# Patient Record
Sex: Female | Born: 1980 | Hispanic: Yes | Marital: Married | Smoking: Never smoker
Health system: Southern US, Community
[De-identification: ages and names within clinical notes are randomized; demographics above are authoritative.]

## PROBLEM LIST (undated history)

## (undated) ENCOUNTER — Inpatient Hospital Stay (HOSPITAL_COMMUNITY): Payer: Self-pay

## (undated) DIAGNOSIS — O139 Gestational [pregnancy-induced] hypertension without significant proteinuria, unspecified trimester: Secondary | ICD-10-CM

## (undated) DIAGNOSIS — I10 Essential (primary) hypertension: Secondary | ICD-10-CM

## (undated) DIAGNOSIS — O24419 Gestational diabetes mellitus in pregnancy, unspecified control: Secondary | ICD-10-CM

## (undated) HISTORY — PX: NO PAST SURGERIES: SHX2092

---

## 2006-09-05 ENCOUNTER — Inpatient Hospital Stay (HOSPITAL_COMMUNITY): Admission: AD | Admit: 2006-09-05 | Discharge: 2006-09-07 | Payer: Self-pay | Admitting: Gynecology

## 2008-03-09 ENCOUNTER — Inpatient Hospital Stay (HOSPITAL_COMMUNITY): Admission: AD | Admit: 2008-03-09 | Discharge: 2008-03-09 | Payer: Self-pay | Admitting: Obstetrics & Gynecology

## 2008-03-09 ENCOUNTER — Ambulatory Visit: Payer: Self-pay | Admitting: Obstetrics and Gynecology

## 2008-03-17 ENCOUNTER — Ambulatory Visit: Payer: Self-pay | Admitting: Obstetrics & Gynecology

## 2009-12-21 ENCOUNTER — Inpatient Hospital Stay (HOSPITAL_COMMUNITY)
Admission: RE | Admit: 2009-12-21 | Discharge: 2009-12-23 | Payer: Self-pay | Source: Home / Self Care | Admitting: Obstetrics

## 2010-04-17 LAB — CBC
HCT: 37.2 % (ref 36.0–46.0)
MCH: 26.9 pg (ref 26.0–34.0)
MCH: 27.3 pg (ref 26.0–34.0)
MCHC: 32.9 g/dL (ref 30.0–36.0)
MCHC: 33.2 g/dL (ref 30.0–36.0)
MCV: 81.8 fL (ref 78.0–100.0)
MCV: 82.2 fL (ref 78.0–100.0)
Platelets: 230 10*3/uL (ref 150–400)
RBC: 4.11 MIL/uL (ref 3.87–5.11)
RDW: 15.1 % (ref 11.5–15.5)
WBC: 8.9 10*3/uL (ref 4.0–10.5)

## 2010-05-22 LAB — URINALYSIS, ROUTINE W REFLEX MICROSCOPIC
Bilirubin Urine: NEGATIVE
Ketones, ur: NEGATIVE mg/dL
Nitrite: NEGATIVE
Protein, ur: NEGATIVE mg/dL
pH: 6 (ref 5.0–8.0)

## 2010-05-22 LAB — WET PREP, GENITAL: Clue Cells Wet Prep HPF POC: NONE SEEN

## 2010-05-22 LAB — CBC
HCT: 40 % (ref 36.0–46.0)
MCV: 85 fL (ref 78.0–100.0)
Platelets: 279 10*3/uL (ref 150–400)
RDW: 13.6 % (ref 11.5–15.5)

## 2010-05-22 LAB — POCT PREGNANCY, URINE: Preg Test, Ur: NEGATIVE

## 2010-05-22 LAB — GC/CHLAMYDIA PROBE AMP, GENITAL: Chlamydia, DNA Probe: NEGATIVE

## 2010-05-22 LAB — URINE MICROSCOPIC-ADD ON

## 2010-11-19 LAB — CBC
Platelets: 209
RBC: 3.69 — ABNORMAL LOW
RDW: 14.8 — ABNORMAL HIGH
WBC: 16.7 — ABNORMAL HIGH

## 2013-02-04 NOTE — L&D Delivery Note (Signed)
Delivery Note At 11:19 PM a viable female was delivered via  (Presentation: ;  ).  APGAR: , ; weight .   Placenta status: Intact, Manual removal.  Cord:  with the following complications: .  Cord pH: not done  Anesthesia: Epidural  Episiotomy:  Lacerations:  Suture Repair: 2.0 Est. Blood Loss (mL):   Mom to postpartum.  Baby to Couplet care / Skin to Skin.  MARSHALL,BERNARD A 11/02/2013, 11:31 PM

## 2013-05-08 ENCOUNTER — Encounter (HOSPITAL_COMMUNITY): Payer: Self-pay | Admitting: *Deleted

## 2013-05-08 ENCOUNTER — Inpatient Hospital Stay (HOSPITAL_COMMUNITY)
Admission: AD | Admit: 2013-05-08 | Discharge: 2013-05-09 | Disposition: A | Discharge status: Home / Self Care | Payer: Medicaid Other | Source: Ambulatory Visit | Attending: Obstetrics | Admitting: Obstetrics

## 2013-05-08 DIAGNOSIS — O2390 Unspecified genitourinary tract infection in pregnancy, unspecified trimester: Secondary | ICD-10-CM | POA: Insufficient documentation

## 2013-05-08 DIAGNOSIS — H9209 Otalgia, unspecified ear: Secondary | ICD-10-CM | POA: Insufficient documentation

## 2013-05-08 DIAGNOSIS — B373 Candidiasis of vulva and vagina: Secondary | ICD-10-CM

## 2013-05-08 DIAGNOSIS — B3731 Acute candidiasis of vulva and vagina: Secondary | ICD-10-CM | POA: Insufficient documentation

## 2013-05-08 DIAGNOSIS — H9202 Otalgia, left ear: Secondary | ICD-10-CM

## 2013-05-08 NOTE — MAU Note (Signed)
Pt reports vaginal itching, white discharge. Pain with urination.

## 2013-05-09 ENCOUNTER — Encounter (HOSPITAL_COMMUNITY): Payer: Self-pay | Admitting: *Deleted

## 2013-05-09 LAB — URINE MICROSCOPIC-ADD ON

## 2013-05-09 LAB — WET PREP, GENITAL: TRICH WET PREP: NONE SEEN

## 2013-05-09 LAB — URINALYSIS, ROUTINE W REFLEX MICROSCOPIC
BILIRUBIN URINE: NEGATIVE
Glucose, UA: NEGATIVE mg/dL
KETONES UR: NEGATIVE mg/dL
NITRITE: NEGATIVE
Protein, ur: 30 mg/dL — AB
Specific Gravity, Urine: 1.025 (ref 1.005–1.030)
UROBILINOGEN UA: 0.2 mg/dL (ref 0.0–1.0)
pH: 6 (ref 5.0–8.0)

## 2013-05-09 LAB — OB RESULTS CONSOLE GC/CHLAMYDIA
Chlamydia: NEGATIVE
GC PROBE AMP, GENITAL: NEGATIVE

## 2013-05-09 MED ORDER — FLUCONAZOLE 150 MG PO TABS
150.0000 mg | ORAL_TABLET | Freq: Every day | ORAL | Status: DC
  Administered 2013-05-09: 150 mg via ORAL
  Filled 2013-05-09: qty 1

## 2013-05-09 MED ORDER — FLUCONAZOLE 150 MG PO TABS: 150.0000 mg | ORAL_TABLET | Freq: Every day | ORAL | Status: DC

## 2013-05-09 NOTE — Discharge Instructions (Signed)
Infección por cándida en adultos  (Candida Infection, Adult)  Una infección por Cándida (también llamado infección por hongos levaduriformes, o infección por Monilia) es un crecimiento excesivo de hongos que puede ocurrir en cualquier parte del cuerpo. Una infección por cándida comúnmente ocurre en las zonas más calientes y húmedas del cuerpo. Usualmente, la infección permanece localizada pero puede diseminarse y convertirse en una infección sistémica. Una infección por cándida puede ser signo de un trastorno más grave como diabetes, leucemia, o SIDA.  Una infección por cándida puede ocurrir tanto en hombres como en mujeres. En mujeres, la vaginitis Cándida es una infección vaginal. Se trata de una de las causas más frecuentes de la vaginitis. Los hombres no suelen tener síntomas hasta que se le aparecen otros problemas. Pueden descubrir que tienen una infección por hongos porque su compañera sexual los tiene. Es más probable que los hombres no circuncisos adquieran una infección por hongo que aquellos que están circuncindados. Esto se debe a que el glande no circuncidado no está expuesto al aire y no se mantiene tan seco como un glande circuncidado. Los adultos mayores pueden desarrollar infecciones por cándida en la zona de la dentadura.  CAUSAS  Mujeres  · Antibióticos.  · Medicamento con esteroides durante mucho tiempo.  · Tener sobrepeso (obesidad).  · Diabetes.  · Sistema inmune deficiente.  · Ciertas enfermedades.  · Medicamentos inmunosupresores para pacientes con trasplante de órganos.  · Quimioterapia.  · El embarazo.  · Menstruación.  · Estrés o fatiga.  · Consumo de drogas de forma intravenosa.  · Anticonceptivos orales.  · Utilizar ropa ajustada en la zona de la entrepierna.  · Contagio a partir de un compañero sexual que ya tiene la infección.  · Los espermicidas.  · Catéteres intravenosos, urinarios, u de otro tipo.  Hombres  · Contagio a partir de una compañera sexual que ya tiene la  infección.  · Tener sexo oral o anal con una persona que tiene la infección.  · Los espermicidas.  · Diabetes.  · Antibióticos.  · Sistema inmune deficiente.  · Medicamentos que suprimen el sistema inmune.  · El uso de drogas por vía intravenosa.  · Intravenosa, urinarias o catéteres otros.  SÍNTOMAS  Mujeres  · Flujo vaginal espeso y blanco.  · Picazón vaginal.  · Enrojecimiento e hinchazón en la zona de la vagina.  · Irritación de los labios de la vagina y perineo.  · Úlceras en labios vaginales y perineo.  · Relaciones sexuales dolorosas.  · Bajo nivel de azúcar en la sangre (hipoglucemia).  · Dolor al orinar.  · Infecciones en la vejiga.  · Problemas intestinales como constipación, indigestión, mal aliento, hinchazón, gases, diarrea o heces blandas.  Hombres  · Primero los hombres desarrollan problemas intestinales como constipación, indigestión, mal aliento, hinchazón, gases, diarrea o heces blandas.  · Piel del pene seca y quebradiza con picazón o molestias.  · Prurito de la ingle.  · Piel seca y escamosa.  · Pie de atleta.  · Hipoglucemia.  DIAGNÓSTICO  Mujeres  · Se revisa el historial y se realiza un análisis.  · La supuración se observa con un microscopio.  · Deberá tomarse un cultivo de la secreción.  Hombres  · Se revisa el historial y se realiza un análisis.  · Se observará cualquier secreción proveniente del pene y la piel quebrada en el microscopio y se realizará un cultivo.  · Podrán realizarle cultivos de heces.  TRATAMIENTO  Mujeres  · Supositorios y cremas   antifúngicos vaginales.  · Cremas medicadas para disminuir la picazón e irritación de la zona externa de la vagina.  · Aplique una bolsa caliente en la zona del perineo para disminuir molestias o inflamación.  · Medicamentos antifúngicos orales.  · Supositorios vaginales o cremas medicinales, para infecciones repetidas o recurrentes.  · Lave y seque la zona por completo antes de aplicar la crema.  · La ingesta de yogur con lactobacillus le  ayudará con la prevención y el tratamiento.  · Si las cremas y supositorios no funcionan, la aplicación de solución violeta de genciana puede ayudar.  Hombres  · Cremas anti hongos y medicamentos orales anti hongos.  · A veces el tratamiento deberá continuar por 30 días después de que los síntomas desaparecen para prevenir la recurrencia.  INSTRUCCIONES PARA EL CUIDADO DOMICILIARIO  Mujeres  · Utilice ropa interior de algodón y evite las ropas ajustadas.  · Evite el papel higiénico de color o perfumado y los tampones o toallitas con desodorante.  · No utilice duchas vaginales.  · Mantenga la diabetes bajo control.  · Tome todos los medicamentos tal como se le indicó.  · Mantenga su piel limpia y seca.  · Consuma leche o yogur con lactobacillus de manera regular. Si contrae infecciones por levaduras frecuentes y cree saber cuál es la infección, existen medicamentos de venta libre. Si la infección no parece curarse en 3 días, hable con el médico.  · Comunique a su compañero sexual que padece una infección por hongos. El también necesitará tratamiento, en especial si la infección no desaparece o es recurrente.  Hombres  · Mantenga su piel limpia y seca.  · Mantenga la diabetes bajo control.  · Tome todos los medicamentos tal como se le indicó.  · Comunique a su compañera sexual que sufre una infección por hongos.  SOLICITE ANTENCIÓN MÉDICA SI:  · Los síntomas no desaparecen o empeoran luego de 1 semana de tratamiento.  · Usted tiene una temperatura oral de más de 38,9° C (102° F).  · Presenta dificultades para tragar o para comer durante un tiempo prolongado.  · Aparecen ampollas en los genitales.  · Si aparece una hemorragia vaginal y no es el momento del período.  · Siente dolor abdominal.  · Presentara problemas intestinales como los ya descritos.  · Si se siente débil o desfalleciente.  · Siente dolor al orinar u observa una mayor cantidad de orina.  · Tiene dolor durante las relaciones sexuales.  ASEGÚRESE DE  QUE:  · Comprende esas instrucciones para el alta médica.  · Controlará su enfermedad.  · Pedirá ayuda de inmediato si no mejora o empeora.  Document Released: 01/21/2005 Document Revised: 04/15/2011  ExitCare® Patient Information ©2014 [employer], LLC.

## 2013-05-09 NOTE — MAU Provider Note (Signed)
History     CSN: [id number]  Arrival date and time: 05/08/13 2326   First Provider Initiated Contact with Patient 05/09/13 0055      Chief Complaint  Patient presents with  . Vaginal Discharge  . Vaginal Itching  . Dysuria  . Otalgia   HPI  Ms. Nicole Parker is a 33 y.o. female [id number] at 75106w0d who present with vaginal irritation, vaginal itching, increased white discharge and burning during urination. The symptoms have been going on for 3-4 days. She denies vaginal bleeding currently, is receiving care with Dr. Gaynell FaceMarshall.    OB History   Grav Para Term Preterm Abortions TAB SAB Ect Mult Living   5 3   1   1  3       Past Medical History  Diagnosis Date  . Medical history non-contributory     Past Surgical History  Procedure Laterality Date  . No past surgeries      No family history on file.  History  Substance Use Topics  . Smoking status: Never Smoker   . Smokeless tobacco: Not on file  . Alcohol Use: No    Allergies: No Known Allergies  Prescriptions prior to admission  Medication Sig Dispense Refill  . Prenatal Vit-Fe Fumarate-FA (MULTIVITAMIN-PRENATAL) 27-0.8 MG TABS tablet Take 1 tablet by mouth daily at 12 noon.       Results for orders placed during the hospital encounter of 05/08/13 (from the past 48 hour(s))  URINALYSIS, ROUTINE W REFLEX MICROSCOPIC     Status: Abnormal   Collection Time    05/08/13 11:45 PM      Result Value Ref Range   Color, Urine YELLOW  YELLOW   APPearance HAZY (*) CLEAR   Specific Gravity, Urine 1.025  1.005 - 1.030   pH 6.0  5.0 - 8.0   Glucose, UA NEGATIVE  NEGATIVE mg/dL   Hgb urine dipstick SMALL (*) NEGATIVE   Bilirubin Urine NEGATIVE  NEGATIVE   Ketones, ur NEGATIVE  NEGATIVE mg/dL   Protein, ur 30 (*) NEGATIVE mg/dL   Urobilinogen, UA 0.2  0.0 - 1.0 mg/dL   Nitrite NEGATIVE  NEGATIVE   Leukocytes, UA MODERATE (*) NEGATIVE  URINE MICROSCOPIC-ADD ON     Status: Abnormal   Collection Time     05/08/13 11:45 PM      Result Value Ref Range   Squamous Epithelial / LPF MANY (*) RARE   WBC, UA 3-6  <3 WBC/hpf   RBC / HPF 3-6  <3 RBC/hpf   Bacteria, UA MANY (*) RARE   Urine-Other MUCOUS PRESENT     Comment: YEAST  WET PREP, GENITAL     Status: Abnormal   Collection Time    05/09/13 12:55 AM      Result Value Ref Range   Yeast Wet Prep HPF POC FEW (*) NONE SEEN   Trich, Wet Prep NONE SEEN  NONE SEEN   Clue Cells Wet Prep HPF POC FEW (*) NONE SEEN   WBC, Wet Prep HPF POC MODERATE (*) NONE SEEN   Comment: MANY BACTERIA SEEN    Review of Systems  Gastrointestinal: Negative for nausea, vomiting and abdominal pain.  Genitourinary: Positive for dysuria.       + vaginal discharge thick, white  No vaginal bleeding. No dysuria.    Physical Exam   Blood pressure 152/92, pulse 93, temperature 98.6 F (37 C), temperature source Oral, height 5\' 2"  (1.575 m), weight 106.142 kg (234 lb), SpO2 98.00%.  Physical  Exam  Constitutional: She is oriented to person, place, and time. She appears well-developed and well-nourished. No distress.  HENT:  Head: Normocephalic.  Left Ear: Tympanic membrane normal. Tympanic membrane is not erythematous and not bulging.  Eyes: Pupils are equal, round, and reactive to light.  Neck: Neck supple.  GI: Soft.  Genitourinary:  Speculum exam: Vagina - large amount of thick, white, clumpy discharge, no odor, erythema noted along vaginal wall and cervix Cervix - No contact bleeding Bimanual exam: Cervix closed, no CMT  Uterus non tender, normal size Adnexa non tender, no masses bilaterally GC/Chlam, wet prep done Chaperone present for exam.   Neurological: She is alert and oriented to person, place, and time.  Skin: Skin is warm. She is not diaphoretic.  Psychiatric: Her behavior is normal.    MAU Course  Procedures None  MDM +fht Wet prep GC Diflucan 150 mg given in MAU   Assessment and Plan   A:  Yeast vaginitis in  pregnancy Otalgia   P: Discharge home in stable condition RX: Diflucan to take in 3 days.  Follow up with Dr. Gaynell Face Pelvic rest until yeast infection is gone.   Nicole Hansen [name], NP  05/09/2013, 12:56 AM

## 2013-05-10 LAB — URINE CULTURE

## 2013-05-10 LAB — GC/CHLAMYDIA PROBE AMP
CT PROBE, AMP APTIMA: NEGATIVE
GC Probe RNA: NEGATIVE

## 2013-06-29 ENCOUNTER — Encounter (HOSPITAL_COMMUNITY): Payer: Self-pay

## 2013-06-29 ENCOUNTER — Inpatient Hospital Stay (HOSPITAL_COMMUNITY)
Admission: AD | Admit: 2013-06-29 | Discharge: 2013-06-29 | Disposition: A | Discharge status: Home / Self Care | Payer: Medicaid Other | Source: Ambulatory Visit | Attending: Obstetrics | Admitting: Obstetrics

## 2013-06-29 DIAGNOSIS — B379 Candidiasis, unspecified: Secondary | ICD-10-CM

## 2013-06-29 DIAGNOSIS — B373 Candidiasis of vulva and vagina: Secondary | ICD-10-CM | POA: Insufficient documentation

## 2013-06-29 DIAGNOSIS — B3731 Acute candidiasis of vulva and vagina: Secondary | ICD-10-CM | POA: Insufficient documentation

## 2013-06-29 DIAGNOSIS — R300 Dysuria: Secondary | ICD-10-CM | POA: Insufficient documentation

## 2013-06-29 LAB — WET PREP, GENITAL
Clue Cells Wet Prep HPF POC: NONE SEEN
Trich, Wet Prep: NONE SEEN

## 2013-06-29 LAB — URINALYSIS, ROUTINE W REFLEX MICROSCOPIC
BILIRUBIN URINE: NEGATIVE
Glucose, UA: 500 mg/dL — AB
Hgb urine dipstick: NEGATIVE
KETONES UR: NEGATIVE mg/dL
NITRITE: NEGATIVE
PH: 6 (ref 5.0–8.0)
Protein, ur: NEGATIVE mg/dL
SPECIFIC GRAVITY, URINE: 1.015 (ref 1.005–1.030)
UROBILINOGEN UA: 0.2 mg/dL (ref 0.0–1.0)

## 2013-06-29 LAB — URINE MICROSCOPIC-ADD ON

## 2013-06-29 MED ORDER — FLUCONAZOLE 150 MG PO TABS
150.0000 mg | ORAL_TABLET | Freq: Once | ORAL | Status: AC
  Administered 2013-06-29: 150 mg via ORAL
  Filled 2013-06-29: qty 1

## 2013-06-29 MED ORDER — FLUCONAZOLE 150 MG PO TABS: 150.0000 mg | ORAL_TABLET | Freq: Once | ORAL | Status: DC

## 2013-06-29 NOTE — MAU Provider Note (Signed)
History     CSN: [id number]  Arrival date and time: 06/29/13 3568   First Provider Initiated Contact with Patient 06/29/13 2032      Chief Complaint  Patient presents with  . Dysuria   Dysuria     Nicole Parker is a 33 y.o. [phone] at [redacted]w[redacted]d who presents today with vaginal irritation and burning . She states that she has had the irritation for about 4 days. She states that when she urinates she has increased vaginal burning, and rates that pain 8/10. She was treated for a yeast infection "a couple of months ago". She confirms fetal movement, and denies any UCs.  Past Medical History  Diagnosis Date  . Medical history non-contributory     Past Surgical History  Procedure Laterality Date  . No past surgeries      History reviewed. No pertinent family history.  History  Substance Use Topics  . Smoking status: Never Smoker   . Smokeless tobacco: Never Used  . Alcohol Use: No    Allergies: No Known Allergies  Prescriptions prior to admission  Medication Sig Dispense Refill  . Prenatal Vit-Fe Fumarate-FA (MULTIVITAMIN-PRENATAL) 27-0.8 MG TABS tablet Take 1 tablet by mouth daily at 12 noon.      . fluconazole (DIFLUCAN) 150 MG tablet Take 1 tablet (150 mg total) by mouth daily.  1 tablet  1    Review of Systems  Genitourinary: Positive for dysuria.   Physical Exam   Blood pressure 132/82, pulse 91, temperature 98.7 F (37.1 C), temperature source Oral, resp. rate 20, height 5\' 3"  (1.6 m), weight 110.496 kg (243 lb 9.6 oz).  Physical Exam  Nursing note and vitals reviewed. Constitutional: She is oriented to person, place, and time. She appears well-developed and well-nourished. No distress.  Cardiovascular: Normal rate.   Respiratory: Effort normal.  GI: Soft. Bowel sounds are normal. There is no tenderness.  Genitourinary:   External: no lesion Vagina: small amount of clumpy, white, adherent discharge.  Cervix: pink, smooth, no CMT Uterus: AGA,  FHT with doppler    Neurological: She is alert and oriented to person, place, and time.  Skin: Skin is warm and dry.  Psychiatric: She has a normal mood and affect.    MAU Course  Procedures  Results for orders placed during the hospital encounter of 06/29/13 (from the past 24 hour(s))  URINALYSIS, ROUTINE W REFLEX MICROSCOPIC     Status: Abnormal   Collection Time    06/29/13  7:25 PM      Result Value Ref Range   Color, Urine YELLOW  YELLOW   APPearance CLEAR  CLEAR   Specific Gravity, Urine 1.015  1.005 - 1.030   pH 6.0  5.0 - 8.0   Glucose, UA 500 (*) NEGATIVE mg/dL   Hgb urine dipstick NEGATIVE  NEGATIVE   Bilirubin Urine NEGATIVE  NEGATIVE   Ketones, ur NEGATIVE  NEGATIVE mg/dL   Protein, ur NEGATIVE  NEGATIVE mg/dL   Urobilinogen, UA 0.2  0.0 - 1.0 mg/dL   Nitrite NEGATIVE  NEGATIVE   Leukocytes, UA SMALL (*) NEGATIVE  URINE MICROSCOPIC-ADD ON     Status: None   Collection Time    06/29/13  7:25 PM      Result Value Ref Range   Squamous Epithelial / LPF RARE  RARE   WBC, UA 0-2  <3 WBC/hpf   RBC / HPF 0-2  <3 RBC/hpf   Bacteria, UA RARE  RARE   Urine-Other AMORPHOUS URATES/PHOSPHATES  WET PREP, GENITAL     Status: Abnormal   Collection Time    06/29/13  9:20 PM      Result Value Ref Range   Yeast Wet Prep HPF POC FEW (*) NONE SEEN   Trich, Wet Prep NONE SEEN  NONE SEEN   Clue Cells Wet Prep HPF POC NONE SEEN  NONE SEEN   WBC, Wet Prep HPF POC MANY (*) NONE SEEN     Assessment and Plan   1. Yeast infection    Treated with diflucan here in MAU 2nd trimester danger signs reviewed Return to MAU as needed  Follow-up Information   Follow up with Kathreen CosierMARSHALL,BERNARD A, MD. (As scheduled)    Specialty:  Obstetrics and Gynecology   Contact information:   [address] [place] [id number] [phone]       [name] 06/29/2013, 9:24 PM

## 2013-06-29 NOTE — MAU Note (Signed)
Pt having pain with urination x 4 days. Denies bleeding or discharge.  Pt G5 P3.

## 2013-06-29 NOTE — Discharge Instructions (Signed)
Infección por cándida en adultos  (Candida Infection, Adult)  Una infección por Cándida (también llamado infección por hongos levaduriformes, o infección por Monilia) es un crecimiento excesivo de hongos que puede ocurrir en cualquier parte del cuerpo. Una infección por cándida comúnmente ocurre en las zonas más calientes y húmedas del cuerpo. Usualmente, la infección permanece localizada pero puede diseminarse y convertirse en una infección sistémica. Una infección por cándida puede ser signo de un trastorno más grave como diabetes, leucemia, o SIDA.  Una infección por cándida puede ocurrir tanto en hombres como en mujeres. En mujeres, la vaginitis Cándida es una infección vaginal. Se trata de una de las causas más frecuentes de la vaginitis. Los hombres no suelen tener síntomas hasta que se le aparecen otros problemas. Pueden descubrir que tienen una infección por hongos porque su compañera sexual los tiene. Es más probable que los hombres no circuncisos adquieran una infección por hongo que aquellos que están circuncindados. Esto se debe a que el glande no circuncidado no está expuesto al aire y no se mantiene tan seco como un glande circuncidado. Los adultos mayores pueden desarrollar infecciones por cándida en la zona de la dentadura.  CAUSAS  Mujeres  · Antibióticos.  · Medicamento con esteroides durante mucho tiempo.  · Tener sobrepeso (obesidad).  · Diabetes.  · Sistema inmune deficiente.  · Ciertas enfermedades.  · Medicamentos inmunosupresores para pacientes con trasplante de órganos.  · Quimioterapia.  · El embarazo.  · Menstruación.  · Estrés o fatiga.  · Consumo de drogas de forma intravenosa.  · Anticonceptivos orales.  · Utilizar ropa ajustada en la zona de la entrepierna.  · Contagio a partir de un compañero sexual que ya tiene la infección.  · Los espermicidas.  · Catéteres intravenosos, urinarios, u de otro tipo.  Hombres  · Contagio a partir de una compañera sexual que ya tiene la  infección.  · Tener sexo oral o anal con una persona que tiene la infección.  · Los espermicidas.  · Diabetes.  · Antibióticos.  · Sistema inmune deficiente.  · Medicamentos que suprimen el sistema inmune.  · El uso de drogas por vía intravenosa.  · Intravenosa, urinarias o catéteres otros.  SÍNTOMAS  Mujeres  · Flujo vaginal espeso y blanco.  · Picazón vaginal.  · Enrojecimiento e hinchazón en la zona de la vagina.  · Irritación de los labios de la vagina y perineo.  · Úlceras en labios vaginales y perineo.  · Relaciones sexuales dolorosas.  · Bajo nivel de azúcar en la sangre (hipoglucemia).  · Dolor al orinar.  · Infecciones en la vejiga.  · Problemas intestinales como constipación, indigestión, mal aliento, hinchazón, gases, diarrea o heces blandas.  Hombres  · Primero los hombres desarrollan problemas intestinales como constipación, indigestión, mal aliento, hinchazón, gases, diarrea o heces blandas.  · Piel del pene seca y quebradiza con picazón o molestias.  · Prurito de la ingle.  · Piel seca y escamosa.  · Pie de atleta.  · Hipoglucemia.  DIAGNÓSTICO  Mujeres  · Se revisa el historial y se realiza un análisis.  · La supuración se observa con un microscopio.  · Deberá tomarse un cultivo de la secreción.  Hombres  · Se revisa el historial y se realiza un análisis.  · Se observará cualquier secreción proveniente del pene y la piel quebrada en el microscopio y se realizará un cultivo.  · Podrán realizarle cultivos de heces.  TRATAMIENTO  Mujeres  · Supositorios y cremas   antifúngicos vaginales.  · Cremas medicadas para disminuir la picazón e irritación de la zona externa de la vagina.  · Aplique una bolsa caliente en la zona del perineo para disminuir molestias o inflamación.  · Medicamentos antifúngicos orales.  · Supositorios vaginales o cremas medicinales, para infecciones repetidas o recurrentes.  · Lave y seque la zona por completo antes de aplicar la crema.  · La ingesta de yogur con lactobacillus le  ayudará con la prevención y el tratamiento.  · Si las cremas y supositorios no funcionan, la aplicación de solución violeta de genciana puede ayudar.  Hombres  · Cremas anti hongos y medicamentos orales anti hongos.  · A veces el tratamiento deberá continuar por 30 días después de que los síntomas desaparecen para prevenir la recurrencia.  INSTRUCCIONES PARA EL CUIDADO DOMICILIARIO  Mujeres  · Utilice ropa interior de algodón y evite las ropas ajustadas.  · Evite el papel higiénico de color o perfumado y los tampones o toallitas con desodorante.  · No utilice duchas vaginales.  · Mantenga la diabetes bajo control.  · Tome todos los medicamentos tal como se le indicó.  · Mantenga su piel limpia y seca.  · Consuma leche o yogur con lactobacillus de manera regular. Si contrae infecciones por levaduras frecuentes y cree saber cuál es la infección, existen medicamentos de venta libre. Si la infección no parece curarse en 3 días, hable con el médico.  · Comunique a su compañero sexual que padece una infección por hongos. El también necesitará tratamiento, en especial si la infección no desaparece o es recurrente.  Hombres  · Mantenga su piel limpia y seca.  · Mantenga la diabetes bajo control.  · Tome todos los medicamentos tal como se le indicó.  · Comunique a su compañera sexual que sufre una infección por hongos.  SOLICITE ANTENCIÓN MÉDICA SI:  · Los síntomas no desaparecen o empeoran luego de 1 semana de tratamiento.  · Usted tiene una temperatura oral de más de 38,9° C (102° F).  · Presenta dificultades para tragar o para comer durante un tiempo prolongado.  · Aparecen ampollas en los genitales.  · Si aparece una hemorragia vaginal y no es el momento del período.  · Siente dolor abdominal.  · Presentara problemas intestinales como los ya descritos.  · Si se siente débil o desfalleciente.  · Siente dolor al orinar u observa una mayor cantidad de orina.  · Tiene dolor durante las relaciones sexuales.  ASEGÚRESE DE  QUE:  · Comprende esas instrucciones para el alta médica.  · Controlará su enfermedad.  · Pedirá ayuda de inmediato si no mejora o empeora.  Document Released: 01/21/2005 Document Revised: 04/15/2011  ExitCare® Patient Information ©2014 [employer], LLC.

## 2013-06-30 LAB — GC/CHLAMYDIA PROBE AMP
CT Probe RNA: NEGATIVE
GC PROBE AMP APTIMA: NEGATIVE

## 2013-06-30 LAB — URINE CULTURE
COLONY COUNT: NO GROWTH
Culture: NO GROWTH

## 2013-06-30 LAB — GLUCOSE, CAPILLARY: GLUCOSE-CAPILLARY: 111 mg/dL — AB (ref 70–99)

## 2013-07-17 ENCOUNTER — Inpatient Hospital Stay (HOSPITAL_COMMUNITY): Payer: Medicaid Other

## 2013-07-17 ENCOUNTER — Encounter (HOSPITAL_COMMUNITY): Payer: Self-pay | Admitting: *Deleted

## 2013-07-17 ENCOUNTER — Inpatient Hospital Stay (HOSPITAL_COMMUNITY)
Admission: AD | Admit: 2013-07-17 | Discharge: 2013-07-17 | Disposition: A | Discharge status: Home / Self Care | Payer: Medicaid Other | Source: Ambulatory Visit | Attending: Obstetrics | Admitting: Obstetrics

## 2013-07-17 DIAGNOSIS — K5900 Constipation, unspecified: Secondary | ICD-10-CM | POA: Insufficient documentation

## 2013-07-17 DIAGNOSIS — N949 Unspecified condition associated with female genital organs and menstrual cycle: Secondary | ICD-10-CM

## 2013-07-17 DIAGNOSIS — O99891 Other specified diseases and conditions complicating pregnancy: Secondary | ICD-10-CM | POA: Insufficient documentation

## 2013-07-17 DIAGNOSIS — O9989 Other specified diseases and conditions complicating pregnancy, childbirth and the puerperium: Principal | ICD-10-CM

## 2013-07-17 DIAGNOSIS — R109 Unspecified abdominal pain: Secondary | ICD-10-CM | POA: Insufficient documentation

## 2013-07-17 LAB — URINALYSIS, ROUTINE W REFLEX MICROSCOPIC
BILIRUBIN URINE: NEGATIVE
GLUCOSE, UA: NEGATIVE mg/dL
KETONES UR: NEGATIVE mg/dL
LEUKOCYTES UA: NEGATIVE
NITRITE: NEGATIVE
Protein, ur: NEGATIVE mg/dL
SPECIFIC GRAVITY, URINE: 1.02 (ref 1.005–1.030)
Urobilinogen, UA: 0.2 mg/dL (ref 0.0–1.0)
pH: 6 (ref 5.0–8.0)

## 2013-07-17 LAB — URINE MICROSCOPIC-ADD ON

## 2013-07-17 MED ORDER — CYCLOBENZAPRINE HCL 10 MG PO TABS
10.0000 mg | ORAL_TABLET | Freq: Once | ORAL | Status: AC
  Administered 2013-07-17: 10 mg via ORAL
  Filled 2013-07-17: qty 1

## 2013-07-17 MED ORDER — OXYCODONE-ACETAMINOPHEN 5-325 MG PO TABS
1.0000 | ORAL_TABLET | Freq: Once | ORAL | Status: AC
  Administered 2013-07-17: 1 via ORAL
  Filled 2013-07-17: qty 1

## 2013-07-17 MED ORDER — CYCLOBENZAPRINE HCL 10 MG PO TABS: 10.0000 mg | ORAL_TABLET | Freq: Two times a day (BID) | ORAL | Status: DC | PRN

## 2013-07-17 MED ORDER — ACETAMINOPHEN-CODEINE #3 300-30 MG PO TABS: 1.0000 | ORAL_TABLET | Freq: Four times a day (QID) | ORAL | Status: DC | PRN

## 2013-07-17 MED ORDER — POLYETHYLENE GLYCOL 3350 17 GM/SCOOP PO POWD: ORAL | Status: DC

## 2013-07-17 NOTE — Discharge Instructions (Signed)
Constipacin (Constipation) Se llama constipacin cuando:   Elimina heces (mueve el intestino) menos de 3 veces por semana.  Tiene dificultad para mover el intestino.  Las heces son secas y duras o son ms grandes que lo normal. CUIDADOS EN EL HOGAR   Consuma ms fibra que se encuentra en frutas, verduras y granos enteros como arroz integral y frijoles.  Consuma menos alimentos ricos en grasas y azcar. Estos incluyen patatas fritas, hamburguesas, galletas, dulces y refrescos.  Si no consume suficientes alimentos ricos en fibras, tome productos que tengan agregado de fibra (suplementos).  Beba gran cantidad de lquido para mantener el pis (orina) de tono claro o amarillo plido.  Vaya al bao cuando sienta la necesidad de ir. No espere.  Slo debe tomar los [employer] Companymedicamentos como se los han recetado. No tome medicamentos que le ayuden a Licensed conveyancermover el intestino (laxantes) sin antes consultarlo con su mdico.  Haga ejercicio en forma regular, o como lo indique su mdico. SOLICITE AYUDA DE INMEDIATO SI:   Observa sangre brillante en las heces (materia fecal).  El estreimiento dura ms de 4 das o Collings Lakesempeora.  Siente dolor en el vientre (abdomen) o en el ano (recto).  Las heces son delgadas (como un lpiz).  Pierde peso de Spr[place]era inexplicable. ASEGRESE DE QUE:   Comprende estas instrucciones.  Controlar su enfermedad.  Solicitar ayuda de inmediato si no mejora o si empeora. Document Released: 02/23/2010 Document Revised: 04/15/2011 [facility] Patient Information 2014 [place], [place]. Dolor abdominal durante el embarazo (Abdominal Pain During Pregnancy) El dolor de vientre (abdominal) es habitual durante el embarazo. Generalmente no se trata de un problema grave. Otras veces puede ser un signo de que algo no anda bien. Siempre comunquese con su mdico si tiene dolor abdominal. CUIDADOS EN EL HOGAR Controle el dolor para ver si hay cambios. Las indicaciones que siguen pueden ayudarla  a sentirse mejor:  Hospital doctorotenga sexo (relaciones sexuales) ni se coloque nada dentro de la vagina hasta que se sienta mejor.  Haga reposo hasta que el dolor se calme.  Si siente ganas de vomitar (nuseas ) beba lquidos claros. No consuma alimentos slidos hasta que se sienta mejor.  Slo tome los medicamentos que le haya indicado su mdico.  Cumpla con las visitas al mdico segn las indicaciones. SOLICITE AYUDA DE INMEDIATO SI:   Tiene un sangrado, pierde lquido o elimina trozos de tejido por la vagina.  Siente ms dolor o clicos.  Comienza a vomitar.  Siente dolor al orinar u observa sangre en la orina.  Tiene fiebre.  No siente que el beb se mueva mucho.  Se siente muy dbil o cree que va a desmayarse.  Tiene dificultad para respirar con o sin dolor en el vientre.  Siente un dolor de cabeza muy intenso y Engineer, miningdolor en el vientre.  Observa que sale un lquido por la vagina y tiene dolor abdominal.  La materia fecal es lquida (diarrea).  El dolor en el viente no desaparece, o empeora, luego de hacer reposo. ASEGRESE DE QUE:   Comprende estas instrucciones.  Controlar su afeccin.  Recibir ayuda de inmediato si no mejora o si empeora. Document Released: 10/03/2010 Document Revised: 09/23/2012 [facility]esExitCare Patient Information 2014 [place]ExitCare, [place]LLC.

## 2013-07-17 NOTE — MAU Provider Note (Signed)
History     CSN: [id number]  Arrival date and time: 07/17/13 1743   First Provider Initiated Contact with Patient 07/17/13 1808      Chief Complaint  Patient presents with  . Abdominal Pain   HPI 33 y.o. [id number] at 1550w6d with lower left side pain since yesterday. Pain started suddenly with moving from sitting to standing very quickly. Pain has been constant, but worsens with position change, walking, moving from sitting to standing. Uncomplicated prenatal course so far. She denies vaginal bleeding.   Past Medical History  Diagnosis Date  . Medical history non-contributory     Past Surgical History  Procedure Laterality Date  . No past surgeries      History reviewed. No pertinent family history.  History  Substance Use Topics  . Smoking status: Never Smoker   . Smokeless tobacco: Never Used  . Alcohol Use: No    Allergies: No Known Allergies  Prescriptions prior to admission  Medication Sig Dispense Refill  . fluconazole (DIFLUCAN) 150 MG tablet Take 1 tablet (150 mg total) by mouth once.  1 tablet  0  . Prenatal Vit-Fe Fumarate-FA (MULTIVITAMIN-PRENATAL) 27-0.8 MG TABS tablet Take 1 tablet by mouth daily at 12 noon.        Review of Systems  Constitutional: Negative.   Respiratory: Negative.   Cardiovascular: Negative.   Gastrointestinal: Positive for abdominal pain. Negative for nausea, vomiting, diarrhea and constipation.  Genitourinary: Negative for dysuria, urgency, frequency, hematuria and flank pain.       Negative for vaginal bleeding, cramping/contractions  Musculoskeletal: Negative.   Neurological: Negative.   Psychiatric/Behavioral: Negative.    Physical Exam   Blood pressure 146/104, pulse 111, temperature 98.1 F (36.7 C), temperature source Oral, resp. rate 24, height 5\' 3"  (1.6 m), weight 243 lb (110.224 kg).  Physical Exam  Nursing note and vitals reviewed. Constitutional: She is oriented to person, place, and time. She appears  well-developed and well-nourished. She appears distressed (tearful, uncomfortable).  Cardiovascular: Normal rate.   Respiratory: Effort normal.  GI: Soft. She exhibits no mass. There is tenderness (left lower side very tender). There is no rebound and no guarding.  Very large, pendulous abdomen  Genitourinary: Enlarged: gravid, uterus soft.  Musculoskeletal: Normal range of motion.  Neurological: She is alert and oriented to person, place, and time.  Skin: Skin is warm and dry.  Psychiatric: She has a normal mood and affect.   + FHR 160 MAU Course  Procedures  Results for orders placed during the hospital encounter of 07/17/13 (from the past 24 hour(s))  URINALYSIS, ROUTINE W REFLEX MICROSCOPIC     Status: Abnormal   Collection Time    07/17/13  6:03 PM      Result Value Ref Range   Color, Urine YELLOW  YELLOW   APPearance CLEAR  CLEAR   Specific Gravity, Urine 1.020  1.005 - 1.030   pH 6.0  5.0 - 8.0   Glucose, UA NEGATIVE  NEGATIVE mg/dL   Hgb urine dipstick TRACE (*) NEGATIVE   Bilirubin Urine NEGATIVE  NEGATIVE   Ketones, ur NEGATIVE  NEGATIVE mg/dL   Protein, ur NEGATIVE  NEGATIVE mg/dL   Urobilinogen, UA 0.2  0.0 - 1.0 mg/dL   Nitrite NEGATIVE  NEGATIVE   Leukocytes, UA NEGATIVE  NEGATIVE  URINE MICROSCOPIC-ADD ON     Status: Abnormal   Collection Time    07/17/13  6:03 PM      Result Value Ref Range   Squamous Epithelial /  LPF FEW (*) RARE   RBC / HPF 0-2  <3 RBC/hpf   Urine-Other MUCOUS PRESENT     Percocet 5/325 and Flexeril 10 mg in MAU for pain TOCO shows no contractions One more Percocet given prior to US   FRAZIER,NATALIE 07/17/2013, 6:21 PM   Care assumed from CNM while patient is in US.  Patient continues to states pain is mostly unchanged.  She endorses some recent issues with constipation.  US results are WNL. Normal cervical length, AFI, FHR.   Assessment and Plan  A: Constipation Round ligament pain  P: Discharge home Rx for Miralax,  Flexeril and Tylenol #3 given to patient Patient advised to call Dr. Gaynell FaceMarshall if pain has not improved by Monday Discussed use of abdominal binder for round ligament pain Patient may return to MAU as needed or if her condition were to change or worsen  Freddi StarrJulie N Ethier, PA-C 07/17/2013 9:41 PM

## 2013-07-17 NOTE — MAU Note (Signed)
Patient presents with complaint of lower left abdominal pain since yesterday. 

## 2013-08-25 ENCOUNTER — Other Ambulatory Visit: Payer: Self-pay

## 2013-09-02 ENCOUNTER — Ambulatory Visit (HOSPITAL_COMMUNITY)
Admission: RE | Admit: 2013-09-02 | Discharge: 2013-09-02 | Disposition: A | Discharge status: Home / Self Care | Payer: Medicaid Other | Source: Ambulatory Visit | Attending: Obstetrics | Admitting: Obstetrics

## 2013-09-02 ENCOUNTER — Encounter: Payer: Medicaid Other | Attending: Obstetrics | Admitting: *Deleted

## 2013-09-02 DIAGNOSIS — O99810 Abnormal glucose complicating pregnancy: Secondary | ICD-10-CM | POA: Insufficient documentation

## 2013-09-02 DIAGNOSIS — Z713 Dietary counseling and surveillance: Secondary | ICD-10-CM | POA: Insufficient documentation

## 2013-09-02 NOTE — Progress Notes (Signed)
  Patient was seen on 09/02/13 for Gestational Diabetes self-management . Patient presents with her two young boys and spanish interpreter. Patient was emotional, overwhelmed and tearful at the end of the visit.   The following learning objectives were met by the patient :   States the definition of Gestational Diabetes  States why dietary management is important in controlling blood glucose  Describes the effects of carbohydrates on blood glucose levels  Demonstrates ability to create a balanced meal plan  Demonstrates carbohydrate counting   States when to check blood glucose levels  Demonstrates proper blood glucose monitoring techniques  States the effect of stress and exercise on blood glucose levels  States the importance of limiting caffeine and abstaining from alcohol and smoking  Plan:  Aim for 2 Carb Choices per meal (30 grams) +/- 1 either way for breakfast Aim for 3 Carb Choices per meal (45 grams) +/- 1 either way from lunch and dinner Aim for 1-2 Carbs per snack Begin reading food labels for Total Carbohydrate and sugar grams of foods Consider  increasing your activity level by walking daily as tolerated Begin checking BG before breakfast and 1-2 hours after first bit of breakfast, lunch and dinner after  as directed by MD  Take medication  as directed by MD  Blood glucose monitor given: Accu-chek aviva Plus until she is able to obtain as noted below Lot: [id number] Exp: 07/05/14  Patient is uninsured. Advised to go to [facility] to purchase ReliOn glucometer, test strips and lancets Blood glucose reading: 16m/dl  Patient instructed to monitor glucose levels: FBS: 60 - <90 2 hour: <120  Patient received the following handouts:  Nutrition Diabetes and Pregnancy  Carbohydrate Counting List  Meal Planning worksheet  Patient will be seen for follow-up as needed.

## 2013-10-01 ENCOUNTER — Other Ambulatory Visit (HOSPITAL_COMMUNITY): Payer: Self-pay | Admitting: Obstetrics

## 2013-10-01 DIAGNOSIS — E108 Type 1 diabetes mellitus with unspecified complications: Secondary | ICD-10-CM

## 2013-10-04 ENCOUNTER — Inpatient Hospital Stay (HOSPITAL_COMMUNITY)
Admission: AD | Admit: 2013-10-04 | Discharge: 2013-10-05 | Disposition: A | Discharge status: Home / Self Care | Payer: Self-pay | Source: Ambulatory Visit | Attending: Obstetrics | Admitting: Obstetrics

## 2013-10-04 ENCOUNTER — Encounter (HOSPITAL_COMMUNITY): Payer: Self-pay | Admitting: *Deleted

## 2013-10-04 DIAGNOSIS — O9989 Other specified diseases and conditions complicating pregnancy, childbirth and the puerperium: Secondary | ICD-10-CM

## 2013-10-04 DIAGNOSIS — O99891 Other specified diseases and conditions complicating pregnancy: Secondary | ICD-10-CM | POA: Insufficient documentation

## 2013-10-04 DIAGNOSIS — M549 Dorsalgia, unspecified: Secondary | ICD-10-CM | POA: Insufficient documentation

## 2013-10-04 HISTORY — DX: Gestational diabetes mellitus in pregnancy, unspecified control: O24.419

## 2013-10-04 LAB — URINALYSIS, ROUTINE W REFLEX MICROSCOPIC
Bilirubin Urine: NEGATIVE
Glucose, UA: NEGATIVE mg/dL
Ketones, ur: NEGATIVE mg/dL
NITRITE: NEGATIVE
Protein, ur: NEGATIVE mg/dL
Specific Gravity, Urine: 1.015 (ref 1.005–1.030)
Urobilinogen, UA: 0.2 mg/dL (ref 0.0–1.0)
pH: 7 (ref 5.0–8.0)

## 2013-10-04 LAB — URINE MICROSCOPIC-ADD ON

## 2013-10-04 MED ORDER — CYCLOBENZAPRINE HCL 10 MG PO TABS
10.0000 mg | ORAL_TABLET | Freq: Once | ORAL | Status: AC
  Administered 2013-10-04: 10 mg via ORAL
  Filled 2013-10-04: qty 1

## 2013-10-04 NOTE — MAU Provider Note (Signed)
History     CSN: [id number]  Arrival date and time: 10/04/13 2112   First Provider Initiated Contact with Patient 10/04/13 2234      No chief complaint on file.  HPI  Nicole Parker is a 33 y.o. a [id number] at [redacted]w[redacted]d who presents today with back pain, pelvic pressure and urinary frequency. She states that she also feels a contraction about every 35-40 mins. She denies any VB or LOF. She confirms fetal movement. She has an appointment with Dr. Gaynell Face tomorrow.   Past Medical History  Diagnosis Date  . Gestational diabetes     Past Surgical History  Procedure Laterality Date  . No past surgeries      History reviewed. No pertinent family history.  History  Substance Use Topics  . Smoking status: Never Smoker   . Smokeless tobacco: Never Used  . Alcohol Use: No    Allergies: No Known Allergies  Prescriptions prior to admission  Medication Sig Dispense Refill  . acetaminophen (TYLENOL) 325 MG tablet Take 325 mg by mouth every 6 (six) hours as needed for headache.      . Prenatal Vit-Fe Fumarate-FA (PRENATAL MULTIVITAMIN) TABS tablet Take 1 tablet by mouth at bedtime.        ROS Physical Exam   Blood pressure 128/86, pulse 88, temperature 98.7 F (37.1 C), temperature source Oral, resp. rate 20, height  (1.575 m), weight 116.574 kg (257 lb).  Physical Exam  Nursing note and vitals reviewed. Constitutional: She is oriented to person, place, and time. She appears well-developed and well-nourished. No distress.  Cardiovascular: Normal rate.   Respiratory: Effort normal.  GI: Soft. There is no tenderness. There is no rebound.  Genitourinary:   Cervix: closed/thick/high/soft   Neurological: She is alert and oriented to person, place, and time.  Skin: Skin is warm and dry.  Psychiatric: She has a normal mood and affect.   FHT: 140, moderate with 15x15 accels, no decels Toco: irregular UCs  MAU Course  Procedures  Results for orders placed during the  hospital encounter of 10/04/13 (from the past 24 hour(s))  URINALYSIS, ROUTINE W REFLEX MICROSCOPIC     Status: Abnormal   Collection Time    10/04/13  9:34 PM      Result Value Ref Range   Color, Urine YELLOW  YELLOW   APPearance HAZY (*) CLEAR   Specific Gravity, Urine 1.015  1.005 - 1.030   pH 7.0  5.0 - 8.0   Glucose, UA NEGATIVE  NEGATIVE mg/dL   Hgb urine dipstick SMALL (*) NEGATIVE   Bilirubin Urine NEGATIVE  NEGATIVE   Ketones, ur NEGATIVE  NEGATIVE mg/dL   Protein, ur NEGATIVE  NEGATIVE mg/dL   Urobilinogen, UA 0.2  0.0 - 1.0 mg/dL   Nitrite NEGATIVE  NEGATIVE   Leukocytes, UA LARGE (*) NEGATIVE  URINE MICROSCOPIC-ADD ON     Status: Abnormal   Collection Time    10/04/13  9:34 PM      Result Value Ref Range   Squamous Epithelial / LPF MANY (*) RARE   WBC, UA 11-20  <3 WBC/hpf   RBC / HPF 3-6  <3 RBC/hpf   Bacteria, UA FEW (*) RARE   Urine-Other MUCOUS PRESENT     2331: Patient states that her pain is now a 4/10 after having flexeril. It was 8-10/10 before.  Assessment and Plan   1. Back pain affecting [id number] trimester    Comfort measures reviewed PTL precautions Return to  MAU as needed  Follow-up Information   Follow up with Kathreen Cosier, MD. (As scheduled)    Specialty:  Obstetrics and Gynecology   Contact information:   [address] [place] [address] [phone]        Tawnya Crook 10/04/2013, 10:43 PM

## 2013-10-04 NOTE — Discharge Instructions (Signed)
Third Trimester of Pregnancy The third trimester is from week 29 through week 42, months 7 through 9. The third trimester is a time when the fetus is growing rapidly. At the end of the ninth month, the fetus is about 20 inches in length and weighs 6-10 pounds.  BODY CHANGES Your body goes through many changes during pregnancy. The changes vary from woman to woman.   Your weight will continue to increase. You can expect to gain 25-35 pounds (11-16 kg) by the end of the pregnancy.  You may begin to get stretch marks on your hips, abdomen, and breasts.  You may urinate more often because the fetus is moving lower into your pelvis and pressing on your bladder.  You may develop or continue to have heartburn as a result of your pregnancy.  You may develop constipation because certain hormones are causing the muscles that push waste through your intestines to slow down.  You may develop hemorrhoids or swollen, bulging veins (varicose veins).  You may have pelvic pain because of the weight gain and pregnancy hormones relaxing your joints between the bones in your pelvis. Backaches may result from overexertion of the muscles supporting your posture.  You may have changes in your hair. These can include thickening of your hair, rapid growth, and changes in texture. Some women also have hair loss during or after pregnancy, or hair that feels dry or thin. Your hair will most likely return to normal after your baby is born.  Your breasts will continue to grow and be tender. A yellow discharge may leak from your breasts called colostrum.  Your belly button may stick out.  You may feel short of breath because of your expanding uterus.  You may notice the fetus "dropping," or moving lower in your abdomen.  You may have a bloody mucus discharge. This usually occurs a few days to a week before labor begins.  Your cervix becomes thin and soft (effaced) near your due date. WHAT TO EXPECT AT YOUR PRENATAL  EXAMS  You will have prenatal exams every 2 weeks until week 36. Then, you will have weekly prenatal exams. During a routine prenatal visit:  You will be weighed to make sure you and the fetus are growing normally.  Your blood pressure is taken.  Your abdomen will be measured to track your baby's growth.  The fetal heartbeat will be listened to.  Any test results from the previous visit will be discussed.  You may have a cervical check near your due date to see if you have effaced. At around 36 weeks, your caregiver will check your cervix. At the same time, your caregiver will also perform a test on the secretions of the vaginal tissue. This test is to determine if a type of bacteria, Group B streptococcus, is present. Your caregiver will explain this further. Your caregiver may ask you:  What your birth plan is.  How you are feeling.  If you are feeling the baby move.  If you have had any abnormal symptoms, such as leaking fluid, bleeding, severe headaches, or abdominal cramping.  If you have any questions. Other tests or screenings that may be performed during your third trimester include:  Blood tests that check for low iron levels (anemia).  Fetal testing to check the health, activity level, and growth of the fetus. Testing is done if you have certain medical conditions or if there are problems during the pregnancy. FALSE LABOR You may feel small, irregular contractions that   eventually go away. These are called Braxton Hicks contractions, or false labor. Contractions may last for hours, days, or even weeks before true labor sets in. If contractions come at regular intervals, intensify, or become painful, it is best to be seen by your caregiver.  SIGNS OF LABOR   Menstrual-like cramps.  Contractions that are 5 minutes apart or less.  Contractions that start on the top of the uterus and spread down to the lower abdomen and back.  A sense of increased pelvic pressure or back  pain.  A watery or bloody mucus discharge that comes from the vagina. If you have any of these signs before the 37th week of pregnancy, call your caregiver right away. You need to go to the hospital to get checked immediately. HOME CARE INSTRUCTIONS   Avoid all smoking, herbs, alcohol, and unprescribed drugs. These chemicals affect the formation and growth of the baby.  Follow your caregiver's instructions regarding medicine use. There are medicines that are either safe or unsafe to take during pregnancy.  Exercise only as directed by your caregiver. Experiencing uterine cramps is a good sign to stop exercising.  Continue to eat regular, healthy meals.  Wear a good support bra for breast tenderness.  Do not use hot tubs, steam rooms, or saunas.  Wear your seat belt at all times when driving.  Avoid raw meat, uncooked cheese, cat litter boxes, and soil used by cats. These carry germs that can cause birth defects in the baby.  Take your prenatal vitamins.  Try taking a stool softener (if your caregiver approves) if you develop constipation. Eat more high-fiber foods, such as fresh vegetables or fruit and whole grains. Drink plenty of fluids to keep your urine clear or pale yellow.  Take warm sitz baths to soothe any pain or discomfort caused by hemorrhoids. Use hemorrhoid cream if your caregiver approves.  If you develop varicose veins, wear support hose. Elevate your feet for 15 minutes, 3-4 times a day. Limit salt in your diet.  Avoid heavy lifting, wear low heal shoes, and practice good posture.  Rest a lot with your legs elevated if you have leg cramps or low back pain.  Visit your dentist if you have not gone during your pregnancy. Use a soft toothbrush to brush your teeth and be gentle when you floss.  A sexual relationship may be continued unless your caregiver directs you otherwise.  Do not travel far distances unless it is absolutely necessary and only with the approval  of your caregiver.  Take prenatal classes to understand, practice, and ask questions about the labor and delivery.  Make a trial run to the hospital.  Pack your hospital bag.  Prepare the baby's nursery.  Continue to go to all your prenatal visits as directed by your caregiver. SEEK MEDICAL CARE IF:  You are unsure if you are in labor or if your water has broken.  You have dizziness.  You have mild pelvic cramps, pelvic pressure, or nagging pain in your abdominal area.  You have persistent nausea, vomiting, or diarrhea.  You have a bad smelling vaginal discharge.  You have pain with urination. SEEK IMMEDIATE MEDICAL CARE IF:   You have a fever.  You are leaking fluid from your vagina.  You have spotting or bleeding from your vagina.  You have severe abdominal cramping or pain.  You have rapid weight loss or gain.  You have shortness of breath with chest pain.  You notice sudden or extreme swelling   of your face, hands, ankles, feet, or legs.  You have not felt your baby move in over an hour.  You have severe headaches that do not go away with medicine.  You have vision changes. Document Released: 01/15/2001 Document Revised: 01/26/2013 Document Reviewed: 03/24/2012 ExitCare Patient Information 2015 [employer]. This information is not intended to replace advice given to you by your health care provider. Make sure you discuss any questions you have with your health care provider.  

## 2013-10-04 NOTE — MAU Note (Signed)
WITH INTERPRETER- ALEXANDRA-  SAYS   HAVING PRESSURE - STARTED WED.  - VOIDS Q30 MIN.    DR Gaynell Face-   SAW  ON Thursday-   ALL OK.   DENIES HSV AND MRSA.     NO VE.  LAST SEX-     3 WEEKS AGO.

## 2013-10-05 ENCOUNTER — Other Ambulatory Visit (HOSPITAL_COMMUNITY): Payer: Self-pay | Admitting: Obstetrics

## 2013-10-05 ENCOUNTER — Encounter (HOSPITAL_COMMUNITY): Payer: Self-pay

## 2013-10-05 ENCOUNTER — Ambulatory Visit (HOSPITAL_COMMUNITY)
Admission: RE | Admit: 2013-10-05 | Discharge: 2013-10-05 | Disposition: A | Discharge status: Home / Self Care | Payer: Medicaid Other | Source: Ambulatory Visit | Attending: Obstetrics | Admitting: Obstetrics

## 2013-10-05 VITALS — BP 132/76 | HR 86 | Wt 254.5 lb

## 2013-10-05 DIAGNOSIS — O09299 Supervision of pregnancy with other poor reproductive or obstetric history, unspecified trimester: Secondary | ICD-10-CM

## 2013-10-05 DIAGNOSIS — O99810 Abnormal glucose complicating pregnancy: Secondary | ICD-10-CM | POA: Insufficient documentation

## 2013-10-05 DIAGNOSIS — O24419 Gestational diabetes mellitus in pregnancy, unspecified control: Secondary | ICD-10-CM

## 2013-10-05 DIAGNOSIS — E108 Type 1 diabetes mellitus with unspecified complications: Secondary | ICD-10-CM

## 2013-10-05 DIAGNOSIS — Z1389 Encounter for screening for other disorder: Secondary | ICD-10-CM

## 2013-10-05 LAB — HEMOGLOBIN A1C
Hgb A1c MFr Bld: 6.1 % — ABNORMAL HIGH (ref <5.7)
Mean Plasma Glucose: 128 mg/dL — ABNORMAL HIGH (ref <117)

## 2013-10-05 NOTE — Consult Note (Signed)
Maternal Fetal Medicine Consultation  Requesting Provider(s): Francoise Ceo, MD  Reason for consultation: Nicole Parker is a 33 yo G5P3013 currently at 34w 2d who was seen for consultation due to poorly compliant gestational diabetes.  The patient's Past OB history is remarkable for three term SVDs.  Her last child was macrosomic weighing 9#3oz.  She denies any prior history of gestational diabetes with her previous pregnancies.  Her 3-hr OGTT was 111/205/252/178.  The patient was previously seen for diabetes education, but reports that she has been unable to figure out how to do her fingerstick glucose values and has not checked any sugars since her original diagnosis.  She reports that the fetus is active.  She is otherwise without complaints.   HPI: OB History: OB History   Grav Para Term Preterm Abortions TAB SAB Ect Mult Living   PMH:  Past Medical History  Diagnosis Date  . Gestational diabetes     PSH:  Past Surgical History  Procedure Laterality Date  . No past surgeries     Meds:  Current Outpatient Prescriptions on File Prior to Encounter  Medication Sig Dispense Refill  . acetaminophen (TYLENOL) 325 MG tablet Take 325 mg by mouth every 6 (six) hours as needed for headache.      . Prenatal Vit-Fe Fumarate-FA (PRENATAL MULTIVITAMIN) TABS tablet Take 1 tablet by mouth at bedtime.       No current facility-administered medications on file prior to encounter.   Allergies: No Known Allergies  FH: No family history on file.  Soc:  History   Social History  . Marital Status: Single    Spouse Name: N/A    Number of Children: N/A  . Years of Education: N/A   Occupational History  . Not on file.   Social History Main Topics  . Smoking status: Never Smoker   . Smokeless tobacco: Never Used  . Alcohol Use: No  . Drug Use: No  . Sexual Activity: Yes    Birth Control/ Protection: None   Other Topics Concern  . Not on file    Social History Narrative  . No narrative on file    Review of Systems: no vaginal bleeding or cramping/contractions, no LOF, no nausea/vomiting. All other systems reviewed and are negative.  PE:   Filed Vitals:   10/05/13 1010  BP: 132/76  Pulse: 86    GEN: well-appearing female ABD: gravid, NT  Ultrasound: Single IUP at 34w 2d Poorly compliant A1 GDM The estimated fetal weight today is at the 89th %tile (2911 gm).  The AC measures > 97th %tile. Limited views of the fetal anatomy were obtained due to late gestational age No gross anomalies were noted Normal amniotic fluid volume  A/P: 1) Single IUP at 34w 2d         2) Poorly compliant gestational diabetes - the patient was re-instructed on how to perform fingerstick glucose values and was instructed to check her fingerstick values 4x daily (fasting and 2 hr post prandial values).  She was again counseled regarding the risks to her unborn child to include fetal macrosomia, birth injury, increased risk of cesarean delivery, hypoglycemia / metabolic derangements after delivery, and stillbirth.  We reviewed ultrasound findings - growth pattern consistent with diabetes.  Recommend: 1) Patient is scheduled for follow up with Diabetes Education later this week 2) Will bring in fingerstick values over the next several days -  if sugars are elevated, will have a low threshold to start oral agents.  Given her inability to check fingerstick values, she may not be a very good candidate to start insulin. 3) Will begin 2x weekly NSTS with weekly AFIs 4) HbA1C was drawn.   Thank you for the opportunity to be a part of the care of Nicole Parker. Please contact our office if we can be of further assistance.   I spent approximately 30 minutes with this patient with over 50% of time spent in face-to-face counseling.  Alpha Gula, MD Maternal Fetal Medicine

## 2013-10-05 NOTE — Progress Notes (Signed)
I assisted  The Nurse with discharges papers and instructions. By Orlan Leavens, Spanish Interpreter

## 2013-10-06 LAB — URINE CULTURE

## 2013-10-07 ENCOUNTER — Ambulatory Visit (HOSPITAL_COMMUNITY): Payer: Self-pay

## 2013-10-07 ENCOUNTER — Ambulatory Visit (HOSPITAL_COMMUNITY): Admission: RE | Admit: 2013-10-07 | Payer: Medicaid Other | Source: Ambulatory Visit

## 2013-10-07 ENCOUNTER — Ambulatory Visit (HOSPITAL_COMMUNITY)
Admission: RE | Admit: 2013-10-07 | Discharge: 2013-10-07 | Disposition: A | Discharge status: Home / Self Care | Payer: Medicaid Other | Source: Ambulatory Visit | Attending: Obstetrics | Admitting: Obstetrics

## 2013-10-07 ENCOUNTER — Other Ambulatory Visit: Payer: Self-pay

## 2013-10-07 ENCOUNTER — Encounter: Payer: Medicaid Other | Attending: Obstetrics | Admitting: *Deleted

## 2013-10-07 ENCOUNTER — Encounter (HOSPITAL_COMMUNITY): Payer: Self-pay

## 2013-10-07 VITALS — BP 132/74 | HR 100 | Wt 254.0 lb

## 2013-10-07 DIAGNOSIS — O99810 Abnormal glucose complicating pregnancy: Secondary | ICD-10-CM | POA: Insufficient documentation

## 2013-10-07 DIAGNOSIS — O24419 Gestational diabetes mellitus in pregnancy, unspecified control: Secondary | ICD-10-CM

## 2013-10-07 DIAGNOSIS — E108 Type 1 diabetes mellitus with unspecified complications: Secondary | ICD-10-CM

## 2013-10-07 DIAGNOSIS — Z713 Dietary counseling and surveillance: Secondary | ICD-10-CM | POA: Insufficient documentation

## 2013-10-08 ENCOUNTER — Other Ambulatory Visit (HOSPITAL_COMMUNITY): Payer: Self-pay | Admitting: Maternal and Fetal Medicine

## 2013-10-08 DIAGNOSIS — O24419 Gestational diabetes mellitus in pregnancy, unspecified control: Secondary | ICD-10-CM

## 2013-10-08 NOTE — Progress Notes (Signed)
Patient returns for review of glucose readings, accompanied by interpreter. She continues to be noncompliant with testing. She comes to me with no readings. She brings with her an unopened Reli-On glucometer. I instruction on use of testing system. She provided return demonstration. 2hpp reading of /dl. In consult with Dr. Arville Lime we will continue to monitor and review readings next week. Tyniesha indicates that she is having significant back pain and has difficulty getting around. She states this is why she had not gotten to the pharmacy to obtain the testing system.

## 2013-10-11 ENCOUNTER — Other Ambulatory Visit: Payer: Self-pay

## 2013-10-14 ENCOUNTER — Encounter (HOSPITAL_COMMUNITY): Payer: Self-pay

## 2013-10-14 ENCOUNTER — Ambulatory Visit (HOSPITAL_COMMUNITY)
Admission: RE | Admit: 2013-10-14 | Discharge: 2013-10-14 | Disposition: A | Discharge status: Home / Self Care | Payer: Medicaid Other | Source: Ambulatory Visit | Attending: Obstetrics | Admitting: Obstetrics

## 2013-10-14 ENCOUNTER — Ambulatory Visit (HOSPITAL_COMMUNITY): Payer: Self-pay

## 2013-10-14 ENCOUNTER — Observation Stay (HOSPITAL_COMMUNITY)
Admission: AD | Admit: 2013-10-14 | Discharge: 2013-10-15 | Disposition: A | Discharge status: Home / Self Care | Payer: Medicaid Other | Source: Ambulatory Visit | Attending: Obstetrics | Admitting: Obstetrics

## 2013-10-14 ENCOUNTER — Encounter (HOSPITAL_COMMUNITY): Payer: Self-pay | Admitting: *Deleted

## 2013-10-14 ENCOUNTER — Encounter: Payer: Medicaid Other | Admitting: *Deleted

## 2013-10-14 DIAGNOSIS — O169 Unspecified maternal hypertension, unspecified trimester: Secondary | ICD-10-CM | POA: Insufficient documentation

## 2013-10-14 DIAGNOSIS — O1493 Unspecified pre-eclampsia, third trimester: Secondary | ICD-10-CM

## 2013-10-14 DIAGNOSIS — O24419 Gestational diabetes mellitus in pregnancy, unspecified control: Secondary | ICD-10-CM

## 2013-10-14 DIAGNOSIS — Z8632 Personal history of gestational diabetes: Secondary | ICD-10-CM | POA: Insufficient documentation

## 2013-10-14 DIAGNOSIS — O1490 Unspecified pre-eclampsia, unspecified trimester: Secondary | ICD-10-CM | POA: Diagnosis present

## 2013-10-14 DIAGNOSIS — IMO0002 Reserved for concepts with insufficient information to code with codable children: Secondary | ICD-10-CM | POA: Insufficient documentation

## 2013-10-14 DIAGNOSIS — R51 Headache: Secondary | ICD-10-CM | POA: Insufficient documentation

## 2013-10-14 DIAGNOSIS — O99810 Abnormal glucose complicating pregnancy: Secondary | ICD-10-CM

## 2013-10-14 DIAGNOSIS — O9989 Other specified diseases and conditions complicating pregnancy, childbirth and the puerperium: Principal | ICD-10-CM | POA: Insufficient documentation

## 2013-10-14 HISTORY — DX: Gestational (pregnancy-induced) hypertension without significant proteinuria, unspecified trimester: O13.9

## 2013-10-14 LAB — CBC
HEMATOCRIT: 29.9 % — AB (ref 36.0–46.0)
HEMOGLOBIN: 9.8 g/dL — AB (ref 12.0–15.0)
MCH: 26.2 pg (ref 26.0–34.0)
MCHC: 32.8 g/dL (ref 30.0–36.0)
MCV: 79.9 fL (ref 78.0–100.0)
Platelets: 192 10*3/uL (ref 150–400)
RBC: 3.74 MIL/uL — ABNORMAL LOW (ref 3.87–5.11)
RDW: 14.8 % (ref 11.5–15.5)
WBC: 7.4 10*3/uL (ref 4.0–10.5)

## 2013-10-14 LAB — URINALYSIS, ROUTINE W REFLEX MICROSCOPIC
BILIRUBIN URINE: NEGATIVE
GLUCOSE, UA: NEGATIVE mg/dL
KETONES UR: NEGATIVE mg/dL
Nitrite: NEGATIVE
Protein, ur: 30 mg/dL — AB
Specific Gravity, Urine: 1.015 (ref 1.005–1.030)
Urobilinogen, UA: 0.2 mg/dL (ref 0.0–1.0)
pH: 6.5 (ref 5.0–8.0)

## 2013-10-14 LAB — URINE MICROSCOPIC-ADD ON

## 2013-10-14 LAB — COMPREHENSIVE METABOLIC PANEL
ALT: 17 U/L (ref 0–35)
ANION GAP: 15 (ref 5–15)
AST: 18 U/L (ref 0–37)
Albumin: 2.2 g/dL — ABNORMAL LOW (ref 3.5–5.2)
Alkaline Phosphatase: 100 U/L (ref 39–117)
BILIRUBIN TOTAL: 0.2 mg/dL — AB (ref 0.3–1.2)
BUN: 9 mg/dL (ref 6–23)
CHLORIDE: 104 meq/L (ref 96–112)
CO2: 20 mEq/L (ref 19–32)
Calcium: 8.1 mg/dL — ABNORMAL LOW (ref 8.4–10.5)
Creatinine, Ser: 0.5 mg/dL (ref 0.50–1.10)
GFR calc Af Amer: >90 mL/min (ref >90)
Glucose, Bld: 78 mg/dL (ref 70–99)
Potassium: 3.5 mEq/L — ABNORMAL LOW (ref 3.7–5.3)
Sodium: 139 mEq/L (ref 137–147)
TOTAL PROTEIN: 5.7 g/dL — AB (ref 6.0–8.3)

## 2013-10-14 LAB — WET PREP, GENITAL
Clue Cells Wet Prep HPF POC: NONE SEEN
Trich, Wet Prep: NONE SEEN

## 2013-10-14 LAB — PROTEIN / CREATININE RATIO, URINE
CREATININE, URINE: 110.29 mg/dL
Protein Creatinine Ratio: 0.31 — ABNORMAL HIGH (ref 0.00–0.15)
Total Protein, Urine: 34.4 mg/dL

## 2013-10-14 MED ORDER — ACETAMINOPHEN 325 MG PO TABS: 650.0000 mg | ORAL_TABLET | ORAL | Status: DC | PRN

## 2013-10-14 MED ORDER — DOCUSATE SODIUM 100 MG PO CAPS
100.0000 mg | ORAL_CAPSULE | Freq: Every day | ORAL | Status: DC
  Administered 2013-10-15: 100 mg via ORAL
  Filled 2013-10-14: qty 1

## 2013-10-14 MED ORDER — CALCIUM CARBONATE ANTACID 500 MG PO CHEW
2.0000 | CHEWABLE_TABLET | ORAL | Status: DC | PRN
Start: 2013-10-14 — End: 2013-10-15

## 2013-10-14 MED ORDER — FLUCONAZOLE 150 MG PO TABS: 150.0000 mg | ORAL_TABLET | Freq: Once | ORAL | Status: DC

## 2013-10-14 MED ORDER — PRENATAL MULTIVITAMIN CH
1.0000 | ORAL_TABLET | Freq: Every day | ORAL | Status: DC
  Administered 2013-10-15: 1 via ORAL
  Filled 2013-10-14: qty 1

## 2013-10-14 MED ORDER — ZOLPIDEM TARTRATE 5 MG PO TABS: 5.0000 mg | ORAL_TABLET | Freq: Every evening | ORAL | Status: DC | PRN

## 2013-10-14 NOTE — ED Notes (Signed)
Pt to be evaluated in MAU for elevated BP's.  Report called to [facility] RN.

## 2013-10-14 NOTE — MAU Provider Note (Signed)
History     CSN: [id number]  Arrival date and time: 10/14/13 1624   First Provider Initiated Contact with Patient 10/14/13 1712      Chief Complaint  Patient presents with  . Headache  . Hypertension   Headache  Pertinent negatives include no abdominal pain or fever. Her past medical history is significant for hypertension.  Hypertension Associated symptoms include headaches.    Pt is a 33 yo G5P3013 at [redacted]w[redacted]d weeks IUP sent from MFM for elevated blood pressures.  In [facility]FM for ultrasound due to gestational diabetes.  Pt reports headache, dizziness, and seeing "floaters" that started two days ago.  Denies vision changes or epigastric pain.  +fetal movement.  Denies contractions, vaginal bleeding, or leaking of fluid.    Past Medical History  Diagnosis Date  . Gestational diabetes   . Pregnancy induced hypertension     Past Surgical History  Procedure Laterality Date  . No past surgeries      History reviewed. No pertinent family history.  History  Substance Use Topics  . Smoking status: Never Smoker   . Smokeless tobacco: Never Used  . Alcohol Use: No    Allergies: No Known Allergies  Prescriptions prior to admission  Medication Sig Dispense Refill  . acetaminophen (TYLENOL) 325 MG tablet Take 325 mg by mouth every 6 (six) hours as needed for headache.      . hydrocortisone cream 1 % Apply 1 application topically at bedtime as needed for itching.      . Prenatal Vit-Fe Fumarate-FA (PRENATAL MULTIVITAMIN) TABS tablet Take 1 tablet by mouth at bedtime.      Marland Kitchen glyBURIDE (DIABETA) 2.5 MG tablet Take 2.5 mg by mouth 2 (two) times daily with a meal. Gestational Diabetes 648.83        Review of Systems  Constitutional: Negative for fever and chills.  Eyes:       Floaters  Gastrointestinal: Negative for abdominal pain.  Neurological: Positive for headaches.  All other systems reviewed and are negative.  Physical Exam   Blood pressure 165/101, pulse 85, temperature  98.7 F (37.1 C), resp. rate 18, last menstrual period 12/31/2012.  Physical Exam  Constitutional: She is oriented to person, place, and time. She appears well-developed and well-nourished. No distress.  HENT:  Head: Normocephalic.  Eyes: Pupils are equal, round, and reactive to light.  Neck: Normal range of motion. Neck supple.  Cardiovascular: Normal rate, regular rhythm and normal heart sounds.   Respiratory: Effort normal and breath sounds normal. No respiratory distress.  GI: Soft. There is no tenderness.  Genitourinary: No bleeding around the vagina.  Musculoskeletal: Normal range of motion. She exhibits edema (3+ bilat pedal edema).  Neurological: She is alert and oriented to person, place, and time. She has normal reflexes. She displays normal reflexes.  Skin: Skin is warm and dry.    MAU Course  Procedures  Results for orders placed during the hospital encounter of 10/14/13 (from the past 24 hour(s))  URINALYSIS, ROUTINE W REFLEX MICROSCOPIC     Status: Abnormal   Collection Time    10/14/13  4:50 PM      Result Value Ref Range   Color, Urine YELLOW  YELLOW   APPearance CLOUDY (*) CLEAR   Specific Gravity, Urine 1.015  1.005 - 1.030   pH 6.5  5.0 - 8.0   Glucose, UA NEGATIVE  NEGATIVE mg/dL   Hgb urine dipstick SMALL (*) NEGATIVE   Bilirubin Urine NEGATIVE  NEGATIVE   Ketones,  ur NEGATIVE  NEGATIVE mg/dL   Protein, ur 30 (*) NEGATIVE mg/dL   Urobilinogen, UA 0.2  0.0 - 1.0 mg/dL   Nitrite NEGATIVE  NEGATIVE   Leukocytes, UA LARGE (*) NEGATIVE  PROTEIN / CREATININE RATIO, URINE     Status: Abnormal   Collection Time    10/14/13  4:50 PM      Result Value Ref Range   Creatinine, Urine 110.29     Total Protein, Urine 34.4     PROTEIN CREATININE RATIO 0.31 (*) 0.00 - 0.15  URINE MICROSCOPIC-ADD ON     Status: Abnormal   Collection Time    10/14/13  4:50 PM      Result Value Ref Range   Squamous Epithelial / LPF MANY (*) RARE   WBC, UA TOO NUMEROUS TO COUNT  <3  WBC/hpf   RBC / HPF 21-50  <3 RBC/hpf   Bacteria, UA MANY (*) RARE   Urine-Other RARE YEAST    CBC     Status: Abnormal   Collection Time    10/14/13  5:02 PM      Result Value Ref Range   WBC 7.4  4.0 - 10.5 K/uL   RBC 3.74 (*) 3.87 - 5.11 MIL/uL   Hemoglobin 9.8 (*) 12.0 - 15.0 g/dL   HCT 16.1 (*) 09.6 - 04.5 %   MCV 79.9  78.0 - 100.0 fL   MCH 26.2  26.0 - 34.0 pg   MCHC 32.8  30.0 - 36.0 g/dL   RDW 40.9  81.1 - 91.4 %   Platelets 192  150 - 400 K/uL  COMPREHENSIVE METABOLIC PANEL     Status: Abnormal   Collection Time    10/14/13  5:02 PM      Result Value Ref Range   Sodium 139  137 - 147 mEq/L   Potassium 3.5 (*) 3.7 - 5.3 mEq/L   Chloride 104  96 - 112 mEq/L   CO2 20  19 - 32 mEq/L   Glucose, Bld 78  70 - 99 mg/dL   BUN 9  6 - 23 mg/dL   Creatinine, Ser 7.82  0.50 - 1.10 mg/dL   Calcium 8.1 (*) 8.4 - 10.5 mg/dL   Total Protein 5.7 (*) 6.0 - 8.3 g/dL   Albumin 2.2 (*) 3.5 - 5.2 g/dL   AST 18  0 - 37 U/L   ALT 17  0 - 35 U/L   Alkaline Phosphatase 100  39 - 117 U/L   Total Bilirubin 0.2 (*) 0.3 - 1.2 mg/dL   GFR calc non Af Amer >90  >90 mL/min   GFR calc Af Amer >90  >90 mL/min   Anion gap 15  5 - 15  WET PREP, GENITAL     Status: Abnormal   Collection Time    10/14/13  5:45 PM      Result Value Ref Range   Yeast Wet Prep HPF POC MODERATE (*) NONE SEEN   Trich, Wet Prep NONE SEEN  NONE SEEN   Clue Cells Wet Prep HPF POC NONE SEEN  NONE SEEN   WBC, Wet Prep HPF POC MODERATE (*) NONE SEEN   Ultrasound: Vertex, AFI 11.7 cm   Assessment and Plan  33 yo G5P3013 at [redacted]w[redacted]d wks IUP Preeclampsia, with severe features (headache) Category I FH R Tracing Yeast Infection  Plan: Diflucan ordered in MAU Admit to Antenatal Unit Orders obtained by RN from Dr. Vladimir Faster Kennith Gain, CNM

## 2013-10-14 NOTE — MAU Note (Signed)
Pt sent from MFM for elevated B/P's  And headache. Reports some flashes of light and dizziness as well. Good fetal movement reported

## 2013-10-14 NOTE — Progress Notes (Signed)
Patient present for evaluation of glucose readings. FBS most around /dl, 2hpp have a few in upper /dl. I discussed patients dietary intake to find that she is drinking large amounts of fruit juice. This is correlating to the higher readings. I have reminded her to discontinue all fruit juice. I have encouraged only sugar free beverages. In discussion with Dr. Claudean Severance it has been decided that we will begin Glyburide 2.5mg  BID. This will be called to [employer].

## 2013-10-15 LAB — GLUCOSE, CAPILLARY: GLUCOSE-CAPILLARY: 77 mg/dL (ref 70–99)

## 2013-10-15 MED ORDER — HYDROCORTISONE 1 % EX CREA
1.0000 "application " | TOPICAL_CREAM | Freq: Every evening | CUTANEOUS | Status: DC | PRN
  Filled 2013-10-15: qty 28

## 2013-10-15 MED ORDER — GLYBURIDE 2.5 MG PO TABS
2.5000 mg | ORAL_TABLET | Freq: Two times a day (BID) | ORAL | Status: DC
  Administered 2013-10-15: 2.5 mg via ORAL
  Filled 2013-10-15: qty 1

## 2013-10-15 MED ORDER — PRENATAL MULTIVITAMIN CH: 1.0000 | ORAL_TABLET | Freq: Every day | ORAL | Status: DC

## 2013-10-15 NOTE — Progress Notes (Signed)
Patient ready for discharge, her discharge instructions were given via interpreter, she verbalizes understanding and will return to MAU if she has continuing symptoms.

## 2013-10-15 NOTE — Discharge Summary (Signed)
  Patient is [redacted] weeks pregnant and was seen at [facility] yesterday followup of her diabetes she has been noncompliant but she had a headache for 3 days and borderline blood pressures was admitted for observation blood sugars normal blood pressures been normal and she was discharged this morning on Percocet for headache to see me on Tuesday at 1 PM for followup visit

## 2013-10-15 NOTE — Progress Notes (Signed)
I assisted Dr Gaynell Face with explanation of care plan. Eda H Royal  Interpreter.

## 2013-10-15 NOTE — Progress Notes (Signed)
Patient ID: Nicole Parker, female   DOB: 03-19-1980, 33 y.o.   MRN: [id number] Patient was admitted because of estimated blood pressures PIH labs were negative and she had a headache for 3 days today she feels fine headaches much better she'll be discharged on Percocet and is to see me on Tuesday 1  Pm for follow up visit

## 2013-10-15 NOTE — H&P (Signed)
Nicole Parker is a 33 y.o. female presenting for elevated BP and headache.. Maternal Medical History:  Fetal activity: Perceived fetal activity is normal.   Last perceived fetal movement was within the past hour.    Prenatal complications: no prenatal complications Prenatal Complications - Diabetes: gestational. Diabetes is managed by diet.      OB History   Grav Para Term Preterm Abortions TAB SAB Ect Mult Living   Past Medical History  Diagnosis Date  . Gestational diabetes   . Pregnancy induced hypertension    Past Surgical History  Procedure Laterality Date  . No past surgeries     Family History: family history is not on file. Social History:  reports that she has never smoked. She has never used smokeless tobacco. She reports that she does not drink alcohol or use illicit drugs.   Prenatal Transfer Tool  Maternal Diabetes: Yes:  Diabetes Type:  Diet controlled Genetic Screening: Declined Maternal Ultrasounds/Referrals: Normal Fetal Ultrasounds or other Referrals:  Referred to Materal Fetal Medicine  Maternal Substance Abuse:  No Significant Maternal Medications:  None Significant Maternal Lab Results:  None Other Comments:  None  Review of Systems  Neurological: Positive for headaches.  All other systems reviewed and are negative.     Blood pressure 147/86, pulse 92, temperature 98.6 F (37 C), temperature source Oral, resp. rate 20, last menstrual period 12/31/2012. Exam Physical Exam  Nursing note and vitals reviewed. Constitutional: She is oriented to person, place, and time. She appears well-developed and well-nourished.  HENT:  Head: Normocephalic and atraumatic.  Eyes: Conjunctivae are normal. Pupils are equal, round, and reactive to light.  Neck: Normal range of motion. Neck supple.  Cardiovascular: Normal rate.   Respiratory: Effort normal.  GI: Soft.  Musculoskeletal: Normal range of motion.  Neurological: She is  alert and oriented to person, place, and time.  Skin: Skin is warm and dry.  Psychiatric: She has a normal mood and affect. Her behavior is normal. Judgment and thought content normal.    Prenatal labs: ABO, Rh:   Antibody:   Rubella:   RPR:    HBsAg:    HIV:    GBS:     Assessment/Plan: 35 weeks.  GDM - Diet Controlled.  PIH.  Admitted for observation with elevated BP and HA.   HARPER,CHARLES A 10/15/2013, 12:26 AM

## 2013-10-15 NOTE — Progress Notes (Signed)
I stopped by patient room to check on her needs, I was able to take her breakfast  this morning. I stooped by her room last nigh but I was told she's been resting and sleep. By Orlan Leavens, Spanish interpreter

## 2013-10-15 NOTE — Plan of Care (Signed)
Problem: Phase I Progression Outcomes Goal: Initial discharge plan identified Outcome: Progressing Pt admitted for bedrest and observation of BP. Variance: Communication barriers

## 2013-10-15 NOTE — Discharge Instructions (Signed)
Discharge instructions   You can wash your hair  Shower  Eat what you want  Drink what you want  See me in 6 weeks  Your ankles are going to swell more in the next 2 weeks than when pregnant  No sex for 6 weeks   MARSHALL,BERNARD A, MD 10/15/2013   Hipertensin durante el embarazo (Hypertension During Pregnancy) La hipertensin tambin se denomina presin arterial alta. La presin arterial hace que se mueva la sangre en el cuerpo. A veces, la fuerza que [employer] sangre es demasiado intensa. Cuando est embarazada, esta afeccin se debe controlar atentamente. Puede causar problemas para usted y su beb. CUIDADOS EN EL HOGAR   Cumpla con todos los controles mdicos.  Tome los [employer] como le indic su mdico. Informe a su mdico sobre todos los medicamentos que toma.  Coma muy poca sal.  Haga ejercicios regularmente.  No beba alcohol.  No fume.  No tome bebidas con cafena.  Acustese sobre el lado izquierdo cuando haga reposo.  Su mdico puede recomendarle que tome una aspirina de dosis baja (81 mg) cada da. SOLICITE AYUDA DE INMEDIATO SI:  Siente un dolor intenso en el vientre (abdominal).  Nota una hinchazn repentina [employer], los tobillos o la cara.  Aument 4libras (1,8kg) o ms en 1semana.  Vomita) repetidas veces.  Tiene una hemorragia por la vagina.  No siente los movimientos del beb.  Tiene cefalea.  Tiene visin doble o borrosa.  Tiene calambres o espasmos musculares.  Le falta el aire.  Tiene las yemas de los dedos y los labios [place].  Observa sangre en la orina. ASEGRESE DE QUE:  Comprende estas instrucciones.  Controlar su afeccin.  Recibir ayuda de inmediato si no mejora o si empeora. Document Released: 05/08/2010 Document Revised: 06/07/2013 [facility] Patient Information 2015 [place], [place]. This information is not intended to replace advice given to you by your health care provider. Make sure you discuss any  questions you have with your health care provider.  Induccin del trabajo de parto  (Labor Induction ) Se denomina induccin del trabajo de parto cuando se inician acciones para hacer que una mujer embarazada comience el [place] de [place]. La [employer] de las mujeres comienzan el trabajo de parto sin ayuda entre las semanas 37 y 42 del Psychiatrist. Cuando esto no ocurre o cuando hay una necesidad mdica, pueden utilizarse diferentes mtodos para inducirlo. La induccin del trabajo de parto hace que el tero se contraiga. Tambin hace que el cuello del tero se ablandemadure), se abra (se dilate), y se afine (se borre). Generalmente el trabajo de parto no se induce antes de las 39 semanas excepto que haya un problema con el beb o con la [place].  Antes de inducir el trabajo de parto, el mdico considerar cierto nmero de factores incluyendo los siguientes:  El estado del beb.  Cuntas semanas tiene de [place].  La madurez de los pulmones del beb.  El [place] del cuello del tero.  La posicin del beb. CULES SON LOS MOTIVOS PARA INDUCIR UN PARTO? El [place] de parto puede inducirse por las siguientes razones:  La salud del beb o de la madre estn en riesgo.  El embarazo se ha pasado de trmino en 1 semana o ms.  Ha roto la bolsa de aguas pero no se ha iniciado el trabajo de parto por s mismo.  La madre tiene algn trastorno de salud o una enfermedad grave, como hipertensin arterial, una infeccin, desprendimiento abrupto de la placenta  o diabetes.  Hay escaso lquido amnitico alrededor del beb.  El beb presenta sufrimiento. La conveniencia o el deseo de que el beb nazca en una cierta fecha no es un motivo para inducir el [place]. CULES SON LOS MTODOS UTILIZADOS PARA INDUCIR EL TRABAJO DE PARTO? Algunos mtodos de induccin del [place] son:   Administracin del medicamentos prostaglandina. Este medicamento hace que el cuello uterino se dilate y [place]. Este medicamento  tambin iniciar las contracciones. Puede tomarse por boca o insertarse en la vagina en forma de supositorio.  Insercin en la vagina de un tubo delgado (catter) con un baln en el extremo para dilatar el cuello del tero. Una vez insertado, el baln se infla con agua, lo que provoca la apertura del cuello del tero.  Ruptura de las [place]. El mdico separa el saco amnitico del cuello uterino, haciendo que el cuello uterino se distienda y cause la liberacin de la hormona llamada progesterona. Esto hace que el tero se contraiga. Este procedimiento se realiza durante una visita al consultorio mdico. Le indicarn que vuelva a su casa y espere que se inicien las contracciones. Luego tendr que volver para la induccin.  Ruptura de la bolsa de aguas. El mdico romper el saco amnitico con un pequeo instrumento. Una vez que el saco amnitico se rompe, las Therapist, occupational. Pueden pasar algunas horas hasta que [employer].  Medicamentos que desencadenen o intensifiquen las contracciones. Se lo administrarn a travs de un catter por va intravenosa (IV) que se inserta en una de las venas del brazo. Todos los mtodos de induccin, excepto la ruptura de [place], se Futures trader hospital. La induccin se Games developer hospital, de modo que usted y el beb puedan ser controlados cuidadosamente.  CUNTO TIEMPO LLEVA INDUCIR EL TRABAJO DE PARTO? Algunas inducciones pueden demorar entre 2 y [address]. Generalmente lleva [employer], dependiendo del [place] del cuello del tero. Puede tomar ms tiempo si la induccin se realiza en etapas tempranas del Psychiatrist o es su Tree surgeon. Si han pasado 2 o [address] y no se inicia el trabajo de [place], podrn enviarla a su casa o Magazine features editor cesrea. CULES SON LOS RIESGOS ASOCIADOS CON LA INDUCCiN DEL TRABAJO DE PARTO? Algunos de los riesgos de la induccin son:   Cambios en la frecuencia cardaca fetal, por ejemplo los latidos son demasiado  rpidos, o lentos, o errticos.  Riesgo de distrs fetal.  Posibilidad de infeccin en la madre o el beb.  Aumento de la posibilidad de que sea necesaria una cesrea.  Ruptura (abrupcin) de la placenta del tero (raro).  Ruptura uterina (muy raro). Cuando es Passenger transport manager la induccin por razones mdicas, los beneficios deben superar a los [place]. CULES SON ALGUNAS RAZONES PARA NO INDUCIR EL TRABAJO DE PARTO? La induccin no debe realizarse si:   Se demuestra que el beb no tolera el trabajo de [place].  Fue sometida anteriormente a Personnel officer, como una miomectoma o le han extirpado fibromas.  La placenta est en una posicin muy baja en el tero y obstruye la abertura del cuello (placenta previa).  El beb no est ubicado con la [employer].  El cordn umbilical cae hacia el canal de parto, adelante del beb. Esto puede cortar el suministro de [place] y oxgeno al beb.  Fue sometida a Higher education careers adviser.  Hay circunstancias poco habituales, como que el beb es Doctor, general practice. Document Released: 04/30/2007 Document Revised: 09/23/2012 ExitCare Patient Information  2015 [employer]. This information is not intended to replace advice given to you by your health care provider. Make sure you discuss any questions you have with your health care provider. ° °

## 2013-10-17 ENCOUNTER — Other Ambulatory Visit: Payer: Self-pay

## 2013-10-19 ENCOUNTER — Ambulatory Visit (HOSPITAL_COMMUNITY)
Admission: RE | Admit: 2013-10-19 | Discharge: 2013-10-19 | Disposition: A | Discharge status: Home / Self Care | Payer: Medicaid Other | Source: Ambulatory Visit | Attending: Obstetrics | Admitting: Obstetrics

## 2013-10-19 ENCOUNTER — Encounter (HOSPITAL_COMMUNITY): Payer: Self-pay

## 2013-10-19 DIAGNOSIS — IMO0002 Reserved for concepts with insufficient information to code with codable children: Secondary | ICD-10-CM | POA: Insufficient documentation

## 2013-10-19 DIAGNOSIS — O99810 Abnormal glucose complicating pregnancy: Secondary | ICD-10-CM | POA: Insufficient documentation

## 2013-10-19 LAB — OB RESULTS CONSOLE GBS: GBS: POSITIVE

## 2013-10-19 NOTE — ED Notes (Signed)
See scanned documents for blood sugar logs. 

## 2013-10-22 ENCOUNTER — Ambulatory Visit (HOSPITAL_COMMUNITY): Payer: Medicaid Other | Attending: Obstetrics

## 2013-10-22 ENCOUNTER — Ambulatory Visit (HOSPITAL_COMMUNITY): Payer: Medicaid Other

## 2013-10-22 LAB — OB RESULTS CONSOLE ABO/RH: RH Type: POSITIVE

## 2013-10-22 LAB — OB RESULTS CONSOLE ANTIBODY SCREEN: Antibody Screen: NEGATIVE

## 2013-10-22 LAB — OB RESULTS CONSOLE HIV ANTIBODY (ROUTINE TESTING): HIV: NONREACTIVE

## 2013-10-22 LAB — OB RESULTS CONSOLE RUBELLA ANTIBODY, IGM: Rubella: IMMUNE

## 2013-10-22 LAB — OB RESULTS CONSOLE HEPATITIS B SURFACE ANTIGEN: Hepatitis B Surface Ag: NEGATIVE

## 2013-10-22 LAB — OB RESULTS CONSOLE RPR: RPR: NONREACTIVE

## 2013-10-26 ENCOUNTER — Other Ambulatory Visit (HOSPITAL_COMMUNITY): Payer: Medicaid Other

## 2013-10-26 ENCOUNTER — Ambulatory Visit (HOSPITAL_COMMUNITY): Admission: RE | Admit: 2013-10-26 | Payer: MEDICAID | Source: Ambulatory Visit

## 2013-10-28 ENCOUNTER — Ambulatory Visit (HOSPITAL_COMMUNITY): Payer: Medicaid Other

## 2013-10-28 ENCOUNTER — Ambulatory Visit (HOSPITAL_COMMUNITY): Payer: Self-pay

## 2013-10-29 ENCOUNTER — Encounter (HOSPITAL_COMMUNITY): Payer: Self-pay

## 2013-10-29 ENCOUNTER — Ambulatory Visit (HOSPITAL_COMMUNITY)
Admission: RE | Admit: 2013-10-29 | Discharge: 2013-10-29 | Disposition: A | Discharge status: Home / Self Care | Payer: Medicaid Other | Source: Ambulatory Visit | Attending: Obstetrics | Admitting: Obstetrics

## 2013-10-29 DIAGNOSIS — O99810 Abnormal glucose complicating pregnancy: Secondary | ICD-10-CM | POA: Insufficient documentation

## 2013-10-29 DIAGNOSIS — O24419 Gestational diabetes mellitus in pregnancy, unspecified control: Secondary | ICD-10-CM

## 2013-10-29 NOTE — ED Notes (Signed)
See scanned documents for blood sugar logs. 

## 2013-11-02 ENCOUNTER — Ambulatory Visit (HOSPITAL_COMMUNITY): Admission: RE | Admit: 2013-11-02 | Payer: MEDICAID | Source: Ambulatory Visit

## 2013-11-02 ENCOUNTER — Encounter (HOSPITAL_COMMUNITY): Payer: Medicaid Other | Admitting: Anesthesiology

## 2013-11-02 ENCOUNTER — Inpatient Hospital Stay (HOSPITAL_COMMUNITY): Payer: Medicaid Other | Admitting: Anesthesiology

## 2013-11-02 ENCOUNTER — Encounter (HOSPITAL_COMMUNITY): Payer: Self-pay | Admitting: *Deleted

## 2013-11-02 ENCOUNTER — Inpatient Hospital Stay (HOSPITAL_COMMUNITY)
Admission: AD | Admit: 2013-11-02 | Discharge: 2013-11-05 | DRG: 767 | Disposition: A | Discharge status: Home / Self Care | Payer: Medicaid Other | Source: Ambulatory Visit | Attending: Obstetrics | Admitting: Obstetrics

## 2013-11-02 DIAGNOSIS — O99824 Streptococcus B carrier state complicating childbirth: Secondary | ICD-10-CM | POA: Diagnosis present

## 2013-11-02 DIAGNOSIS — IMO0001 Reserved for inherently not codable concepts without codable children: Secondary | ICD-10-CM

## 2013-11-02 DIAGNOSIS — O1002 Pre-existing essential hypertension complicating childbirth: Secondary | ICD-10-CM | POA: Diagnosis present

## 2013-11-02 DIAGNOSIS — O99214 Obesity complicating childbirth: Secondary | ICD-10-CM | POA: Diagnosis present

## 2013-11-02 DIAGNOSIS — O24429 Gestational diabetes mellitus in childbirth, unspecified control: Secondary | ICD-10-CM | POA: Diagnosis present

## 2013-11-02 DIAGNOSIS — Z3A38 38 weeks gestation of pregnancy: Secondary | ICD-10-CM | POA: Diagnosis present

## 2013-11-02 DIAGNOSIS — Z6841 Body Mass Index (BMI) 40.0 and over, adult: Secondary | ICD-10-CM | POA: Diagnosis not present

## 2013-11-02 HISTORY — DX: Essential (primary) hypertension: I10

## 2013-11-02 LAB — GLUCOSE, RANDOM: GLUCOSE: 67 mg/dL — AB (ref 70–99)

## 2013-11-02 LAB — CBC
HCT: 32.8 % — ABNORMAL LOW (ref 36.0–46.0)
Hemoglobin: 10.6 g/dL — ABNORMAL LOW (ref 12.0–15.0)
MCH: 25.5 pg — AB (ref 26.0–34.0)
MCHC: 32.3 g/dL (ref 30.0–36.0)
MCV: 78.8 fL (ref 78.0–100.0)
PLATELETS: 195 10*3/uL (ref 150–400)
RBC: 4.16 MIL/uL (ref 3.87–5.11)
RDW: 15.6 % — AB (ref 11.5–15.5)
WBC: 7 10*3/uL (ref 4.0–10.5)

## 2013-11-02 LAB — TYPE AND SCREEN
ABO/RH(D): A POS
ANTIBODY SCREEN: NEGATIVE

## 2013-11-02 LAB — ABO/RH: ABO/RH(D): A POS

## 2013-11-02 MED ORDER — OXYCODONE-ACETAMINOPHEN 5-325 MG PO TABS: 1.0000 | ORAL_TABLET | ORAL | Status: DC | PRN

## 2013-11-02 MED ORDER — EPHEDRINE 5 MG/ML INJ
10.0000 mg | INTRAVENOUS | Status: DC | PRN
  Filled 2013-11-02: qty 2
  Filled 2013-11-02: qty 4

## 2013-11-02 MED ORDER — PHENYLEPHRINE 40 MCG/ML (10ML) SYRINGE FOR IV PUSH (FOR BLOOD PRESSURE SUPPORT)
80.0000 ug | PREFILLED_SYRINGE | INTRAVENOUS | Status: DC | PRN
Start: 2013-11-02 — End: 2013-11-03
  Filled 2013-11-02: qty 2

## 2013-11-02 MED ORDER — OXYTOCIN BOLUS FROM INFUSION: 500.0000 mL | INTRAVENOUS | Status: DC

## 2013-11-02 MED ORDER — EPHEDRINE 5 MG/ML INJ
10.0000 mg | INTRAVENOUS | Status: DC | PRN
  Filled 2013-11-02: qty 2

## 2013-11-02 MED ORDER — DIPHENHYDRAMINE HCL 50 MG/ML IJ SOLN: 12.5000 mg | INTRAMUSCULAR | Status: DC | PRN

## 2013-11-02 MED ORDER — FLEET ENEMA 7-19 GM/118ML RE ENEM
1.0000 | ENEMA | RECTAL | Status: DC | PRN
Start: 2013-11-02 — End: 2013-11-03

## 2013-11-02 MED ORDER — FENTANYL 2.5 MCG/ML BUPIVACAINE 1/10 % EPIDURAL INFUSION (WH - ANES)
14.0000 mL/h | INTRAMUSCULAR | Status: DC | PRN
  Filled 2013-11-02: qty 125

## 2013-11-02 MED ORDER — ONDANSETRON HCL 4 MG/2ML IJ SOLN
4.0000 mg | Freq: Four times a day (QID) | INTRAMUSCULAR | Status: DC | PRN
Start: 2013-11-02 — End: 2013-11-03

## 2013-11-02 MED ORDER — TERBUTALINE SULFATE 1 MG/ML IJ SOLN
0.2500 mg | Freq: Once | INTRAMUSCULAR | Status: AC | PRN
Start: 2013-11-02 — End: 2013-11-02

## 2013-11-02 MED ORDER — ZOLPIDEM TARTRATE 5 MG PO TABS: 5.0000 mg | ORAL_TABLET | Freq: Every evening | ORAL | Status: DC | PRN

## 2013-11-02 MED ORDER — LACTATED RINGERS IV SOLN: 500.0000 mL | Freq: Once | INTRAVENOUS | Status: DC

## 2013-11-02 MED ORDER — MAGNESIUM SULFATE BOLUS VIA INFUSION
4.0000 g | Freq: Once | INTRAVENOUS | Status: AC
  Administered 2013-11-02: 4 g via INTRAVENOUS
  Filled 2013-11-02: qty 500

## 2013-11-02 MED ORDER — MAGNESIUM SULFATE 40 G IN LACTATED RINGERS - SIMPLE
2.0000 g/h | INTRAVENOUS | Status: DC
  Administered 2013-11-02 – 2013-11-03 (×2): 2 g/h via INTRAVENOUS
  Filled 2013-11-02 (×2): qty 500

## 2013-11-02 MED ORDER — CITRIC ACID-SODIUM CITRATE 334-500 MG/5ML PO SOLN: 30.0000 mL | ORAL | Status: DC | PRN

## 2013-11-02 MED ORDER — LACTATED RINGERS IV SOLN
INTRAVENOUS | Status: DC
  Administered 2013-11-02: 15:00:00 via INTRAVENOUS

## 2013-11-02 MED ORDER — LIDOCAINE HCL (PF) 1 % IJ SOLN
30.0000 mL | INTRAMUSCULAR | Status: DC | PRN
  Filled 2013-11-02: qty 30

## 2013-11-02 MED ORDER — PENICILLIN G POTASSIUM 5000000 UNITS IJ SOLR
5.0000 10*6.[IU] | Freq: Once | INTRAVENOUS | Status: AC
  Administered 2013-11-02: 5 10*6.[IU] via INTRAVENOUS
  Filled 2013-11-02: qty 5

## 2013-11-02 MED ORDER — PENICILLIN G POTASSIUM 5000000 UNITS IJ SOLR
2.5000 10*6.[IU] | INTRAVENOUS | Status: DC
  Administered 2013-11-02 (×2): 2.5 10*6.[IU] via INTRAVENOUS
  Filled 2013-11-02 (×3): qty 2.5

## 2013-11-02 MED ORDER — ACETAMINOPHEN 325 MG PO TABS
650.0000 mg | ORAL_TABLET | ORAL | Status: DC | PRN
  Administered 2013-11-02: 650 mg via ORAL
  Filled 2013-11-02: qty 2

## 2013-11-02 MED ORDER — FENTANYL 2.5 MCG/ML BUPIVACAINE 1/10 % EPIDURAL INFUSION (WH - ANES)
INTRAMUSCULAR | Status: DC | PRN
  Administered 2013-11-02: 14 mL/h via EPIDURAL

## 2013-11-02 MED ORDER — OXYCODONE-ACETAMINOPHEN 5-325 MG PO TABS: 2.0000 | ORAL_TABLET | ORAL | Status: DC | PRN

## 2013-11-02 MED ORDER — OXYTOCIN 40 UNITS IN LACTATED RINGERS INFUSION - SIMPLE MED
62.5000 mL/h | INTRAVENOUS | Status: DC
  Filled 2013-11-02: qty 1000

## 2013-11-02 MED ORDER — OXYTOCIN 40 UNITS IN LACTATED RINGERS INFUSION - SIMPLE MED
1.0000 m[IU]/min | INTRAVENOUS | Status: DC
Start: 2013-11-02 — End: 2013-11-03
  Administered 2013-11-02: 2 m[IU]/min via INTRAVENOUS

## 2013-11-02 MED ORDER — BUTORPHANOL TARTRATE 1 MG/ML IJ SOLN: 1.0000 mg | INTRAMUSCULAR | Status: DC | PRN

## 2013-11-02 MED ORDER — TERBUTALINE SULFATE 1 MG/ML IJ SOLN: 0.2500 mg | Freq: Once | INTRAMUSCULAR | Status: AC | PRN

## 2013-11-02 MED ORDER — LACTATED RINGERS IV SOLN: 500.0000 mL | INTRAVENOUS | Status: DC | PRN

## 2013-11-02 MED ORDER — PHENYLEPHRINE 40 MCG/ML (10ML) SYRINGE FOR IV PUSH (FOR BLOOD PRESSURE SUPPORT)
80.0000 ug | PREFILLED_SYRINGE | INTRAVENOUS | Status: DC | PRN
  Filled 2013-11-02: qty 2
  Filled 2013-11-02: qty 10

## 2013-11-02 MED ORDER — LABETALOL HCL 200 MG PO TABS
200.0000 mg | ORAL_TABLET | Freq: Two times a day (BID) | ORAL | Status: DC
  Administered 2013-11-02 – 2013-11-05 (×6): 200 mg via ORAL
  Filled 2013-11-02 (×6): qty 1

## 2013-11-02 MED ORDER — LIDOCAINE HCL (PF) 1 % IJ SOLN
INTRAMUSCULAR | Status: DC | PRN
  Administered 2013-11-02 (×2): 8 mL

## 2013-11-02 NOTE — Anesthesia Preprocedure Evaluation (Signed)
Anesthesia Evaluation  Patient identified by MRN, date of birth, ID band Patient awake    Reviewed: Allergy & Precautions, H&P , NPO status , Patient's Chart, lab work & pertinent test results  Airway Mallampati: III TM Distance: >3 FB Neck ROM: full    Dental no notable dental hx.    Pulmonary neg pulmonary ROS,    Pulmonary exam normal       Cardiovascular hypertension, Pt. on medications     Neuro/Psych negative neurological ROS  negative psych ROS   GI/Hepatic negative GI ROS, Neg liver ROS,   Endo/Other  diabetes, GestationalMorbid obesity  Renal/GU negative Renal ROS     Musculoskeletal   Abdominal (+) + obese,   Peds  Hematology negative hematology ROS (+)   Anesthesia Other Findings   Reproductive/Obstetrics (+) Pregnancy                           Anesthesia Physical Anesthesia Plan  ASA: III  Anesthesia Plan: Epidural   Post-op Pain Management:    Induction:   Airway Management Planned:   Additional Equipment:   Intra-op Plan:   Post-operative Plan:   Informed Consent: I have reviewed the patients History and Physical, chart, labs and discussed the procedure including the risks, benefits and alternatives for the proposed anesthesia with the patient or authorized representative who has indicated his/her understanding and acceptance.     Plan Discussed with:   Anesthesia Plan Comments:         Anesthesia Quick Evaluation

## 2013-11-02 NOTE — Anesthesia Procedure Notes (Signed)
Epidural Patient location during procedure: OB Start time: 11/02/2013 7:47 PM End time: 11/02/2013 7:51 PM  Staffing Anesthesiologist: Leilani AbleHATCHETT, Deveion Denz Performed by: anesthesiologist   Preanesthetic Checklist Completed: patient identified, surgical consent, pre-op evaluation, timeout performed, IV checked, risks and benefits discussed and monitors and equipment checked  Epidural Patient position: sitting Prep: site prepped and draped and DuraPrep Patient monitoring: continuous pulse ox and blood pressure Approach: midline Location: L3-L4 Injection technique: LOR air  Needle:  Needle type: Tuohy  Needle gauge: 17 G Needle length: 9 cm and 9 Needle insertion depth: 7 cm Catheter type: closed end flexible Catheter size: 19 Gauge Catheter at skin depth: 13 cm Test dose: negative and Other  Assessment Events: blood not aspirated, injection not painful, no injection resistance, negative IV test and no paresthesia  Additional Notes Reason for block:procedure for pain

## 2013-11-02 NOTE — H&P (Signed)
This is Dr. Francoise CeoBernard Kasiya Burck dictating the history and physical on  Nicole Parker she's a 33 year old gravida 5 para 3013 at 3538 weeks and 2 days followed by MFM for diabetes with a nonstress test is in glyburide twice daily 2.5 and the patient has been noncompliant her GBS is positive and she is receiving penicillin today in the op cerebellar pressures 160 of 100 showed 3+ proteinuria and she was admitted for induction cervix is 3 cm 70% posterior amniotomy performed an IUPC inserted she's on low-dose Pitocin Past medical history gestational diabetes in her her pregnancies Past surgical history negative Social history negative System review noncontributory Physical exam obese female in no distress HEENT negative Lungs P&A Heart regular rhythm no murmurs or gallops Breasts negative Abdomen term Pelvic as described above Extremities negative and

## 2013-11-03 ENCOUNTER — Encounter (HOSPITAL_COMMUNITY): Payer: Self-pay

## 2013-11-03 LAB — CBC
HEMATOCRIT: 29.8 % — AB (ref 36.0–46.0)
HEMATOCRIT: 36.7 % (ref 36.0–46.0)
HEMOGLOBIN: 11.7 g/dL — AB (ref 12.0–15.0)
HEMOGLOBIN: 9.6 g/dL — AB (ref 12.0–15.0)
MCH: 25.1 pg — AB (ref 26.0–34.0)
MCH: 25.1 pg — ABNORMAL LOW (ref 26.0–34.0)
MCHC: 31.9 g/dL (ref 30.0–36.0)
MCHC: 32.2 g/dL (ref 30.0–36.0)
MCV: 77.8 fL — AB (ref 78.0–100.0)
MCV: 78.8 fL (ref 78.0–100.0)
Platelets: 195 10*3/uL (ref 150–400)
Platelets: 213 10*3/uL (ref 150–400)
RBC: 3.83 MIL/uL — AB (ref 3.87–5.11)
RBC: 4.66 MIL/uL (ref 3.87–5.11)
RDW: 15.5 % (ref 11.5–15.5)
RDW: 15.5 % (ref 11.5–15.5)
WBC: 13.2 10*3/uL — AB (ref 4.0–10.5)
WBC: 14.7 10*3/uL — ABNORMAL HIGH (ref 4.0–10.5)

## 2013-11-03 LAB — MRSA PCR SCREENING: MRSA BY PCR: NEGATIVE

## 2013-11-03 LAB — RPR

## 2013-11-03 MED ORDER — BENZOCAINE-MENTHOL 20-0.5 % EX AERO: 1.0000 "application " | INHALATION_SPRAY | CUTANEOUS | Status: DC | PRN

## 2013-11-03 MED ORDER — DIBUCAINE 1 % RE OINT: 1.0000 "application " | TOPICAL_OINTMENT | RECTAL | Status: DC | PRN

## 2013-11-03 MED ORDER — SENNOSIDES-DOCUSATE SODIUM 8.6-50 MG PO TABS
2.0000 | ORAL_TABLET | ORAL | Status: DC
  Administered 2013-11-03: 2 via ORAL
  Filled 2013-11-03: qty 2

## 2013-11-03 MED ORDER — SIMETHICONE 80 MG PO CHEW: 80.0000 mg | CHEWABLE_TABLET | ORAL | Status: DC | PRN

## 2013-11-03 MED ORDER — SENNOSIDES-DOCUSATE SODIUM 8.6-50 MG PO TABS
2.0000 | ORAL_TABLET | ORAL | Status: DC
  Administered 2013-11-03 – 2013-11-04 (×2): 2 via ORAL
  Filled 2013-11-03 (×2): qty 2

## 2013-11-03 MED ORDER — DIPHENHYDRAMINE HCL 25 MG PO CAPS: 25.0000 mg | ORAL_CAPSULE | Freq: Four times a day (QID) | ORAL | Status: DC | PRN

## 2013-11-03 MED ORDER — FERROUS SULFATE 325 (65 FE) MG PO TABS
325.0000 mg | ORAL_TABLET | Freq: Two times a day (BID) | ORAL | Status: DC
  Administered 2013-11-03 – 2013-11-05 (×5): 325 mg via ORAL
  Filled 2013-11-03 (×5): qty 1

## 2013-11-03 MED ORDER — FUROSEMIDE 10 MG/ML IJ SOLN
40.0000 mg | Freq: Once | INTRAMUSCULAR | Status: AC
  Administered 2013-11-03: 40 mg via INTRAVENOUS
  Filled 2013-11-03: qty 4

## 2013-11-03 MED ORDER — LACTATED RINGERS IV SOLN
INTRAVENOUS | Status: DC
  Administered 2013-11-03 (×3): via INTRAVENOUS

## 2013-11-03 MED ORDER — TETANUS-DIPHTH-ACELL PERTUSSIS 5-2.5-18.5 LF-MCG/0.5 IM SUSP
0.5000 mL | Freq: Once | INTRAMUSCULAR | Status: AC
  Administered 2013-11-03: 0.5 mL via INTRAMUSCULAR
  Filled 2013-11-03: qty 0.5

## 2013-11-03 MED ORDER — PRENATAL MULTIVITAMIN CH
1.0000 | ORAL_TABLET | Freq: Every day | ORAL | Status: DC
  Administered 2013-11-03 – 2013-11-04 (×2): 1 via ORAL
  Filled 2013-11-03 (×2): qty 1

## 2013-11-03 MED ORDER — WITCH HAZEL-GLYCERIN EX PADS: 1.0000 "application " | MEDICATED_PAD | CUTANEOUS | Status: DC | PRN

## 2013-11-03 MED ORDER — ONDANSETRON HCL 4 MG PO TABS: 4.0000 mg | ORAL_TABLET | ORAL | Status: DC | PRN

## 2013-11-03 MED ORDER — OXYCODONE-ACETAMINOPHEN 5-325 MG PO TABS: 2.0000 | ORAL_TABLET | ORAL | Status: DC | PRN

## 2013-11-03 MED ORDER — IBUPROFEN 600 MG PO TABS
600.0000 mg | ORAL_TABLET | Freq: Four times a day (QID) | ORAL | Status: DC
  Administered 2013-11-03 – 2013-11-05 (×9): 600 mg via ORAL
  Filled 2013-11-03 (×9): qty 1

## 2013-11-03 MED ORDER — INFLUENZA VAC SPLIT QUAD 0.5 ML IM SUSY
0.5000 mL | PREFILLED_SYRINGE | INTRAMUSCULAR | Status: DC
  Filled 2013-11-03: qty 0.5

## 2013-11-03 MED ORDER — ZOLPIDEM TARTRATE 5 MG PO TABS: 5.0000 mg | ORAL_TABLET | Freq: Every evening | ORAL | Status: DC | PRN

## 2013-11-03 MED ORDER — LACTATED RINGERS IV BOLUS (SEPSIS): 500.0000 mL | Freq: Once | INTRAVENOUS | Status: DC

## 2013-11-03 MED ORDER — LANOLIN HYDROUS EX OINT: TOPICAL_OINTMENT | CUTANEOUS | Status: DC | PRN

## 2013-11-03 MED ORDER — ONDANSETRON HCL 4 MG/2ML IJ SOLN: 4.0000 mg | INTRAMUSCULAR | Status: DC | PRN

## 2013-11-03 MED ORDER — MISOPROSTOL 200 MCG PO TABS
ORAL_TABLET | ORAL | Status: AC
  Administered 2013-11-03: 800 ug via RECTAL
  Filled 2013-11-03: qty 4

## 2013-11-03 MED ORDER — MISOPROSTOL 200 MCG PO TABS
800.0000 ug | ORAL_TABLET | Freq: Once | ORAL | Status: AC
  Administered 2013-11-03: 800 ug via RECTAL

## 2013-11-03 MED ORDER — OXYCODONE-ACETAMINOPHEN 5-325 MG PO TABS
1.0000 | ORAL_TABLET | ORAL | Status: DC | PRN
  Administered 2013-11-03: 1 via ORAL
  Filled 2013-11-03: qty 1

## 2013-11-03 NOTE — Progress Notes (Signed)
UR chart review completed.  

## 2013-11-03 NOTE — Progress Notes (Signed)
Dr Gaynell FaceMarshall at bedside to evaluate patient's bleeding in person. Dr Gaynell FaceMarshall performed a sterile manual uterine exploration. Five small (cherry sized) clots and two moderate (lemon sized) clots were removed from the lower uterine segment. Dr Gaynell FaceMarshall stated he was ok with the patient's bleeding and fundal tone/height. Will continue to assess.

## 2013-11-03 NOTE — Progress Notes (Signed)
[facility] easily to bed with minimal assistance.  Magnesium Sulfate inf at 2gms/5725ml/hr.  Foley catheter to BSD.  SR up x 2.  Husband present and holding baby.  Spanish interpreter at bedside, pt teaching completed with her assistance.  Given Baby and Me Booklet, discussed patient rights while in hospital, discussed advanced directives - declined further information.  Pt plans to breast and bottle feed - requests baby to go to CN for now.  Breastfeeding basics discussed and need for frequent breast feeding attempts to establish milk supply.  Discussed options for pain control - will medicate.  Plan of care discussed and pt and husband both verbalize understanding and agreement.

## 2013-11-03 NOTE — Lactation Note (Signed)
This note was copied from the chart of Nicole Parker. Lactation Consultation Note  Initial visit made.  FOB here to assist with interpreting.  Baby just finished a 15 minute feeding at breast.  Parents have been supplementing feeds with small amounts of formula.  Volume parameters reviewed and copy at bedside.  Discussed supply and demand and importance of always putting baby to breast first.  Instructed parents to call for assist if any difficulty with latch or feeding.  Patient Name: Nicole Parker ZOXWR'UToday's Date: 11/03/2013 Reason for consult: Initial assessment   Maternal Data Formula Feeding for Exclusion: Yes Reason for exclusion: Mother's choice to formula and breast feed on admission  Feeding Feeding Type: Breast Fed Nipple Type: Slow - flow Length of feed: 15 min  LATCH Score/Interventions                      Lactation Tools Discussed/Used     Consult Status Consult Status: Follow-up Date: 11/04/13 Follow-up type: In-patient    Huston FoleyMOULDEN, Arta Stump S 11/03/2013, 11:37 AM

## 2013-11-03 NOTE — Progress Notes (Signed)
I was present during the labor and delivery, I assisted Ricard DillonElizabeth RN and Dr Gaynell FaceMarshall with some instructions during the delivery process. BY Orlan LeavensViria Alvarez

## 2013-11-03 NOTE — Progress Notes (Signed)
Patient ID: Nicole Parker, female   DOB: 04/28/1980, 33 y.o.   MRN: [id number] Postpartum day one Blood pressure 152/96 she is on labetalol 200 mg by mouth twice a day and also on her magnesium sulfate Fundus firm Lochia is moderate Legs negative Continue magnesium sulfate doing well

## 2013-11-03 NOTE — Progress Notes (Signed)
I assisted Lajuana RippleBrenda RN with a plan of care information. By Orlan LeavensViria Alvarez

## 2013-11-04 LAB — GLUCOSE, CAPILLARY: Glucose-Capillary: 114 mg/dL — ABNORMAL HIGH (ref 70–99)

## 2013-11-04 NOTE — Progress Notes (Signed)
Patient ID: Nicole Parker, female   DOB: 05/01/1980, 33 y.o.   MRN: [id number] Postpartum day 2 Vital signs normal Magnesium sulfate discontinued this a.m. and vital signs remained all transfer patient from the unit

## 2013-11-04 NOTE — Progress Notes (Signed)
I stopped by patient room to check on her needs , I spoke with Steward DroneBrenda RN and I was told patients its ok she doesn't need nothing at the time. By Orlan LeavensViria Alvarez

## 2013-11-04 NOTE — Anesthesia Postprocedure Evaluation (Signed)
  Anesthesia Post-op Note  Patient: Nicole Parker  Procedure(s) Performed: * No procedures listed *  Patient Location: PACU and Women's Unit  Anesthesia Type:Epidural  Level of Consciousness: awake, alert  and oriented  Airway and Oxygen Therapy: Patient Spontanous Breathing  Post-op Pain: mild  Post-op Assessment: Patient's Cardiovascular Status Stable, Respiratory Function Stable, No signs of Nausea or vomiting, Adequate PO intake, Pain level controlled, No headache, No backache, No residual numbness and No residual motor weakness  Post-op Vital Signs: Reviewed and stable  Last Vitals:  Filed Vitals:   11/04/13 1053  BP: 136/87  Pulse: 88  Temp: 36.7 C  Resp: 20    Complications: No apparent anesthesia complications

## 2013-11-05 ENCOUNTER — Ambulatory Visit (HOSPITAL_COMMUNITY): Payer: MEDICAID

## 2013-11-05 NOTE — Progress Notes (Addendum)
I stopped  by patients room  And check on her needs also I request by the patient more baby supplies. Orlan LeavensViria Alvarez, Spanish Interpreter

## 2013-11-05 NOTE — Discharge Summary (Signed)
Obstetric Discharge Summary Reason for Admission: induction of labor Prenatal Procedures: Preeclampsia Intrapartum Procedures: spontaneous vaginal delivery Postpartum Procedures: none Complications-Operative and Postpartum: none Hemoglobin  Date Value Ref Range Status  11/03/2013 9.6* 12.0 - 15.0 g/dL Final     REPEATED TO VERIFY     DELTA CHECK NOTED     HCT  Date Value Ref Range Status  11/03/2013 29.8* 36.0 - 46.0 % Final    Physical Exam:  General: alert Lochia: appropriate Uterine Fundus: firm Incision: healing well DVT Evaluation: No evidence of DVT seen on physical exam.  Discharge Diagnoses: Term Pregnancy-delivered  Discharge Information: Date: 11/05/2013 Activity: pelvic rest Diet: routine Medications: Percocet Condition: improved Instructions: refer to practice specific booklet Discharge to: home Follow-up Information   Follow up with Kathreen CosierMARSHALL,Ereka Brau A, MD.   Specialty:  Obstetrics and Gynecology   Contact information:   [address] [place] [id number] [phone]       Newborn Data: Live born female  Birth Weight: 8 lb 12 oz (3970 g) APGAR: 9, 9  Home with mother.  Lauriann Milillo A 11/05/2013, 6:52 AM

## 2013-11-05 NOTE — Discharge Instructions (Signed)
Discharge instructions   You can wash your hair  Shower  Eat what you want  Drink what you want  See me in 6 weeks  Your ankles are going to swell more in the next 2 weeks than when pregnant  No sex for 6 weeks   Nicole Pierron A, MD 11/05/2013

## 2013-11-05 NOTE — Plan of Care (Signed)
Problem: Discharge Progression Outcomes Goal: Other Discharge Outcomes/Goals Outcome: Completed/Met Date Met:  11/05/13 BP borderline hypertensive.  Reviewed labetelol use @ home.  Denies headache.  Intake and output within normal limits.   Instructed patient to increased fluid intake and follow up with Dr. Ruthann Cancer on  Tuesday for BP check

## 2013-11-05 NOTE — Progress Notes (Deleted)
I stopped by patients rooms and assisted Nicole Parker with baby screening, By Orlan LeavensViria Alvarez Spanish Interpreter

## 2013-12-06 ENCOUNTER — Encounter (HOSPITAL_COMMUNITY): Payer: Self-pay

## 2015-05-28 IMAGING — US US OB LIMITED
1 series · 13 of 16 positions shown · non-contrast
Comparison: none

[Series 1: us ob limited · 0.37mm/px · 16 acquisitions, 13 frames shown]
[im 1/16]
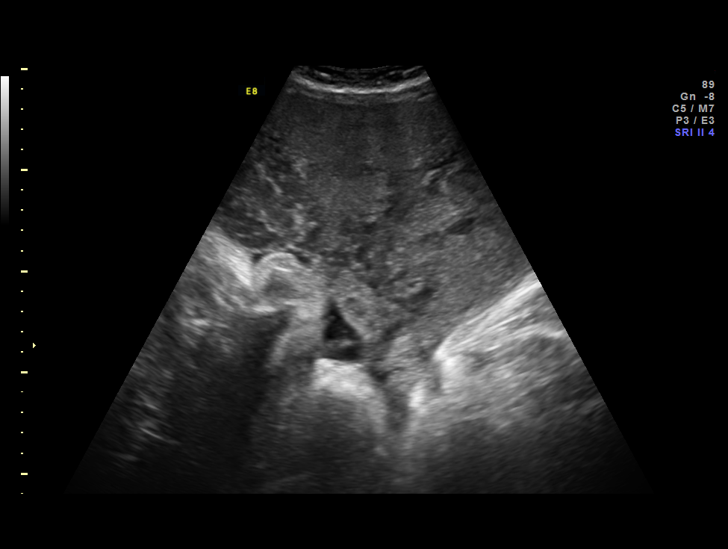
[im 2/16]
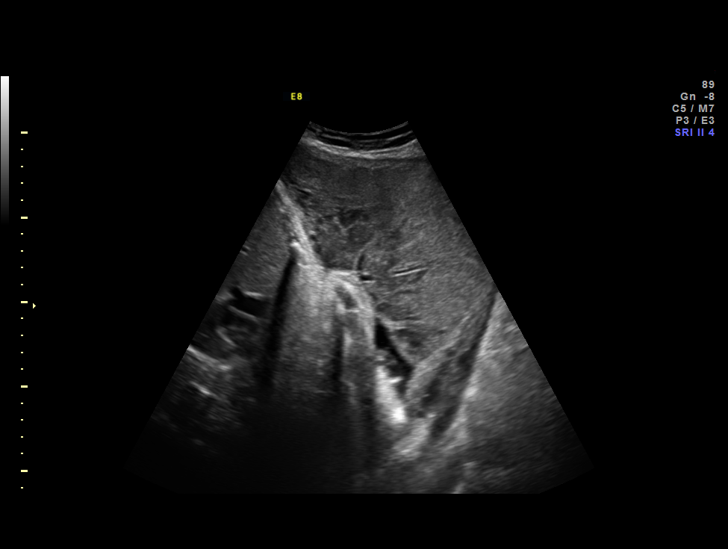
[im 4/16]
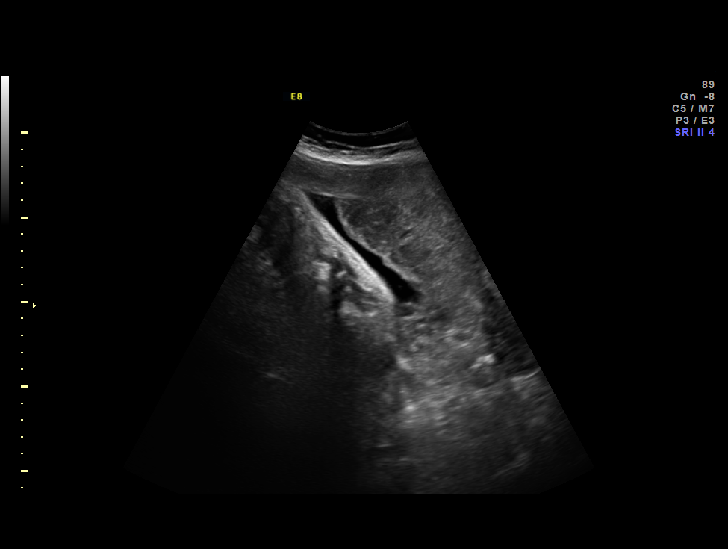
[im 5/16]
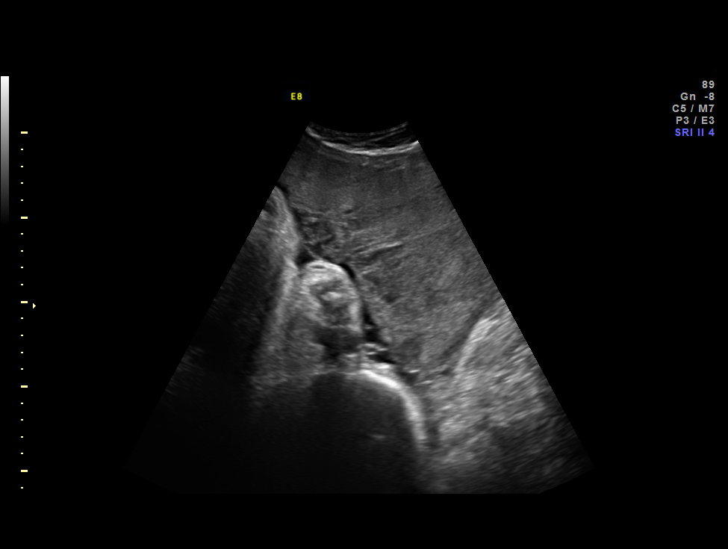
[im 6/16]
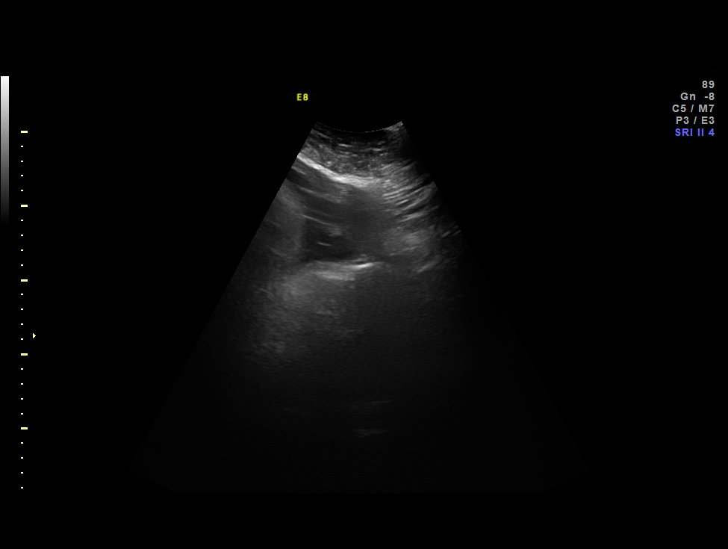
[im 7/16]
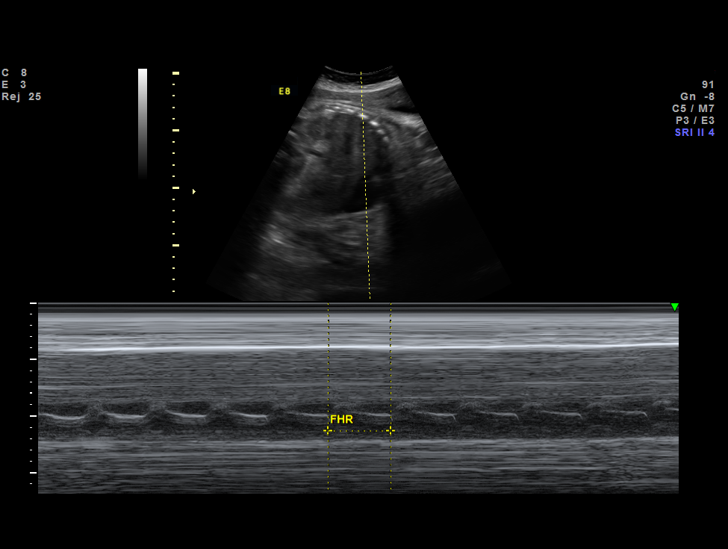
[im 9/16]
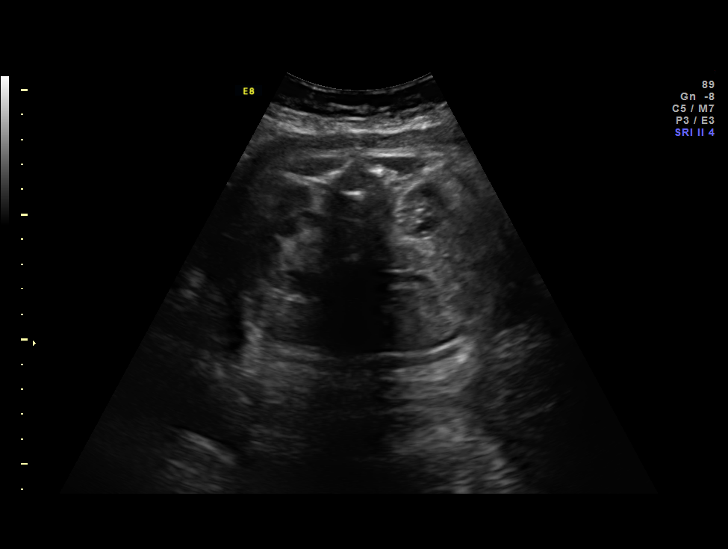
[im 10/16]
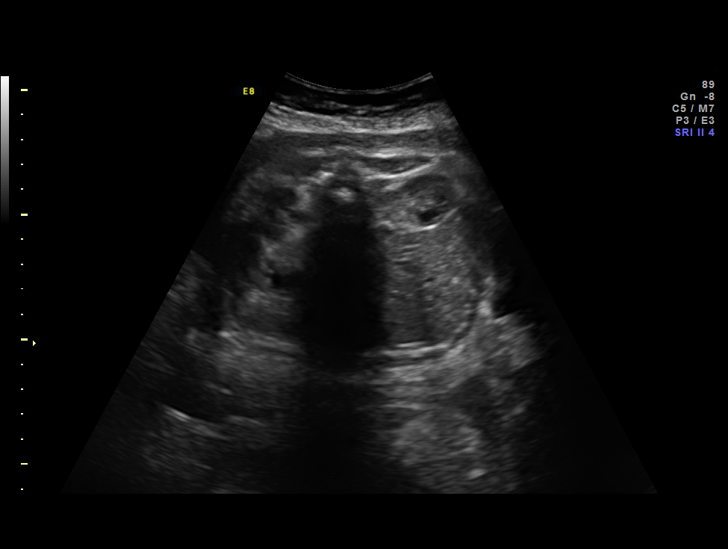
[im 11/16]
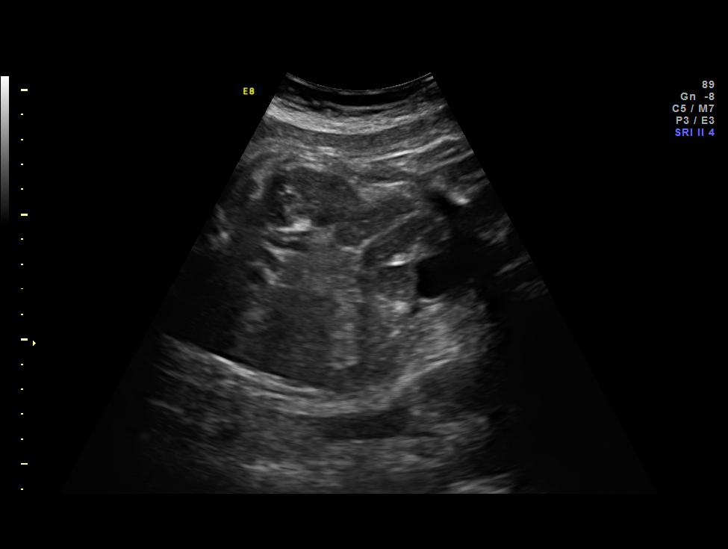
[im 12/16]
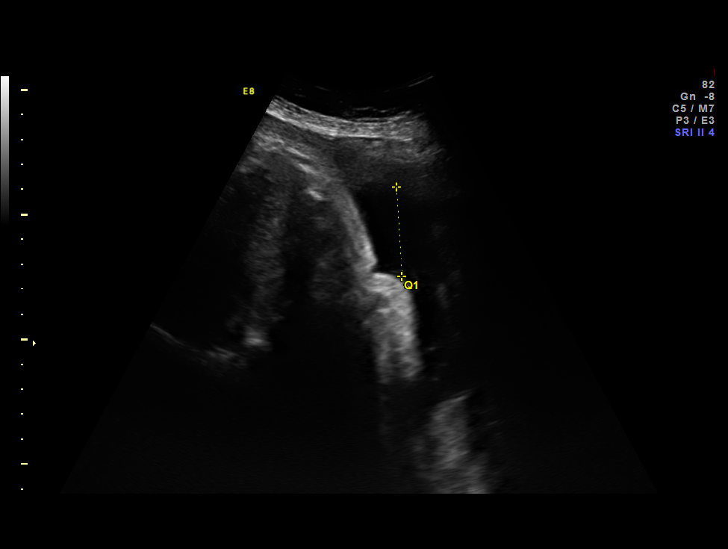
[im 13/16]
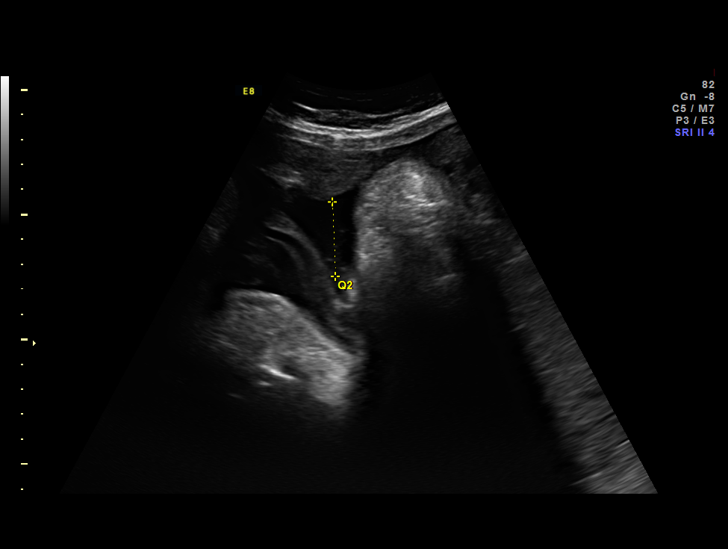
[im 15/16]
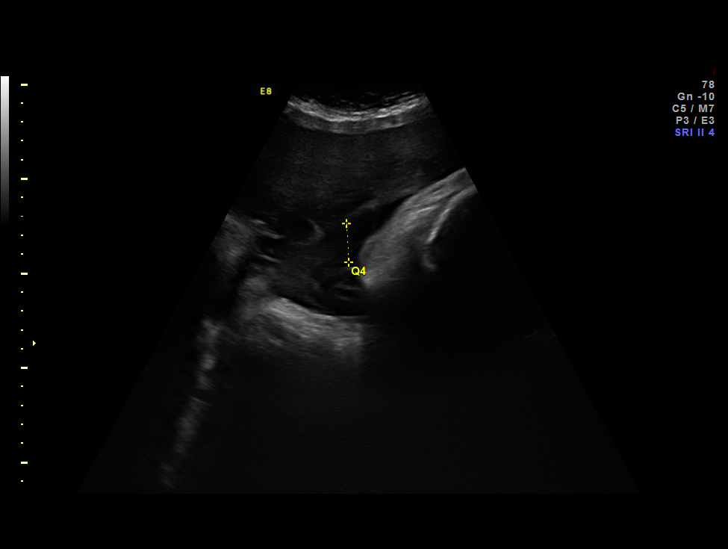
[im 16/16]
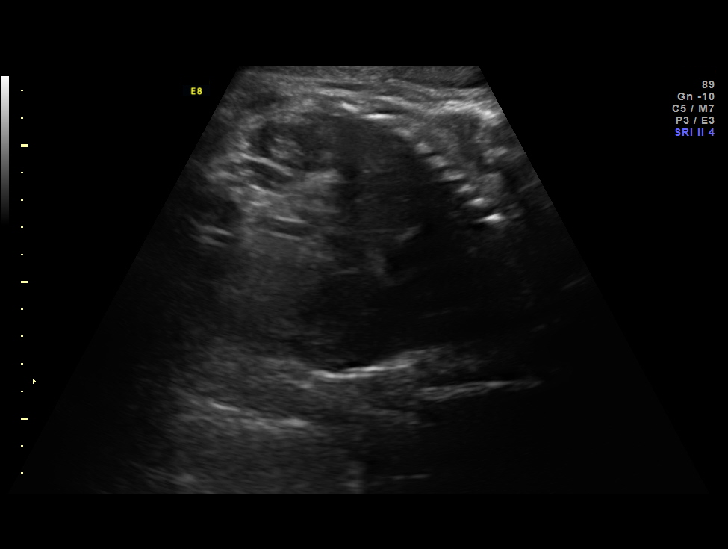

[13 of 16 positions shown; findings below may reference images not displayed]

OBSTETRICS REPORT
                      (Signed Final 10/14/2013 [DATE])

             TOBON

Service(s) Provided

 [HOSPITAL]                                         76815.0
Indications

 Diabetes - Gestational, A1 (diet controlled)
 Poor obstetric history: Previous macrosomia
Fetal Evaluation

 Num Of Fetuses:    1
 Fetal Heart Rate:  161                          bpm
 Cardiac Activity:  Observed
 Presentation:      Cephalic
 Placenta:          Anterior, above cervical os

 Amniotic Fluid
 AFI FV:      Subjectively within normal limits
 AFI Sum:     11.67   cm       34  %Tile     Larg Pckt:    3.59  cm
 RUQ:   3.59    cm   RLQ:    2.05   cm    LUQ:   2.99    cm   LLQ:    3.04   cm
Gestational Age

 LMP:           41w 0d        Date:  12/31/12                 EDD:   10/07/13
 Best:          35w 4d     Det. By:  Previous Ultrasound      EDD:   11/14/13
Cervix Uterus Adnexa

 Cervix:       Not visualized (advanced GA >38wks)
Impression

 Single IUP at 35w 4d
 Limited ultrasound performed for amniotic fluid volume
 assessment
 AFI 11.7 cm
 Reactive NST

 BPs: 146/78, repeat 143/88
Recommendations

 Patient sent to [facility] for serial BPs and preeclampsia labs
 Recommend continued antenatal testing as scheduled (2x
 weekly NSTs with weekly AFIs)
 Recommend delivery at 39 weeks in the absence of other
 complications

## 2015-06-12 IMAGING — US US OB FOLLOW-UP
1 series · 12 of 28 positions shown · non-contrast
Comparison: none

[Series 1: us ob follow-up · 0.23mm/px · 12 of 35 slices shown]
[im 2/35]
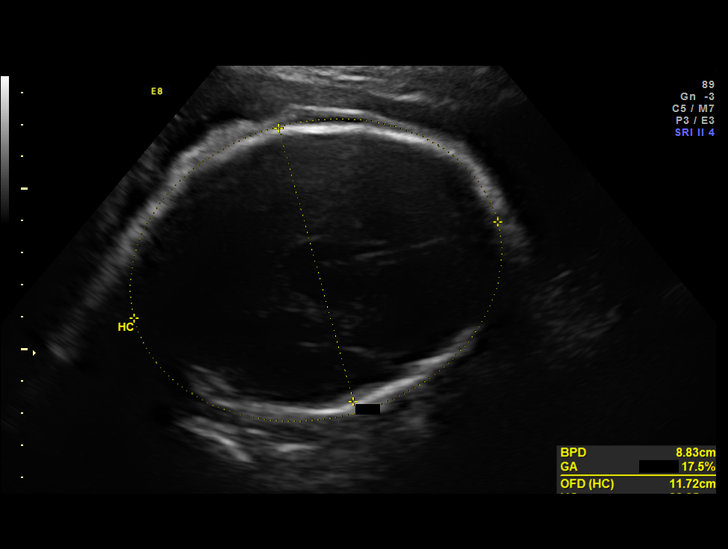
[im 4/35]
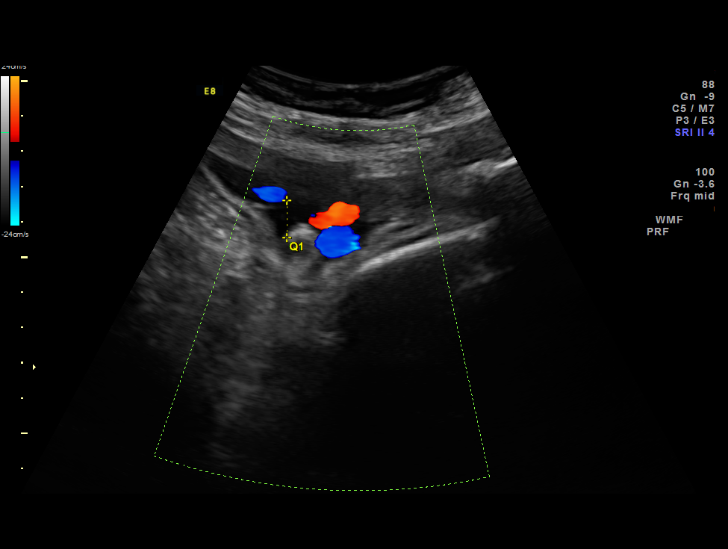
[im 7/35]
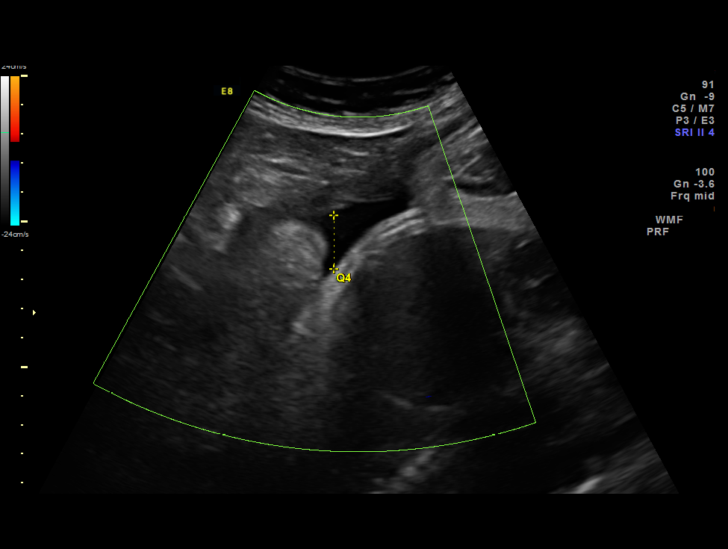
[im 11/35]
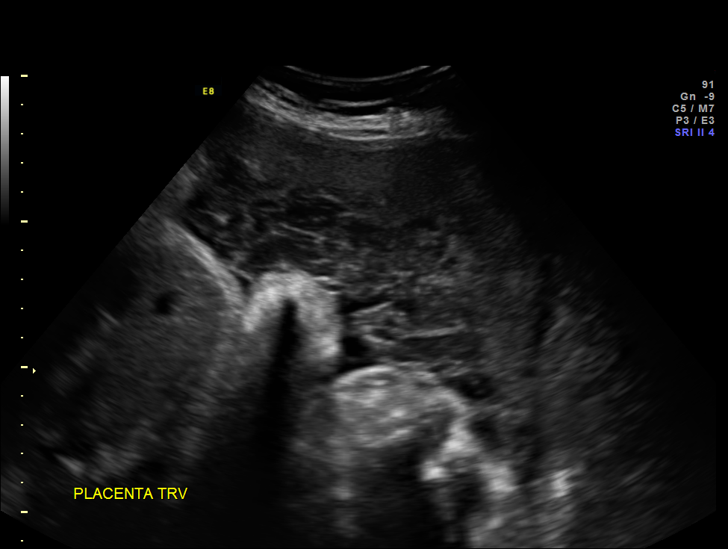
[im 13/35]
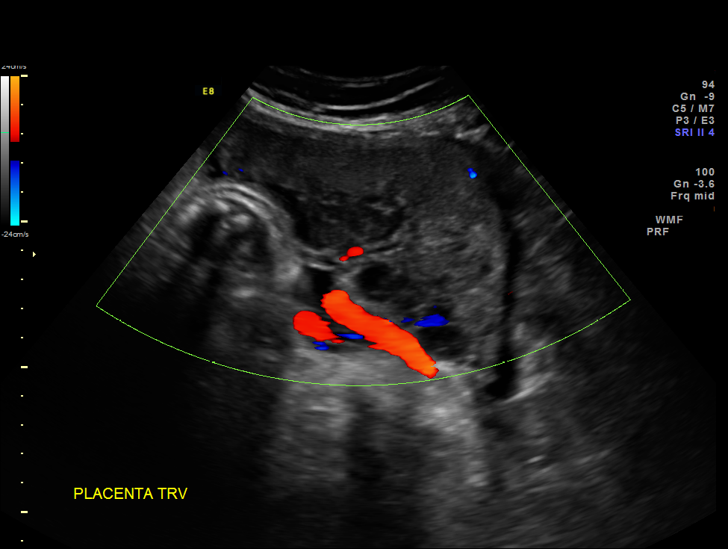
[im 16/35]
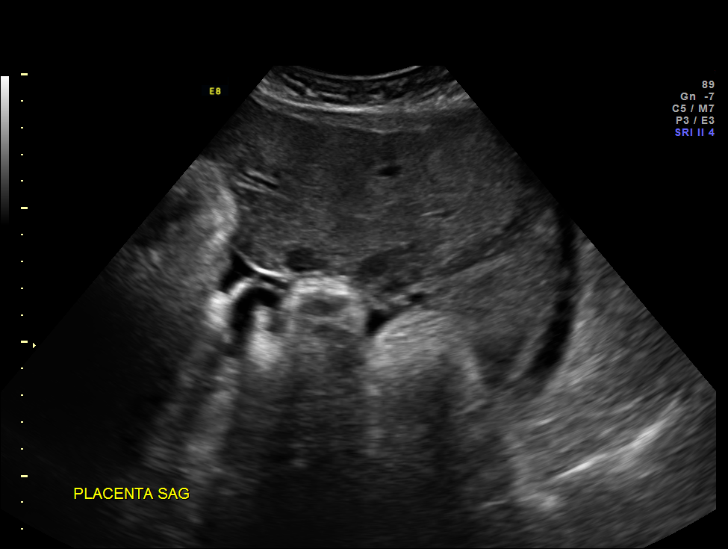
[im 19/35]
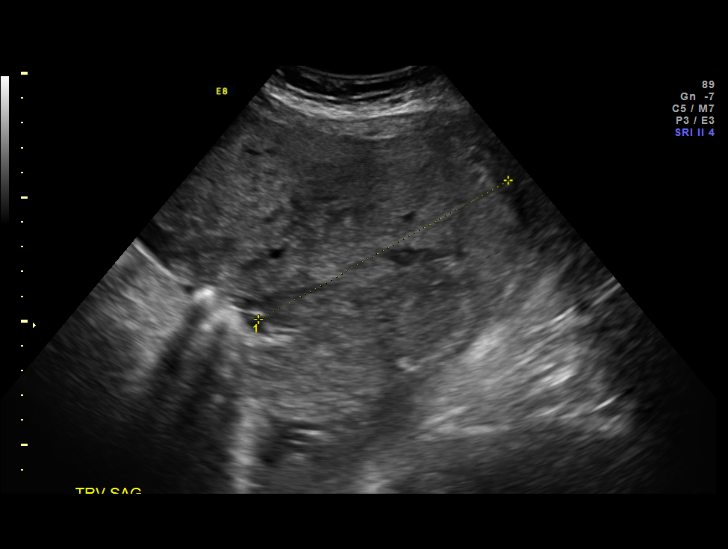
[im 22/35]
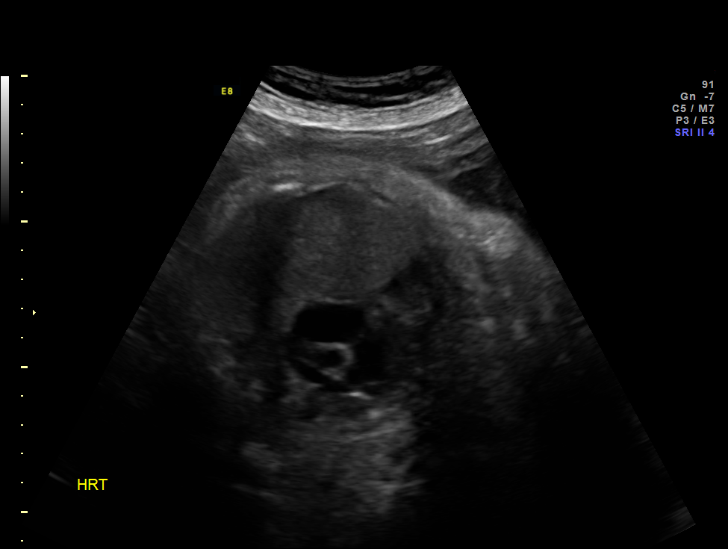
[im 24/35]
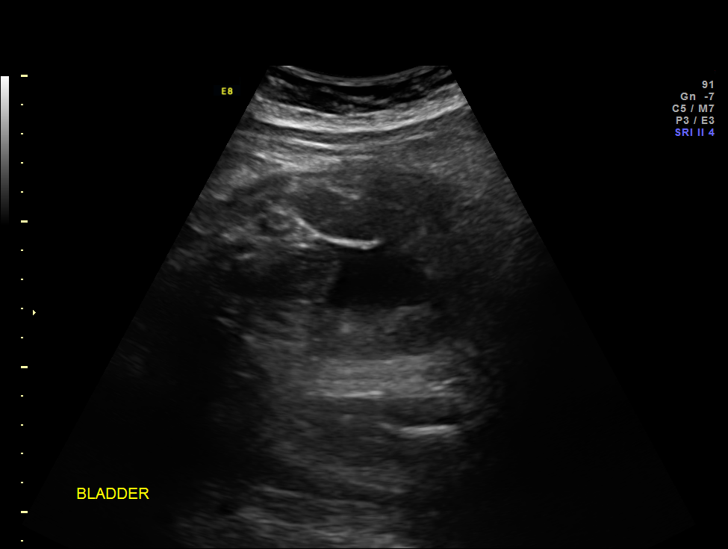
[im 28/35]
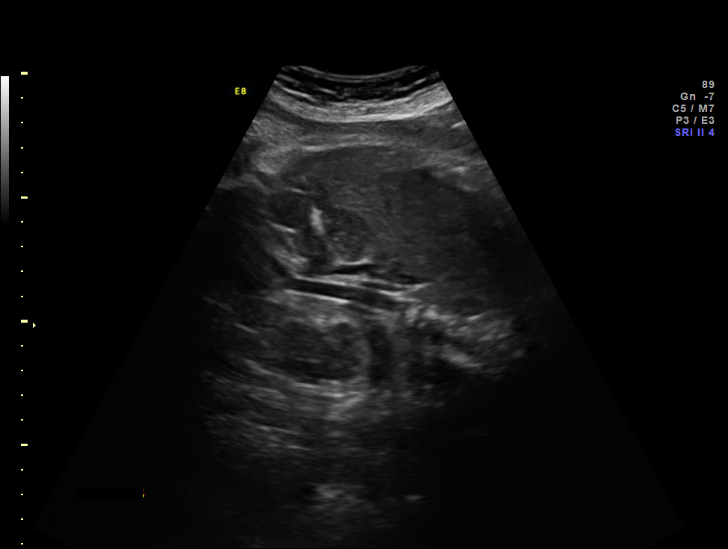
[im 31/35]
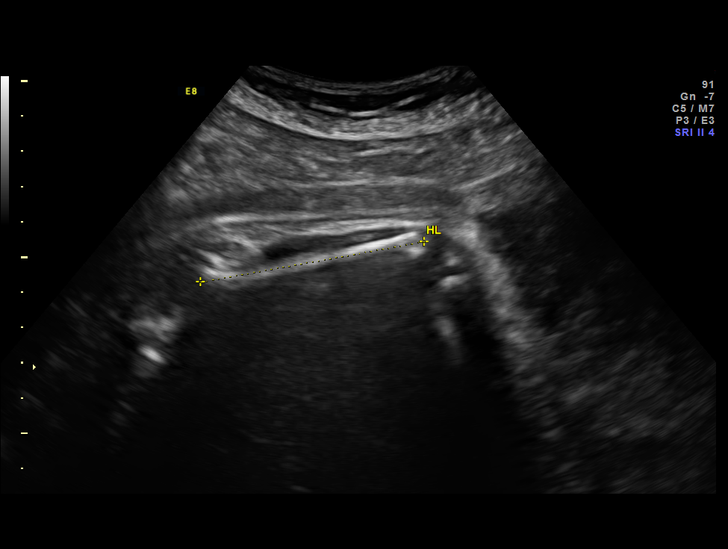
[im 33/35]
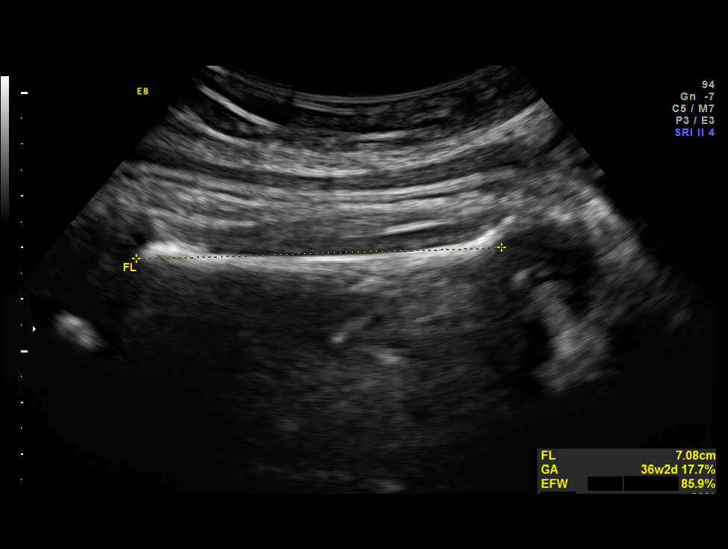

[12 of 28 positions shown; findings below may reference images not displayed]

OBSTETRICS REPORT
                      (Signed Final 10/29/2013 [DATE])

             [facility]

Service(s) Provided

 US OB FOLLOW UP                                       76816.1
Indications

 Diabetes - Gestational, A1 (diet controlled)
 Poor obstetric history: Previous macrosomia
Fetal Evaluation

 Num Of Fetuses:    1
 Fetal Heart Rate:  135                          bpm
 Cardiac Activity:  Observed
 Presentation:      Cephalic
 Placenta:          Anterior, above cervical os
 P. Cord            Previously Visualized
 Insertion:

 Amniotic Fluid
 AFI FV:      Subjectively within normal limits
 AFI Sum:     8.26    cm       12  %Tile     Larg Pckt:    3.91  cm
 RUQ:   3.91    cm   RLQ:    1.06   cm    LUQ:   1.95    cm   LLQ:    1.34   cm
Biometry

 BPD:     88.8  mm     G. Age:  35w 6d                CI:         76.5   70 - 86
 OFD:    116.1  mm                                    FL/HC:      21.5   20.9 -

 HC:     328.9  mm     G. Age:  37w 3d       22  %    HC/AC:      0.91   0.92 -

 AC:     360.8  mm     G. Age:  40w 0d     > 97  %    FL/BPD:     79.6   71 - 87
 FL:      70.7  mm     G. Age:  36w 2d       18  %    FL/AC:      19.6   20 - 24
 HUM:     64.1  mm     G. Age:  37w 1d       66  %

 Est. FW:    7060  gm    7 lb 11 oz      85  %
Gestational Age

 LMP:           43w 1d        Date:  12/31/12                 EDD:   10/07/13
 U/S Today:     37w 3d                                        EDD:   11/16/13
 Best:          37w 5d     Det. By:  Previous Ultrasound      EDD:   11/14/13
Anatomy
 Cranium:          Appears normal         LVOT:             Not well visualized
 Fetal Cavum:      Previously seen        Aortic Arch:      Not well visualized
 Ventricles:       Not well visualized    Ductal Arch:      Previously seen
 Choroid Plexus:   Not well visualized    Diaphragm:        Previously seen
 Cerebellum:       Not well visualized    Stomach:          Appears normal, left
                                                            sided
 Posterior Fossa:  Not well visualized    Abdomen:          Appears normal
 Nuchal Fold:      Not applicable (>20    Abdominal Wall:   Previously seen
                   wks GA)
 Face:             Not well visualized    Cord Vessels:     Previously seen
 Lips:             Not well visualized    Kidneys:          Appear normal
 Heart:            Previously seen        Bladder:          Appears normal
 RVOT:             Previously seen        Spine:            Not well visualized

 Other:  Technicallly difficult due to advanced GA and maternal habitus.
Cervix Uterus Adnexa

 Cervix:       Not visualized (advanced GA >19wks)
Comments

 Ms. Wuerges had a reactive NST in clinic today.
Impression

 Single living intrauterine pregnancy at 37 weeks 5 days.
 Appropriate interval fetal growth (85%).
 Normal amniotic fluid volume.
 Normal interval fetal anatomy.
 Reassuring modified BPP.
Recommendations

 Follow-up ultrasounds as clinically indicated.
 Continue twice weekly NST's and once weekly AFI's.
 Recommend delivery between 39-40 weeks.

                Berhad, Dexandra

## 2016-11-01 ENCOUNTER — Emergency Department (HOSPITAL_COMMUNITY)
Admission: EM | Admit: 2016-11-01 | Discharge: 2016-11-02 | Disposition: A | Discharge status: Home / Self Care | Payer: Self-pay | Attending: Emergency Medicine | Admitting: Emergency Medicine

## 2016-11-01 ENCOUNTER — Encounter (HOSPITAL_COMMUNITY): Payer: Self-pay | Admitting: *Deleted

## 2016-11-01 DIAGNOSIS — R519 Headache, unspecified: Secondary | ICD-10-CM

## 2016-11-01 DIAGNOSIS — R51 Headache: Secondary | ICD-10-CM | POA: Insufficient documentation

## 2016-11-01 DIAGNOSIS — I10 Essential (primary) hypertension: Secondary | ICD-10-CM | POA: Insufficient documentation

## 2016-11-01 LAB — BASIC METABOLIC PANEL
ANION GAP: 9 (ref 5–15)
BUN: 15 mg/dL (ref 6–20)
CALCIUM: 8.7 mg/dL — AB (ref 8.9–10.3)
CO2: 23 mmol/L (ref 22–32)
Chloride: 105 mmol/L (ref 101–111)
Creatinine, Ser: 0.59 mg/dL (ref 0.44–1.00)
GFR calc Af Amer: >60 mL/min (ref >60)
GLUCOSE: 118 mg/dL — AB (ref 65–99)
Potassium: 3.1 mmol/L — ABNORMAL LOW (ref 3.5–5.1)
Sodium: 137 mmol/L (ref 135–145)

## 2016-11-01 LAB — CBC
HCT: 39.8 % (ref 36.0–46.0)
HEMOGLOBIN: 12.6 g/dL (ref 12.0–15.0)
MCH: 24.7 pg — AB (ref 26.0–34.0)
MCHC: 31.7 g/dL (ref 30.0–36.0)
MCV: 78 fL (ref 78.0–100.0)
Platelets: 302 10*3/uL (ref 150–400)
RBC: 5.1 MIL/uL (ref 3.87–5.11)
RDW: 14.8 % (ref 11.5–15.5)
WBC: 8.4 10*3/uL (ref 4.0–10.5)

## 2016-11-01 LAB — I-STAT BETA HCG BLOOD, ED (MC, WL, AP ONLY): I-stat hCG, quantitative: <5 m[IU]/mL (ref <5)

## 2016-11-01 LAB — CBG MONITORING, ED: GLUCOSE-CAPILLARY: 121 mg/dL — AB (ref 65–99)

## 2016-11-01 MED ORDER — ONDANSETRON 4 MG PO TBDP
4.0000 mg | ORAL_TABLET | Freq: Once | ORAL | Status: AC | PRN
  Administered 2016-11-01: 4 mg via ORAL

## 2016-11-01 MED ORDER — ONDANSETRON 4 MG PO TBDP
ORAL_TABLET | ORAL | Status: AC
  Filled 2016-11-01: qty 1

## 2016-11-01 NOTE — ED Triage Notes (Signed)
Spanish interpreter used during tirage: Pt c/o headache x 4 days with emesis and photosensitivity. Pt reports generalized weakness; hx of htn, is not currently taking medications for htn

## 2016-11-02 ENCOUNTER — Emergency Department (HOSPITAL_COMMUNITY): Payer: Self-pay

## 2016-11-02 MED ORDER — DIPHENHYDRAMINE HCL 50 MG/ML IJ SOLN
25.0000 mg | Freq: Once | INTRAMUSCULAR | Status: AC
  Administered 2016-11-02: 25 mg via INTRAVENOUS
  Filled 2016-11-02: qty 1

## 2016-11-02 MED ORDER — IOPAMIDOL (ISOVUE-370) INJECTION 76%
INTRAVENOUS | Status: AC
  Administered 2016-11-02: 50 mL
  Filled 2016-11-02: qty 50

## 2016-11-02 MED ORDER — VALPROATE SODIUM 500 MG/5ML IV SOLN
500.0000 mg | Freq: Once | INTRAVENOUS | Status: AC
  Administered 2016-11-02: 500 mg via INTRAVENOUS
  Filled 2016-11-02: qty 5

## 2016-11-02 MED ORDER — PROCHLORPERAZINE EDISYLATE 5 MG/ML IJ SOLN
10.0000 mg | Freq: Once | INTRAMUSCULAR | Status: AC
  Administered 2016-11-02: 10 mg via INTRAVENOUS
  Filled 2016-11-02: qty 2

## 2016-11-02 MED ORDER — SODIUM CHLORIDE 0.9 % IV BOLUS (SEPSIS)
1000.0000 mL | Freq: Once | INTRAVENOUS | Status: AC
  Administered 2016-11-02: 1000 mL via INTRAVENOUS

## 2016-11-02 MED ORDER — KETOROLAC TROMETHAMINE 30 MG/ML IJ SOLN
30.0000 mg | Freq: Once | INTRAMUSCULAR | Status: AC
  Administered 2016-11-02: 30 mg via INTRAVENOUS
  Filled 2016-11-02: qty 1

## 2016-11-02 MED ORDER — HYDROMORPHONE HCL 1 MG/ML IJ SOLN
0.5000 mg | Freq: Once | INTRAMUSCULAR | Status: AC
  Administered 2016-11-02: 0.5 mg via INTRAVENOUS
  Filled 2016-11-02: qty 1

## 2016-11-02 NOTE — ED Notes (Signed)
ED Provider at bedside. 

## 2016-11-02 NOTE — ED Provider Notes (Signed)
[facility]-EMERGENCY DEPT Provider Note   CSN: [id number] Arrival date & time: 11/01/16  2003     History   Chief Complaint Chief Complaint  Patient presents with  . Headache    HPI Nicole Parker is a 36 y.o. female.  Patient presents to the emergency department for evaluation of headache. She reports that the headache first began 4 days ago. She woke up with a headache, it slowly and progressively worsened. She reports that it felt like headaches that she has had in the past with elevated blood pressure. She has a history of hypertension but is not currently on any medications. Today, however, the headache worsened and has now become associated with light sensitivity, nausea, vomiting. Pain worsens when she turns her head. Pain is throbbing and sharp on the right side.      Past Medical History:  Diagnosis Date  . Gestational diabetes   . Hypertension   . Pregnancy induced hypertension     Patient Active Problem List   Diagnosis Date Noted  . NVD (normal vaginal delivery) 11/03/2013  . Active labor 11/02/2013  . Preeclampsia 10/14/2013    Past Surgical History:  Procedure Laterality Date  . NO PAST SURGERIES      OB History    Gravida Para Term Preterm AB Living   SAB TAB Ectopic Multiple Live Births       1   4       Home Medications    Prior to Admission medications   Medication Sig Start Date End Date Taking? Authorizing Provider  ibuprofen (ADVIL,MOTRIN) 200 MG tablet Take 800 mg by mouth every 6 (six) hours as needed for moderate pain.   Yes [provider]    Family History No family history on file.  Social History Social History  Substance Use Topics  . Smoking status: Never Smoker  . Smokeless tobacco: Never Used  . Alcohol use No     Allergies   Patient has no known allergies.   Review of Systems Review of Systems  Eyes: Positive for photophobia.  Gastrointestinal: Positive for nausea and vomiting.    Neurological: Positive for headaches.  All other systems reviewed and are negative.    Physical Exam Updated Vital Signs BP 102/73   Pulse 92   Temp 98.1 F (36.7 C) (Oral)   Resp 18   LMP 10/06/2016   SpO2 95%   Physical Exam  Constitutional: She is oriented to person, place, and time. She appears well-developed and well-nourished. No distress.  HENT:  Head: Normocephalic and atraumatic.  Right Ear: Hearing normal.  Left Ear: Hearing normal.  Nose: Nose normal.  Mouth/Throat: Oropharynx is clear and moist and mucous membranes are normal.  Eyes: Pupils are equal, round, and reactive to light. Conjunctivae and EOM are normal.  Neck: Normal range of motion. Neck supple.  Cardiovascular: Regular rhythm, S1 normal and S2 normal.  Exam reveals no gallop and no friction rub.   No murmur heard. Pulmonary/Chest: Effort normal and breath sounds normal. No respiratory distress. She exhibits no tenderness.  Abdominal: Soft. Normal appearance and bowel sounds are normal. There is no hepatosplenomegaly. There is no tenderness. There is no rebound, no guarding, no tenderness at McBurney's point and negative Murphy's sign. No hernia.  Musculoskeletal: Normal range of motion.  Neurological: She is alert and oriented to person, place, and time. She has normal strength. No cranial nerve deficit or sensory deficit. Coordination normal.  GCS eye subscore is 4. GCS verbal subscore is 5. GCS motor subscore is 6.  Skin: Skin is warm, dry and intact. No rash noted. No cyanosis.  Psychiatric: She has a normal mood and affect. Her speech is normal and behavior is normal. Thought content normal.  Nursing note and vitals reviewed.    ED Treatments / Results  Labs (all labs ordered are listed, but only abnormal results are displayed) Labs Reviewed  BASIC METABOLIC PANEL - Abnormal; Notable for the following:       Result Value   Potassium 3.1 (*)    Glucose, Bld 118 (*)    Calcium 8.7 (*)    All  other components within normal limits  CBC - Abnormal; Notable for the following:    MCH 24.7 (*)    All other components within normal limits  CBG MONITORING, ED - Abnormal; Notable for the following:    Glucose-Capillary 121 (*)    All other components within normal limits  I-STAT BETA HCG BLOOD, ED ([facility], [facility], [facility] ONLY)    EKG  EKG Interpretation None       Radiology Ct Angio Head W Or Wo Contrast  Result Date: 11/02/2016 CLINICAL DATA:  36 y/o F; 4 days of headache with emesis and photosensitivity. EXAM: CT ANGIOGRAPHY HEAD TECHNIQUE: Multidetector CT imaging of the head was performed using the standard protocol during bolus administration of intravenous contrast. Multiplanar CT image reconstructions and MIPs were obtained to evaluate the vascular anatomy. CONTRAST:  50 cc Isovue 370 COMPARISON:  None. FINDINGS: CT HEAD Brain: No evidence of acute infarction, hemorrhage, hydrocephalus, extra-axial collection or mass lesion/mass effect. Vascular: No hyperdense vessel or unexpected calcification. Skull: Normal. Negative for fracture or focal lesion. Sinuses: Mild diffuse paranasal sinus mucosal thickening. Normal aeration of mastoid air cells. Orbits: Orbits are unremarkable. CTA HEAD Anterior circulation: No significant stenosis, proximal occlusion, aneurysm, or vascular malformation. Posterior circulation: No significant stenosis, proximal occlusion, aneurysm, or vascular malformation. Venous sinuses: As permitted by contrast timing, patent. Anatomic variants: Bilateral fetal PCA. Anterior communicating artery. Delayed phase: No abnormal intracranial enhancement. IMPRESSION: 1. No acute intracranial abnormality or abnormal enhancement of the brain. 2. Patent circle of Willis. No significant stenosis, proximal occlusion, aneurysm, or vascular malformation identified. Electronically Signed   By: Mitzi Hansen M.D.   On: 11/02/2016 03:12    Procedures Procedures (including critical  care time)  Medications Ordered in ED Medications  ondansetron (ZOFRAN-ODT) 4 MG disintegrating tablet (not administered)  ondansetron (ZOFRAN-ODT) disintegrating tablet 4 mg (4 mg Oral Given 11/01/16 2042)  sodium chloride 0.9 % bolus 1,000 mL (0 mLs Intravenous Stopped 11/02/16 0259)  ketorolac (TORADOL) 30 MG/ML injection 30 mg (30 mg Intravenous Given 11/02/16 0135)  prochlorperazine (COMPAZINE) injection 10 mg (10 mg Intravenous Given 11/02/16 0136)  diphenhydrAMINE (BENADRYL) injection 25 mg (25 mg Intravenous Given 11/02/16 0136)  iopamidol (ISOVUE-370) 76 % injection (50 mLs  Contrast Given 11/02/16 0221)  valproate (DEPACON) 500 mg in dextrose 5 % 50 mL IVPB (0 mg Intravenous Stopped 11/02/16 0648)  HYDROmorphone (DILAUDID) injection 0.5 mg (0.5 mg Intravenous Given 11/02/16 0301)     Initial Impression / Assessment and Plan / ED Course  I have reviewed the triage vital signs and the nursing notes.  Pertinent labs & imaging results that were available during my care of the patient were reviewed by me and considered in my medical decision making (see chart for details).    Patient presents to the emergency department for headache.  Patient has had progressively worsening headache over several days. She does report a history of headache with elevated blood pressure. Patient had pressure was markedly elevated at arrival. It did improve without intervention, however, and her headache did not change. Workup was therefore performed. Blood work unremarkable. CT angios of head did not show any aneurysms or evidence of intracranial abnormality. Patient has had resolution of her headache with treatment here in the ER. She does have some mild dizziness now, likely secondary to the medications. As she is improved, blood pressure is now normal, she is appropriate for discharge and follow-up with primary care.  Final Clinical Impressions(s) / ED Diagnoses   Final diagnoses:  Bad headache    New  Prescriptions New Prescriptions   No medications on file     Gilda Crease, MD 11/02/16 [phone]

## 2016-11-02 NOTE — ED Notes (Signed)
Patient transported to CT 

## 2016-12-20 ENCOUNTER — Encounter: Payer: Self-pay | Admitting: Obstetrics & Gynecology

## 2019-04-05 ENCOUNTER — Other Ambulatory Visit: Payer: Self-pay

## 2019-04-05 ENCOUNTER — Encounter: Payer: Self-pay | Admitting: Internal Medicine

## 2019-04-05 ENCOUNTER — Ambulatory Visit: Payer: Self-pay | Attending: Internal Medicine | Admitting: Internal Medicine

## 2019-04-05 VITALS — BP 170/107 | HR 76 | Temp 97.7°F | Resp 16 | Ht 64.0 in | Wt 245.8 lb

## 2019-04-05 DIAGNOSIS — I10 Essential (primary) hypertension: Secondary | ICD-10-CM | POA: Insufficient documentation

## 2019-04-05 DIAGNOSIS — D509 Iron deficiency anemia, unspecified: Secondary | ICD-10-CM

## 2019-04-05 DIAGNOSIS — Z7689 Persons encountering health services in other specified circumstances: Secondary | ICD-10-CM

## 2019-04-05 DIAGNOSIS — Z124 Encounter for screening for malignant neoplasm of cervix: Secondary | ICD-10-CM

## 2019-04-05 DIAGNOSIS — Z5320 Procedure and treatment not carried out because of patient's decision for unspecified reasons: Secondary | ICD-10-CM

## 2019-04-05 DIAGNOSIS — Z6841 Body Mass Index (BMI) 40.0 and over, adult: Secondary | ICD-10-CM | POA: Insufficient documentation

## 2019-04-05 MED ORDER — AMLODIPINE BESYLATE 5 MG PO TABS: 5.0000 mg | ORAL_TABLET | Freq: Every day | ORAL | 3 refills | Status: DC

## 2019-04-05 NOTE — Progress Notes (Signed)
Patient ID: Nicole Parker, female    DOB: 01-11-1981  MRN: [id number]  CC: New Patient (Initial Visit) and Hypertension   Subjective: Nicole Parker is a 39 y.o. female who presents for new pt visit. Reports no previous PCP Her concerns today include:  Previous hx of COVID infection 03/2018, HTN  Pt gives hx of HTN.   -she was on med but never went back for f/u No device to check BP.  She uses a little salt in the foods No CP/SOB/chronic HA.  Little LE edema  Pt wanting pap. Pt is G4P4 Last pap was 3-4 yrs ago. No abn paps in past. No abnormal vaginal dischg Menses are regular lasting 4-5 days.  + cramping with menses.  Bleeding is heavy for first 3 days with a lot of clots. Has to change pads every 2 hrs for the first 3 days Sexually active with one female partner.  Uses condoms No fhx of ovarian, uterine or breast CA  Past medical, social, family history and surgical history reviewed and updated in the system.  Patient Active Problem List   Diagnosis Date Noted  . Class 3 severe obesity due to excess calories with serious comorbidity and body mass index (BMI) of 40.0 to 44.9 in adult (HCC) 04/05/2019  . Essential hypertension 04/05/2019  . NVD (normal vaginal delivery) 11/03/2013  . Active labor 11/02/2013  . Preeclampsia 10/14/2013     Current Outpatient Medications on File Prior to Visit  Medication Sig Dispense Refill  . ibuprofen (ADVIL,MOTRIN) 200 MG tablet Take 800 mg by mouth every 6 (six) hours as needed for moderate pain.     No current facility-administered medications on file prior to visit.    No Known Allergies  Social History   Socioeconomic History  . Marital status: Married    Spouse name: Not on file  . Number of children: 4  . Years of education: 9 th grade  . Highest education level: Not on file  Occupational History  . Occupation: unemployed  Tobacco Use  . Smoking status: Never Smoker  . Smokeless tobacco: Never Used    Substance and Sexual Activity  . Alcohol use: No  . Drug use: No  . Sexual activity: Yes    Birth control/protection: None  Other Topics Concern  . Not on file  Social History Narrative   She is able to read in spanish   Social Determinants of Health   Financial Resource Strain:   . Difficulty of Paying Living Expenses: Not on file  Food Insecurity:   . Worried About Programme researcher, broadcasting/film/video in the Last Year: Not on file  . Ran Out of Food in the Last Year: Not on file  Transportation Needs:   . Lack of Transportation (Medical): Not on file  . Lack of Transportation (Non-Medical): Not on file  Physical Activity:   . Days of Exercise per Week: Not on file  . Minutes of Exercise per Session: Not on file  Stress:   . Feeling of Stress : Not on file  Social Connections:   . Frequency of Communication with Friends and Family: Not on file  . Frequency of Social Gatherings with Friends and Family: Not on file  . Attends Religious Services: Not on file  . Active Member of Clubs or Organizations: Not on file  . Attends Banker Meetings: Not on file  . Marital Status: Not on file  Intimate Partner Violence:   . Fear of Current  or Ex-Partner: Not on file  . Emotionally Abused: Not on file  . Physically Abused: Not on file  . Sexually Abused: Not on file    History reviewed. No pertinent family history.  Past Surgical History:  Procedure Laterality Date  . NO PAST SURGERIES    . NO PAST SURGERIES      ROS: Review of Systems Negative except as stated above  PHYSICAL EXAM: BP (!) 170/107   Pulse 76   Temp 97.7 F (36.5 C)   Resp 16   Ht 5\' 4"  (1.626 m)   Wt 245 lb 12.8 oz (111.5 kg)   LMP 03/23/2019   SpO2 96%   BMI 42.19 kg/m   Wt Readings from Last 3 Encounters:  04/05/19 245 lb 12.8 oz (111.5 kg)  11/04/13 234 lb 9.6 oz (106.4 kg)  10/29/13 258 lb (117 kg)    Physical Exam  General appearance - alert, well appearing, obese hispanic female and in  no distress Mental status - normal mood, behavior, speech, dress, motor activity, and thought processes Eyes - pupils equal and reactive, extraocular eye movements intact Mouth - mucous membranes moist, pharynx normal without lesions Neck - supple, no significant adenopathy Chest - clear to auscultation, no wheezes, rales or rhonchi, symmetric air entry Heart - normal rate, regular rhythm, normal S1, S2, no murmurs, rubs, clicks or gallops Abdomen - soft, nontender, nondistended, no masses or organomegaly Pelvic - CMA Pollock present: noted to be on cycle so pap deferred Extremities - no LE edema   CMP Latest Ref Rng & Units 11/01/2016 11/02/2013 10/14/2013  Glucose 65 - 99 mg/dL 118(H) 67(L) 78  BUN 6 - 20 mg/dL 15 - 9  Creatinine 0.44 - 1.00 mg/dL 0.59 - 0.50  Sodium 135 - 145 mmol/L 137 - 139  Potassium 3.5 - 5.1 mmol/L 3.1(L) - 3.5(L)  Chloride 101 - 111 mmol/L 105 - 104  CO2 22 - 32 mmol/L 23 - 20  Calcium 8.9 - 10.3 mg/dL 8.7(L) - 8.1(L)  Total Protein 6.0 - 8.3 g/dL - - 5.7(L)  Total Bilirubin 0.3 - 1.2 mg/dL - - 0.2(L)  Alkaline Phos 39 - 117 U/L - - 100  AST 0 - 37 U/L - - 18  ALT 0 - 35 U/L - - 17   Lipid Panel  No results found for: CHOL, TRIG, HDL, CHOLHDL, VLDL, LDLCALC, LDLDIRECT  CBC    Component Value Date/Time   WBC 8.4 11/01/2016 2040   RBC 5.10 11/01/2016 2040   HGB 12.6 11/01/2016 2040   HCT 39.8 11/01/2016 2040   PLT 302 11/01/2016 2040   MCV 78.0 11/01/2016 2040   MCH 24.7 (L) 11/01/2016 2040   MCHC 31.7 11/01/2016 2040   RDW 14.8 11/01/2016 2040    ASSESSMENT AND PLAN: 1. Encounter to establish care 2. Essential hypertension Not at goal DASH discussed and encouraged Start Norvasc - CBC - Comprehensive metabolic panel - amLODipine (NORVASC) 5 MG tablet; Take 1 tablet (5 mg total) by mouth daily.  Dispense: 30 tablet; Refill: 3  3. Class 3 severe obesity due to excess calories with serious comorbidity and body mass index (BMI) of 40.0 to 44.9 in  adult [facility]) -discussed healthy eating habits.  Printed info given. Encouraged regular moderate intensity exercise at least 3 to 4 days a week for 30 minutes. - Lipid panel - Hemoglobin A1c  5. HIV screening declined - HIV Antibody (routine testing w rflx)  Pap smear was deferred as patient has just started  her menses.  She will follow-up in 6 weeks at which time we will recheck blood pressure and do a Pap smear.  Patient was given the opportunity to ask questions.  Patient verbalized understanding of the plan and was able to repeat key elements of the plan.  [employer] interpreter used during this encounter.#[id number]   Orders Placed This Encounter  Procedures  . CBC  . Comprehensive metabolic panel  . Lipid panel  . Hemoglobin A1c  . HIV Antibody (routine testing w rflx)     Requested Prescriptions   Signed Prescriptions Disp Refills  . amLODipine (NORVASC) 5 MG tablet 30 tablet 3    Sig: Take 1 tablet (5 mg total) by mouth daily.    Return in about 6 weeks (around 05/17/2019) for PAP.  Jonah Blue, MD, FACP

## 2019-04-05 NOTE — Patient Instructions (Signed)
Obesidad en los adultos Obesity, Adult La obesidad es un exceso de grasa corporal. Ser obeso significa que su peso es ms de lo que es saludable para usted. El IMC es un nmero que indica la cantidad de grasa corporal que tiene una persona. Si usted tiene un ndice de masa corporal (IMC) de 30o ms, esto significa que es obeso. A menudo, la causa de la obesidad es comer o beber ms caloras que las que el cuerpo usa. Cambiar el estilo de vida puede ayudarlo a bajar de peso. La obesidad puede causar problemas de salud graves, como los siguientes:  Accidente cerebrovascular.  Arteriopata coronaria (EAC).  Diabetes tipo 2.  Algunos tipos de cncer, incluido el cncer de colon, mama, tero y vescula.  Artrosis.  Presin arterial alta (hipertensin arterial).  Colesterol alto.  Apnea del sueo.  Clculos en la vescula.  Problemas de esterilidad. Cules son las causas?  Consumir todos los das alimentos con altos niveles de caloras, azcar y grasa.  Nacer con genes que pueden hacerlo ms propenso a ser obeso.  Tener una afeccin mdica que causa obesidad.  Tomar ciertos medicamentos.  Permanecer mucho tiempo sentado (tener un estilo de vida sedentario).  No dormir lo suficiente.  Beber gran cantidad de bebidas con azcar. Qu incrementa el riesgo?  Tener antecedentes familiares de obesidad.  Ser mujer afroamericana.  Ser hombre de origen hispano.  Vivir en un rea con acceso limitado a las siguientes posibilidades: ? Parques, centros recreativos o veredas. ? Alimentos saludables, como se venden en tiendas de comestibles y mercados de agricultores. Cules son los signos o los sntomas? El principal signo es tener demasiada grasa corporal. Cmo se trata?  El tratamiento de esta afeccin frecuentemente incluye cambiar el estilo de vida. El tratamiento puede incluir: ? Cambios en la dieta. Esto puede incluir crear un plan de alimentacin saludable. ? Realizar  actividad fsica. Puede incluir una actividad que hace que el corazn lata ms rpido (ejercicio aerbico) y entrenamiento de fuerza. Trabaje con su mdico para disear un programa que funcione para usted. ? Medicamentos para ayudarlo a perder peso. Pueden utilizarse si no puede perder 1 libra (450g) por semana despus de 6 semanas de alimentacin saludable y ms ejercicio. ? Tratar las afecciones que causan la obesidad. ? Ciruga. Las opciones pueden incluir bandas gstricas y bypass gstrico. Esto puede realizarse en las siguientes situaciones:  Otros tratamientos no mejoraron su afeccin.  Tiene un IMC de 40 o superior.  Tiene problemas de salud potencialmente mortales relacionados con la obesidad. Siga estas instrucciones en su casa: Comida y bebida   Siga las instrucciones del mdico respecto de las comidas y las bebidas. Su mdico puede recomendarle lo siguiente: ? Limitar las comidas rpidas, los dulces y las colaciones procesadas. ? Elegir opciones con bajo contenido de grasas. Por ejemplo, leche descremada en lugar de leche entera. ? Consumir 5o ms porciones de frutas o verduras por da. ? Comer en casa con ms frecuencia. Esto le da ms control sobre lo que come. ? Cuando coma afuera, elija alimentos saludables. ? Aprenda a leer las etiquetas de los alimentos. Esto le ayudar a aprender qu cantidad de alimento hay en una porcin. ? Tenga a mano colaciones con bajo contenido de grasas. ? Evite las bebidas que contengan mucha azcar. Estas incluyen refrescos, jugo de frutas, t helado con azcar y leche saborizada.  Beba suficiente agua para mantener el pis (la orina) de color amarillo plido.  No siga las dietas de moda. Actividad   fsica  Haga ejercicios con frecuencia, como se lo haya indicado el mdico. La mayora de los adultos deben hacer hasta 150minutos de ejercicio de intensidad moderada cada semana.Pregntele al mdico lo siguiente: ? Los tipos de ejercicios que  son seguros para usted. ? La frecuencia con la que debe hacer los ejercicios.  Precaliente y elongue adecuadamente antes de hacer actividad fsica.  Haga un estiramiento lento despus de la actividad (relajacin).  Descanse entre los perodos de actividad. Estilo de vida  Trabaje con su mdico y con un experto en alimentacin (nutricionista) para establecer un objetivo de prdida de peso que sea adecuado para usted.  Limite el tiempo que pasa frente a una pantalla.  Busque formas de recompensarse que no incluyan alimentos.  No beba alcohol si: ? El mdico le indica que no lo haga. ? Est embarazada, puede estar embarazada o est tratando de quedar embarazada.  Si bebe alcohol: ? Limite la cantidad que bebe a lo siguiente:  De 0 a 1 medida por da para las mujeres.  De 0 a 2 medidas por da para los hombres. ? Est atento a la cantidad de alcohol que hay en las bebidas que toma. En los [place], una medida equivale a una botella de cerveza de 12oz (355ml), un vaso de vino de 5oz (148ml) o un vaso de una bebida alcohlica de alta graduacin de 1oz (44ml). Instrucciones generales  Lleve un diario de su prdida de peso. Esto puede ayudarlo a mantener un registro de lo siguiente: ? Los alimentos que come. ? Cunto ejercicio realiza.  Tome los medicamentos de venta libre y los recetados solamente como se lo haya indicado el mdico.  Tome vitaminas y suplementos solamente como se lo haya indicado el mdico.  Considere participar en un grupo de apoyo.  Concurra a todas las visitas de seguimiento como se lo haya indicado el mdico. Esto es importante. Comunquese con un mdico si:  No puede alcanzar su objetivo de prdida de peso despus de haber modificado su dieta y su estilo de vida durante 6semanas. Solicite ayuda inmediatamente si:  Tiene dificultad para respirar.  Tiene pensamientos acerca de lastimarse. Resumen  La obesidad es un exceso de grasa  corporal.  Ser obeso significa que su peso es ms de lo que es saludable para usted.  Trabaje con su mdico para establecer un objetivo de prdida de peso.  Haga actividad fsica con regularidad tal como le indic el mdico. Esta informacin no tiene como fin reemplazar el consejo del mdico. Asegrese de hacerle al mdico cualquier pregunta que tenga. Document Revised: 10/16/2017 Document Reviewed: 10/16/2017 Elsevier Patient Education  2020 Elsevier Inc.  

## 2019-04-06 LAB — LIPID PANEL
Chol/HDL Ratio: 4.3 ratio (ref 0.0–4.4)
Cholesterol, Total: 136 mg/dL (ref 100–199)
HDL: 32 mg/dL — ABNORMAL LOW (ref >39)
LDL Chol Calc (NIH): 68 mg/dL (ref 0–99)
Triglycerides: 218 mg/dL — ABNORMAL HIGH (ref 0–149)
VLDL Cholesterol Cal: 36 mg/dL (ref 5–40)

## 2019-04-06 LAB — COMPREHENSIVE METABOLIC PANEL
ALT: 12 IU/L (ref 0–32)
AST: 12 IU/L (ref 0–40)
Albumin/Globulin Ratio: 1.2 (ref 1.2–2.2)
Albumin: 4.1 g/dL (ref 3.8–4.8)
Alkaline Phosphatase: 92 IU/L (ref 39–117)
BUN/Creatinine Ratio: 15 (ref 9–23)
BUN: 11 mg/dL (ref 6–20)
Bilirubin Total: 0.4 mg/dL (ref 0.0–1.2)
CO2: 21 mmol/L (ref 20–29)
Calcium: 9.3 mg/dL (ref 8.7–10.2)
Chloride: 100 mmol/L (ref 96–106)
Creatinine, Ser: 0.73 mg/dL (ref 0.57–1.00)
GFR calc Af Amer: 121 mL/min/{1.73_m2} (ref >59)
GFR calc non Af Amer: 105 mL/min/{1.73_m2} (ref >59)
Globulin, Total: 3.4 g/dL (ref 1.5–4.5)
Glucose: 159 mg/dL — ABNORMAL HIGH (ref 65–99)
Potassium: 3.6 mmol/L (ref 3.5–5.2)
Sodium: 138 mmol/L (ref 134–144)
Total Protein: 7.5 g/dL (ref 6.0–8.5)

## 2019-04-06 LAB — CBC
Hematocrit: 35.8 % (ref 34.0–46.6)
Hemoglobin: 11.6 g/dL (ref 11.1–15.9)
MCH: 23.1 pg — ABNORMAL LOW (ref 26.6–33.0)
MCHC: 32.4 g/dL (ref 31.5–35.7)
MCV: 71 fL — ABNORMAL LOW (ref 79–97)
Platelets: 386 10*3/uL (ref 150–450)
RBC: 5.03 x10E6/uL (ref 3.77–5.28)
RDW: 15.7 % — ABNORMAL HIGH (ref 11.7–15.4)
WBC: 6.4 10*3/uL (ref 3.4–10.8)

## 2019-04-06 LAB — HEMOGLOBIN A1C
Est. average glucose Bld gHb Est-mCnc: 140 mg/dL
Hgb A1c MFr Bld: 6.5 % — ABNORMAL HIGH (ref 4.8–5.6)

## 2019-04-06 LAB — HIV ANTIBODY (ROUTINE TESTING W REFLEX): HIV Screen 4th Generation wRfx: NONREACTIVE

## 2019-04-07 ENCOUNTER — Other Ambulatory Visit: Payer: Self-pay | Admitting: Internal Medicine

## 2019-04-07 DIAGNOSIS — E119 Type 2 diabetes mellitus without complications: Secondary | ICD-10-CM

## 2019-04-07 MED ORDER — METFORMIN HCL 500 MG PO TABS: 500.0000 mg | ORAL_TABLET | Freq: Every day | ORAL | 3 refills | Status: DC

## 2019-04-07 MED FILL — metFORMIN HCL 500 MG TABS: 500 | 30 days supply | Qty: 30 | Fill #0

## 2019-04-07 NOTE — Progress Notes (Signed)
Let patient know that her kidney and liver function tests are normal.  She tested positive for diabetes.  Healthy eating habits and regular exercise will help to control it.  I also recommend starting a medication called Metformin to take once a day.  I have sent that prescription to our pharmacy.  She will need to pick it up.  She has mild elevation in triglyceride level.  Healthy eating habits and regular exercise will help to lower cholesterol.  HIV screening test was negative.  Blood count is in the low normal range.  I have asked the lab to check iron level.  I will let her know the results when I see her on her follow-up visit.

## 2019-04-07 NOTE — Addendum Note (Signed)
Addended by: Jonah Blue B on: 04/07/2019 10:52 AM   Modules accepted: Orders

## 2019-04-09 ENCOUNTER — Other Ambulatory Visit: Payer: Self-pay | Admitting: Internal Medicine

## 2019-04-09 ENCOUNTER — Other Ambulatory Visit: Payer: Self-pay

## 2019-04-09 DIAGNOSIS — I10 Essential (primary) hypertension: Secondary | ICD-10-CM

## 2019-04-09 DIAGNOSIS — D509 Iron deficiency anemia, unspecified: Secondary | ICD-10-CM | POA: Insufficient documentation

## 2019-04-09 LAB — IRON,TIBC AND FERRITIN PANEL
Ferritin: 9 ng/mL — ABNORMAL LOW (ref 15–150)
Iron Saturation: 9 % — CL (ref 15–55)
Iron: 43 ug/dL (ref 27–159)
Total Iron Binding Capacity: 458 ug/dL — ABNORMAL HIGH (ref 250–450)
UIBC: 415 ug/dL (ref 131–425)

## 2019-04-09 LAB — SPECIMEN STATUS REPORT

## 2019-04-09 MED ORDER — FERROUS SULFATE 325 (65 FE) MG PO TABS: 325.0000 mg | ORAL_TABLET | Freq: Every day | ORAL | 1 refills | Status: DC

## 2019-04-09 MED FILL — FERROUS SULFATE 325 MG TAB: 325 (65 FE) | 30 days supply | Qty: 30 | Fill #0

## 2019-04-09 NOTE — Progress Notes (Signed)
Error encounter. 

## 2019-04-09 NOTE — Progress Notes (Signed)
Let patient know that she has iron deficiency anemia.  She needs to start taking iron supplement daily.  I have sent the prescription to her pharmacy for her to pick up.

## 2019-05-21 ENCOUNTER — Ambulatory Visit: Payer: Self-pay | Admitting: Internal Medicine

## 2019-05-21 ENCOUNTER — Ambulatory Visit: Payer: Self-pay

## 2019-06-07 ENCOUNTER — Ambulatory Visit: Payer: Self-pay

## 2019-06-16 ENCOUNTER — Ambulatory Visit: Payer: Self-pay | Attending: Internal Medicine

## 2019-06-16 ENCOUNTER — Other Ambulatory Visit: Payer: Self-pay

## 2019-07-08 ENCOUNTER — Ambulatory Visit: Payer: Self-pay | Attending: Internal Medicine | Admitting: Family

## 2019-07-08 ENCOUNTER — Encounter: Payer: Self-pay | Admitting: Internal Medicine

## 2019-07-08 ENCOUNTER — Other Ambulatory Visit: Payer: Self-pay

## 2019-07-08 VITALS — BP 174/100 | HR 80 | Temp 98.6°F | Resp 16 | Wt 240.2 lb

## 2019-07-08 DIAGNOSIS — Z789 Other specified health status: Secondary | ICD-10-CM

## 2019-07-08 DIAGNOSIS — Z124 Encounter for screening for malignant neoplasm of cervix: Secondary | ICD-10-CM

## 2019-07-08 DIAGNOSIS — Z603 Acculturation difficulty: Secondary | ICD-10-CM

## 2019-07-08 DIAGNOSIS — I10 Essential (primary) hypertension: Secondary | ICD-10-CM

## 2019-07-08 DIAGNOSIS — B379 Candidiasis, unspecified: Secondary | ICD-10-CM

## 2019-07-08 LAB — RESULTS CONSOLE HPV: CHL HPV: NEGATIVE

## 2019-07-08 NOTE — Patient Instructions (Addendum)
PAP and STI testing today. Follow-upwith primary physician as needed.  Pruebas de PAP e ITS en la actualidad. Realice un seguimiento con el mdico de cabecera segn sea necesario.  Prueba de Papanicolaou Pap Test Por qu me debo realizar esta prueba? La prueba de Papanicolaou, tambin denominada citologa vaginal, es una prueba de cribado para Engineer, manufacturing signos de:  Cncer de la vagina, del cuello uterino y del tero. El cuello uterino es la parte baja del tero que se abre hacia la vagina.  Infeccin.  Cambios que podran ser un signo de que se est desarrollando un cncer (cambios precancerosos). Las mujeres deben realizarse esta prueba con regularidad. En general, debe hacerse una prueba de Papanicolaou cada 3 aos hasta alcanzar la menopausia o hasta los 65 aos. Las [employer] 30 y 60 aos de edad pueden elegir realizarse la prueba de Papanicolaou al mismo tiempo que la prueba del VPH (virus del papiloma humano) cada 5 aos (en lugar de cada 3 aos). El mdico puede recomendarle que se realice pruebas de Papanicolaou con ms o menos frecuencia en funcin de sus afecciones mdicas y los resultados de la prueba de Papanicolaou anterior. Qu tipo de [place] se toma?  El mdico recolectar una muestra de clulas de la superficie del cuello uterino. Lo har utilizando un pequeo hisopo de algodn, una esptula de plstico o un cepillo. Esta muestra se recolecta durante un examen plvico, mientras usted est recostada boca arriba sobre la mesa de examen con los pies en los descansos para pies (estribos). En algunos casos, tambin pueden recolectarse fluidos (secreciones) del cuello uterino y la vagina. Cmo debo prepararme para esta prueba?  Tenga en cuenta en qu etapa del ciclo menstrual se encuentra. Es posible que se le pida que vuelva a Charity fundraiser la prueba si est Magazine features editor en que debe Futures trader.  Si el da en que debe realizarse la prueba tiene una infeccin vaginal  aparente, deber volver a Nurse, learning disability prueba.  Siga las indicaciones del mdico acerca de lo siguiente: ? Cambiar o suspender los medicamentos que toma habitualmente. Algunos medicamentos pueden [employer] de la prueba, como los digitlicos y Regulatory affairs officer. ? Evite las duchas vaginales o los baos de inmersin el da de la prueba o Medical laboratory scientific officer anterior. Informe al mdico acerca de lo siguiente:  Cualquier alergia que tenga.  Todos los [employer], incluidos vitaminas, hierbas, gotas oftlmicas, cremas y [address] de [address].  Cualquier enfermedad de la sangre que tenga.  Cirugas a las que se someti.  Cualquier afeccin mdica que tenga.  Si est embarazada o podra estarlo. Cmo se informan los resultados? Los [employer] de la prueba se informarn como anormales o normales. Puede producirse un resultado positivo falso. Este tipo de resultado es incorrecto porque indica que una enfermedad est presente cuando en realidad no lo est. Puede producirse un resultado negativo falso. Este tipo de resultado es incorrecto porque indica que una enfermedad no est presente cuando en realidad lo est. Qu significan los Mondamin? Un resultado normal en la prueba significa que no tiene signos de cncer de la vagina, del cuello uterino o del tero. Un resultado anormal puede significar que tiene:  Cncer. Una prueba de Papanicolaou por s sola no es suficiente para Consulting civil engineer. En este caso, se le realizarn ms pruebas.  Cambios precancerosos en la vagina, cuello uterino o tero.  Inflamacin del cuello uterino.  Enfermedades de transmisin sexual (ETS).  Infecciones por hongos.  Infecciones por parsitos. Hable con  su mdico sobre lo que significan sus [place]. Preguntas para hacerle al mdico Consulte a su mdico o pregunte en el departamento donde se realiza la prueba acerca de lo siguiente:  Cundo estarn disponibles mis  resultados?  Cmo obtendr mis resultados?  Cules son mis opciones de tratamiento?  Qu otras pruebas necesito?  Cules son los prximos pasos que debo seguir? Resumen  En general, las mujeres deben hacerse una prueba de Papanicolaou cada 3 aos Teacher, English as a foreign language la menopausia o [facility] 7 aos de [place].  El mdico recolectar una muestra de clulas de la superficie del cuello uterino. Lo har utilizando un pequeo hisopo de algodn, una esptula de plstico o un cepillo.  En algunos casos, tambin pueden recolectarse fluidos (secreciones) del cuello uterino y la vagina. Esta informacin no tiene Marine scientist el consejo del mdico. Asegrese de hacerle al mdico cualquier pregunta que tenga. Document Revised: 12/31/2016 Document Reviewed: 12/31/2016 Elsevier Patient Education  2020 [employer].

## 2019-07-08 NOTE — Progress Notes (Signed)
Patient ID: Nicole Parker, female    DOB: 01-04-1981  MRN: [id number]  CC: Gynecologic Exam  Subjective: Nicole Parker is a 39 y.o. female with history of essential hypertension, type 2 diabetes mellitus without complication without long-term current use of insulin, and class 3 severe obesity due to excess calories with serious comorbidity and body mass index of 40.0 to 44.9 in adult who presents for PAP smear.  1. PAP SMEAR:  Age at menarche: 39 years old  LMP: 06/17/2019 Menses duration, flow, frequency: 4 days duration, heavy flow, one period monthly Pregnancies: 4 Births: 4 Terminated pregnancies: 2003 ectopic pregnancy miscarriage  Sexual concerns: denies Sexually active: yes, husband Contraception use past, present: condoms Previous STIs: denies Previous PID: denies Vaginal discharge: denies Vaginal lesions: denies  Pelvic pain: yes  Uterine fibroids: denies  Urinary incontinence: denies Bowel incontinence: denies  Previous GYN procedures: denies  Last PAP: 3 years ago  Results of last PAP: normal  Testing for STIs today? Yes  Testing for HIV today? No Colonoscopy: none  Mammogram: none  Patient Active Problem List   Diagnosis Date Noted  . Iron deficiency anemia 04/09/2019  . Type 2 diabetes mellitus without complication, without long-term current use of insulin (HCC) 04/07/2019  . Class 3 severe obesity due to excess calories with serious comorbidity and body mass index (BMI) of 40.0 to 44.9 in adult (HCC) 04/05/2019  . Essential hypertension 04/05/2019  . NVD (normal vaginal delivery) 11/03/2013  . Active labor 11/02/2013  . Preeclampsia 10/14/2013     Current Outpatient Medications on File Prior to Visit  Medication Sig Dispense Refill  . amLODipine (NORVASC) 5 MG tablet Take 1 tablet (5 mg total) by mouth daily. 30 tablet 3  . ferrous sulfate 325 (65 FE) MG tablet Take 1 tablet (325 mg total) by mouth daily with breakfast. 100 tablet 1  .  ibuprofen (ADVIL,MOTRIN) 200 MG tablet Take 800 mg by mouth every 6 (six) hours as needed for moderate pain.    . metFORMIN (GLUCOPHAGE) 500 MG tablet Take 1 tablet (500 mg total) by mouth daily with breakfast. 30 tablet 3   No current facility-administered medications on file prior to visit.    No Known Allergies  Social History   Socioeconomic History  . Marital status: Married    Spouse name: Not on file  . Number of children: 4  . Years of education: 9 th grade  . Highest education level: Not on file  Occupational History  . Occupation: unemployed  Tobacco Use  . Smoking status: Never Smoker  . Smokeless tobacco: Never Used  Substance and Sexual Activity  . Alcohol use: No  . Drug use: No  . Sexual activity: Yes    Birth control/protection: None  Other Topics Concern  . Not on file  Social History Narrative   She is able to read in spanish   Social Determinants of Health   Financial Resource Strain:   . Difficulty of Paying Living Expenses:   Food Insecurity:   . Worried About Programme researcher, broadcasting/film/video in the Last Year:   . Barista in the Last Year:   Transportation Needs:   . Freight forwarder (Medical):   Marland Kitchen Lack of Transportation (Non-Medical):   Physical Activity:   . Days of Exercise per Week:   . Minutes of Exercise per Session:   Stress:   . Feeling of Stress :   Social Connections:   . Frequency of Communication  with Friends and Family:   . Frequency of Social Gatherings with Friends and Family:   . Attends Religious Services:   . Active Member of Clubs or Organizations:   . Attends Archivist Meetings:   Marland Kitchen Marital Status:   Intimate Partner Violence:   . Fear of Current or Ex-Partner:   . Emotionally Abused:   Marland Kitchen Physically Abused:   . Sexually Abused:     No family history on file.  Past Surgical History:  Procedure Laterality Date  . NO PAST SURGERIES    . NO PAST SURGERIES      ROS: Review of Systems Negative except  as stated above  PHYSICAL EXAM: Vitals with BMI 07/08/2019 07/08/2019 04/05/2019  Height - - 5\' 4"   Weight - 240 lbs 3 oz 245 lbs 13 oz  BMI - - 32.99  Systolic 242 683 419  Diastolic 622 297 989  Pulse - 80 76  SpO2- 98%, room air Temperature- 98.67F, oral  Physical Exam General appearance - alert, well appearing, and in no distress, oriented to person, place, and time and overweight Neck - supple, no significant adenopathy Lymphatics - no palpable lymphadenopathy, no hepatosplenomegaly Chest - clear to auscultation, no wheezes, rales or rhonchi, symmetric air entry, no tachypnea, retractions or cyanosis Heart - normal rate, regular rhythm, normal S1, S2, no murmurs, rubs, clicks or gallops Breasts - patient declines to have breast exam Pelvic - VULVA: normal appearing vulva with no masses, tenderness or lesions, VAGINA: normal appearing vagina with normal color and discharge, flesh-colored skin tag outside vaginal vault at 5 o'clock, CERVIX:Nabothian cysts in a generalized pattern on cervix, UTERUS: uterus is normal size, shape, consistency and nontender, ADNEXA: normal adnexa in size, nontender and no masses, no palpable internal organs, exam chaperoned by Orlan Leavens, CMA  ASSESSMENT AND PLAN: 1. Pap smear for cervical cancer screening: -PAP today. -STI panel today. -HIV test last on 04/05/2019 with non-reactive result. -Follow-up with primary physician as needed. - Cytology - PAP - Cervicovaginal ancillary only  2. Essential hypertension:  -Blood pressure elevated today. Asymptomatic. Reports she has not taken prescribed medication today.   3. Language barrier: -[employer] participated during this call. Interpreter Name: Delorise Shiner, ID#: [id number]  Patient was given the opportunity to ask questions.  Patient verbalized understanding of the plan and was able to repeat key elements of the plan. Patient was given clear instructions to go to Emergency Department or return to medical  center if symptoms don't improve, worsen, or new problems develop.The patient verbalized understanding.  Camillia Herter, NP

## 2019-07-09 LAB — CYTOLOGY - PAP
Adequacy: ABSENT
Comment: NEGATIVE
Diagnosis: NEGATIVE
High risk HPV: NEGATIVE

## 2019-07-09 LAB — CERVICOVAGINAL ANCILLARY ONLY
Bacterial Vaginitis (gardnerella): NEGATIVE
Candida Glabrata: POSITIVE — AB
Candida Vaginitis: NEGATIVE
Chlamydia: NEGATIVE
Comment: NEGATIVE
Comment: NEGATIVE
Comment: NEGATIVE
Comment: NEGATIVE
Comment: NEGATIVE
Comment: NORMAL
Neisseria Gonorrhea: NEGATIVE
Trichomonas: NEGATIVE

## 2019-07-09 MED ORDER — FLUCONAZOLE 150 MG PO TABS: 150.0000 mg | ORAL_TABLET | Freq: Once | ORAL | 0 refills | Status: AC

## 2019-07-09 NOTE — Addendum Note (Signed)
Addended by: Rema Fendt on: 07/09/2019 02:16 PM   Modules accepted: Orders

## 2019-07-09 NOTE — Progress Notes (Signed)
Please call patient with update.   Patient has yeast infection. Diflucan 150 mg (1 tablet) for one dose has been prescribed and sent to pharmacy on file. Follow-up as needed.   PAP results are still pending.

## 2020-02-02 ENCOUNTER — Telehealth: Payer: Self-pay | Admitting: *Deleted

## 2020-02-02 ENCOUNTER — Ambulatory Visit: Payer: Self-pay

## 2020-02-02 ENCOUNTER — Other Ambulatory Visit (HOSPITAL_COMMUNITY)
Admission: RE | Admit: 2020-02-02 | Discharge: 2020-02-02 | Disposition: A | Discharge status: Home / Self Care | Payer: Self-pay | Source: Ambulatory Visit | Attending: Physician Assistant | Admitting: Physician Assistant

## 2020-02-02 DIAGNOSIS — N898 Other specified noninflammatory disorders of vagina: Secondary | ICD-10-CM | POA: Insufficient documentation

## 2020-02-02 NOTE — Telephone Encounter (Signed)
Using Spanish interpreter [place], # [phone]. Pt. Complaining of lower abdominal pain and vaginal discharge x 3 days ago. Menstrual period last week. No availability in the practice today. Will go to mobile unit.  Reason for Disposition  [1] MODERATE pain (e.g., interferes with normal activities) AND [2] pain comes and goes (cramps) AND [3] present > 24 hours  (Exception: pain with Vomiting or Diarrhea - see that Guideline)  Answer Assessment - Initial Assessment Questions 1. LOCATION: "Where does it hurt?"     Low 2. RADIATION: "Does the pain shoot anywhere else?" (e.g., chest, back)     No 3. ONSET: "When did the pain begin?" (e.g., minutes, hours or days ago)      3 days ago 4. SUDDEN: "Gradual or sudden onset?"     Gradual 5. PATTERN "Does the pain come and go, or is it constant?"    - If constant: "Is it getting better, staying the same, or worsening?"      (Note: Constant means the pain never goes away completely; most serious pain is constant and it progresses)     - If intermittent: "How long does it last?" "Do you have pain now?"     (Note: Intermittent means the pain goes away completely between bouts)     Comes and goes 6. SEVERITY: "How bad is the pain?"  (e.g., Scale 1-10; mild, moderate, or severe)   - MILD (1-3): doesn't interfere with normal activities, abdomen soft and not tender to touch    - MODERATE (4-7): interferes with normal activities or awakens from sleep, tender to touch    - SEVERE (8-10): excruciating pain, doubled over, unable to do any normal activities      6 7. RECURRENT SYMPTOM: "Have you ever had this type of stomach pain before?" If Yes, ask: "When was the last time?" and "What happened that time?"      Yes 8. CAUSE: "What do you think is causing the stomach pain?"     Unsure 9. RELIEVING/AGGRAVATING FACTORS: "What makes it better or worse?" (e.g., movement, antacids, bowel movement)     No 10. OTHER SYMPTOMS: "Has there been any vomiting, diarrhea,  constipation, or urine problems?"       Vaginal discharge -  11. PREGNANCY: "Is there any chance you are pregnant?" "When was your last menstrual period?"       No  Protocols used: ABDOMINAL PAIN - [facility]

## 2020-02-03 ENCOUNTER — Ambulatory Visit: Payer: Self-pay | Admitting: Physician Assistant

## 2020-02-03 ENCOUNTER — Other Ambulatory Visit: Payer: Self-pay

## 2020-02-03 VITALS — BP 177/116 | HR 86 | Temp 97.7°F | Resp 18 | Ht 63.0 in | Wt 231.0 lb

## 2020-02-03 DIAGNOSIS — I10 Essential (primary) hypertension: Secondary | ICD-10-CM

## 2020-02-03 DIAGNOSIS — N3000 Acute cystitis without hematuria: Secondary | ICD-10-CM | POA: Insufficient documentation

## 2020-02-03 LAB — POCT URINALYSIS DIP (CLINITEK)
Bilirubin, UA: NEGATIVE
Glucose, UA: NEGATIVE mg/dL
Ketones, POC UA: NEGATIVE mg/dL
Nitrite, UA: NEGATIVE
POC PROTEIN,UA: 30 — AB
Spec Grav, UA: 1.025 (ref 1.010–1.025)
Urobilinogen, UA: 0.2 E.U./dL
pH, UA: 6 (ref 5.0–8.0)

## 2020-02-03 MED ORDER — AMLODIPINE BESYLATE 5 MG PO TABS: 5.0000 mg | ORAL_TABLET | Freq: Every day | ORAL | 3 refills | Status: DC

## 2020-02-03 MED ORDER — NITROFURANTOIN MONOHYD MACRO 100 MG PO CAPS: 100.0000 mg | ORAL_CAPSULE | Freq: Two times a day (BID) | ORAL | 0 refills | Status: AC

## 2020-02-03 NOTE — Progress Notes (Signed)
Patient has eaten and and taken ibuprofen with no relief. Patient complains of abdominal and back pain being present for 4 days.

## 2020-02-03 NOTE — Patient Instructions (Addendum)
I sent an antibiotic to your pharmacy, you will take this twice a day for 5 days.  Make sure to drink lots of water and get plenty of rest.  Continue using the Metamucil to help with your constipation.  Please resume your blood pressure medication, please return to the mobile unit in 2 weeks for follow-up.  Roney Jaffe, PA-C Physician Assistant [facility] [email or web address]    Pielonefritis en los adultos Pyelonephritis, Adult La pielonefritis es una infeccin que se produce en el rin. Los riones son los rganos que filtran la sangre y [employer] residuos del torrente sanguneo a travs de la orina. La orina pasa desde los riones, a travs de tubos llamados urteres, hacia la vejiga. Hay dos tipos principales de pielonefritis:  Infecciones que se inician rpidamente sin sntomas previos (pielonefritis aguda).  Infecciones que persisten durante un perodo prolongado (pielonefritis crnica). En la [employer], la infeccin desaparece con el tratamiento y no causa otros problemas. Las infecciones ms graves o crnicas a veces pueden propagarse al torrente sanguneo u ocasionar otros problemas en los riones. Cules son las causas? Por lo general, entre las causas de esta afeccin, se incluyen las siguientes:  Bacterias que viajan desde la vejiga al rin. Esto puede ocurrir despus de haber tenido una infeccin en la vejiga (cistitis) o una infeccin urinaria (IU).  Infecciones de la vejiga causadas por bacterias que viajan desde el torrente sanguneo hasta el rin. Qu incrementa el riesgo? Es ms probable que esta afeccin se manifieste en:  Mujeres embarazadas.  Personas de edad avanzada.  Personas que tienen alguna de estas afecciones: ? Diabetes. ? Inflamacin de la prstata (prostatitis) en los hombres. ? Clculos renales o en la vejiga. ? Otras anormalidades del rin o de Research officer, political party. ? Cncer.  Las personas que tienen una sonda vesical.  Las personas que son sexualmente [place].  Las mujeres que usan espermicidas.  Las personas que tuvieron una IU previa. Cules son los signos o los sntomas? Los sntomas de esta afeccin incluyen:  Ganas frecuentes de Geographical information systems officer.  Necesidad intensa o persistente de Geographical information systems officer.  Sensacin de ardor o escozor al [employer].  Dolor abdominal.  Dolor de espalda.  Dolor al costado del cuerpo o en la fosa lumbar.  Fiebre o escalofros.  Sangre en la [place].  Nuseas o vmitos. Cmo se diagnostica? Esta afeccin se puede diagnosticar en funcin de lo siguiente:  Los antecedentes mdicos y un examen fsico.  Anlisis de [place].  Anlisis de [place]. Tambin pueden [employer] de diagnstico por imgenes de los riones, por [place], una ecografa o una exploracin por tomografa computarizada (TC). Cmo se trata? El tratamiento de esta afeccin puede depender de la gravedad de la infeccin.  Si la infeccin es leve y se detecta rpidamente, pueden administrarle antibiticos por boca (va oral). Deber tomar lquido para permanecer hidratado.  Si la infeccin es ms grave, es posible que deban hospitalizarlo para administrarle antibiticos directamente en una vena a travs de una va intravenosa (IV). Quizs tambin deban administrarle lquidos a travs de una va intravenosa si no puede permanecer bien hidratado. Despus de la hospitalizacin, es posible que deba tomar antibiticos durante un [place]. Podrn prescribirle otros tratamientos segn la causa de la infeccin. Siga estas instrucciones en su casa: Medicamentos  Tome su antibitico como se lo haya indicado el mdico. No deje de tomar el antibitico aunque comience a sentirse mejor.  [employer] medicamentos de venta libre y los  recetados solamente como se lo haya indicado el mdico. Instrucciones generales   Beba suficiente lquido como para  Pharmacologist la orina de color amarillo plido.  Evite la cafena, el t y las [address]. Estas sustancias irritan la vejiga.  Orine con frecuencia. Evite retener la orina durante largos perodos.  Orine antes y despus de las [employer].  Despus de las deposiciones, las mujeres deben higienizarse la regin perineal desde adelante hacia atrs. Use slo un papel tissue por vez.  Concurra a todas las visitas de [address] se lo haya indicado el mdico. Esto es importante. Comunquese con un mdico si:  Los sntomas no mejoran despus de 2das de tratamiento.  Sus sntomas empeoran.  Tiene fiebre. Solicite ayuda inmediatamente si:  No puede tomar los antibiticos ni ingerir lquidos.  Tiene escalofros.  Vomita.  Siente un dolor intenso en la espalda o en la fosa lumbar.  Se desmaya o siente una debilidad extrema. Resumen  La pielonefritis es una infeccin urinaria (IU) que se produce en el rin.  El tratamiento de esta afeccin puede depender de la gravedad de la infeccin.  Tome su antibitico como se lo haya indicado el mdico. No deje de tomar el antibitico aunque comience a sentirse mejor.  Beba suficiente lquido como para Pharmacologist la orina de color amarillo plido.  Concurra a todas las visitas de [address] se lo haya indicado el mdico. Esto es importante. Esta informacin no tiene Theme park manager el consejo del mdico. Asegrese de hacerle al mdico cualquier pregunta que tenga. Document Revised: 01/02/2018 Document Reviewed: 01/02/2018 Elsevier Patient Education  2020 [employer].

## 2020-02-03 NOTE — Progress Notes (Signed)
Established Patient Office Visit  Subjective:  Patient ID: Nicole Parker, female    DOB: [birth year]  Age: 39 y.o. MRN: [id number]  CC: No chief complaint on file.   HPI Nicole Parker reports that she has been having lower abdominal and lower back pain for the past 4 days. Has been taking ibuprofen without relief Endorses vaginal discharge, describes it clear and thick, denies odor Has been having spotting in between periods.  Denies dysuria, fever, chills  States that she has been constipated, for the past couple of years, states that it has been worse in the past couple of months.  States that she has a BM twice a day, sometimes she has to strain.Denies rectal  pain or bleeding States that she has been taking metamucil for years, drinks "a lot" of water   Has not been taking her BP meds in the past 4 months. Has been out of the medication due to not making her appt with her PCP, does not check BP at home, denies hypertensive sxs.   Due to language barrier, an interpreter was present during the history-taking and subsequent discussion (and for part of the physical exam) with this patient.   Past Medical History:  Diagnosis Date   Gestational diabetes    Hypertension    Pregnancy induced hypertension     Past Surgical History:  Procedure Laterality Date   NO PAST SURGERIES     NO PAST SURGERIES      History reviewed. No pertinent family history.  Social History   Socioeconomic History   Marital status: Married    Spouse name: Not on file   Number of children: 4   Years of education: 9 th grade   Highest education level: Not on file  Occupational History   Occupation: unemployed  Tobacco Use   Smoking status: Never Smoker   Smokeless tobacco: Never Used  Building services engineer Use: Never used  Substance and Sexual Activity   Alcohol use: No   Drug use: No   Sexual activity: Yes    Birth control/protection: None  Other Topics  Concern   Not on file  Social History Narrative   She is able to read in spanish   Social Determinants of Health   Financial Resource Strain: Not on file  Food Insecurity: Not on file  Transportation Needs: Not on file  Physical Activity: Not on file  Stress: Not on file  Social Connections: Not on file  Intimate Partner Violence: Not on file    Outpatient Medications Prior to Visit  Medication Sig Dispense Refill   ferrous sulfate 325 (65 FE) MG tablet Take 1 tablet (325 mg total) by mouth daily with breakfast. 100 tablet 1   ibuprofen (ADVIL,MOTRIN) 200 MG tablet Take 800 mg by mouth every 6 (six) hours as needed for moderate pain.     metFORMIN (GLUCOPHAGE) 500 MG tablet Take 1 tablet (500 mg total) by mouth daily with breakfast. 30 tablet 3   amLODipine (NORVASC) 5 MG tablet Take 1 tablet (5 mg total) by mouth daily. 30 tablet 3   No facility-administered medications prior to visit.    No Known Allergies  ROS Review of Systems  Constitutional: Negative for chills and fever.  HENT: Negative.   Eyes: Negative.   Respiratory: Negative.   Cardiovascular: Negative.   Gastrointestinal: Positive for constipation. Negative for rectal pain.  Endocrine: Negative.   Genitourinary: Positive for vaginal discharge. Negative for dysuria, genital sores, hematuria  and vaginal bleeding.  Musculoskeletal: Negative.   Skin: Negative.   Allergic/Immunologic: Negative.   Neurological: Negative.   Hematological: Negative.   Psychiatric/Behavioral: Negative.       Objective:    Physical Exam Vitals and nursing note reviewed.  Constitutional:      General: She is not in acute distress.    Appearance: Normal appearance. She is obese. She is not ill-appearing.  HENT:     Head: Normocephalic and atraumatic.     Right Ear: External ear normal.     Left Ear: External ear normal.     Nose: Nose normal.     Mouth/Throat:     Mouth: Mucous membranes are moist.     Pharynx:  Oropharynx is clear.  Eyes:     Extraocular Movements: Extraocular movements intact.     Conjunctiva/sclera: Conjunctivae normal.     Pupils: Pupils are equal, round, and reactive to light.  Cardiovascular:     Rate and Rhythm: Normal rate and regular rhythm.     Pulses: Normal pulses.     Heart sounds: Normal heart sounds.  Pulmonary:     Effort: Pulmonary effort is normal.     Breath sounds: Normal breath sounds.  Abdominal:     General: Abdomen is flat. Bowel sounds are normal.     Palpations: Abdomen is soft.     Tenderness: There is abdominal tenderness in the right lower quadrant and suprapubic area. There is right CVA tenderness and left CVA tenderness.  Musculoskeletal:        General: Normal range of motion.     Cervical back: Normal range of motion and neck supple.  Skin:    General: Skin is warm and dry.  Neurological:     General: No focal deficit present.     Mental Status: She is alert and oriented to person, place, and time.  Psychiatric:        Mood and Affect: Mood normal.        Behavior: Behavior normal.        Thought Content: Thought content normal.        Judgment: Judgment normal.     BP (!) 177/116 (BP Location: Left Arm, Patient Position: Sitting, Cuff Size: Large)    Pulse 86    Temp 97.7 F (36.5 C) (Oral)    Resp 18    Ht 5\' 3"  (1.6 m)    Wt 231 lb (104.8 kg)    LMP 01/28/2020    SpO2 100%    BMI 40.92 kg/m  Wt Readings from Last 3 Encounters:  02/03/20 231 lb (104.8 kg)  07/08/19 240 lb 3.2 oz (109 kg)  04/05/19 245 lb 12.8 oz (111.5 kg)      Health Maintenance Due  Topic Date Due   Hepatitis C Screening  Never done   PNEUMOCOCCAL POLYSACCHARIDE VACCINE AGE 45-64 HIGH RISK  Never done   COVID-19 Vaccine (1) Never done   FOOT EXAM  Never done   OPHTHALMOLOGY EXAM  Never done   URINE MICROALBUMIN  Never done   INFLUENZA VACCINE  Never done   HEMOGLOBIN A1C  10/06/2019    There are no preventive care reminders to display for this  patient.  No results found for: TSH Lab Results  Component Value Date   WBC 6.4 04/05/2019   HGB 11.6 04/05/2019   HCT 35.8 04/05/2019   MCV 71 (L) 04/05/2019   PLT 386 04/05/2019   Lab Results  Component Value Date  NA 138 04/05/2019   K 3.6 04/05/2019   CO2 21 04/05/2019   GLUCOSE 159 (H) 04/05/2019   BUN 11 04/05/2019   CREATININE 0.73 04/05/2019   BILITOT 0.4 04/05/2019   ALKPHOS 92 04/05/2019   AST 12 04/05/2019   ALT 12 04/05/2019   PROT 7.5 04/05/2019   ALBUMIN 4.1 04/05/2019   CALCIUM 9.3 04/05/2019   ANIONGAP 9 11/01/2016   Lab Results  Component Value Date   CHOL 136 04/05/2019   Lab Results  Component Value Date   HDL 32 (L) 04/05/2019   Lab Results  Component Value Date   LDLCALC 68 04/05/2019   Lab Results  Component Value Date   TRIG 218 (H) 04/05/2019   Lab Results  Component Value Date   CHOLHDL 4.3 04/05/2019   Lab Results  Component Value Date   HGBA1C 6.5 (H) 04/05/2019      Assessment & Plan:   Problem List Items Addressed This Visit      Cardiovascular and Mediastinum   Essential hypertension   Relevant Medications   amLODipine (NORVASC) 5 MG tablet     Genitourinary   Acute cystitis without hematuria - Primary   Relevant Medications   nitrofurantoin, macrocrystal-monohydrate, (MACROBID) 100 MG capsule   Other Relevant Orders   Urine culture   POCT URINALYSIS DIP (CLINITEK) (Completed)    1. Acute cystitis without hematuria  UA positive for leukocytes, trial Macrobid, urine sent for culture and vaginal swab completed.  Patient education given on increased hydration, rest  The patient was given clear instructions to go to ER or return to medical center if symptoms don't improve, worsen or new problems develop. The patient verbalized understanding. The patient was told to call to get lab results if they haven't heard anything in the next week.    - POCT URINALYSIS DIP (CLINITEK) - nitrofurantoin,  macrocrystal-monohydrate, (MACROBID) 100 MG capsule; Take 1 capsule (100 mg total) by mouth 2 (two) times daily for 5 days.  Dispense: 10 capsule; Refill: 0  2. Essential hypertension Patient encouraged to resume blood pressure medications, check blood pressure at home, keep a written log and bring log to next office visit.  Appointment was made for patient to resume care with primary care provider, will return to the mobile unit in 2 weeks for blood pressure check and follow-up. - amLODipine (NORVASC) 5 MG tablet; Take 1 tablet (5 mg total) by mouth daily.  Dispense: 30 tablet; Refill: 3   I have reviewed the patient's medical history (PMH, PSH, Social History, Family History, Medications, and allergies) , and have been updated if relevant. I spent 30 minutes reviewing chart and  face to face time with patient.     Meds ordered this encounter  Medications   nitrofurantoin, macrocrystal-monohydrate, (MACROBID) 100 MG capsule    Sig: Take 1 capsule (100 mg total) by mouth 2 (two) times daily for 5 days.    Dispense:  10 capsule    Refill:  0    Order Specific Question:   Supervising Provider    Answer:   Shan Levans E [[id number]]   amLODipine (NORVASC) 5 MG tablet    Sig: Take 1 tablet (5 mg total) by mouth daily.    Dispense:  30 tablet    Refill:  3    Order Specific Question:   Supervising Provider    Answer:   Storm Frisk [[id number]]    Follow-up: Return in 2 weeks (on 02/17/2020), or if symptoms worsen or fail  to improve.    Kasandra Knudsenari S Mayers, PA-C

## 2020-02-05 LAB — URINE CULTURE

## 2020-02-06 LAB — CERVICOVAGINAL ANCILLARY ONLY
Bacterial Vaginitis (gardnerella): NEGATIVE
Candida Glabrata: POSITIVE — AB
Candida Vaginitis: NEGATIVE
Chlamydia: NEGATIVE
Comment: NEGATIVE
Comment: NEGATIVE
Comment: NEGATIVE
Comment: NEGATIVE
Comment: NEGATIVE
Comment: NORMAL
Neisseria Gonorrhea: NEGATIVE
Trichomonas: NEGATIVE

## 2020-02-08 ENCOUNTER — Other Ambulatory Visit: Payer: Self-pay | Admitting: Physician Assistant

## 2020-02-08 DIAGNOSIS — B379 Candidiasis, unspecified: Secondary | ICD-10-CM

## 2020-02-08 MED ORDER — FLUCONAZOLE 150 MG PO TABS: 150.0000 mg | ORAL_TABLET | Freq: Once | ORAL | 0 refills | Status: AC

## 2020-02-08 NOTE — Telephone Encounter (Signed)
-----   Message from Roney Jaffe, [place] sent at 02/08/2020  9:28 AM EST ----- Please call patient and let her know that her swab was positive for a yeast infection, she needs to take diflucan once.  She should be tested again after treatment to make sure that it cleared due to it being a different form of candida.  The diflucan has a 50% success rate.

## 2020-02-08 NOTE — Telephone Encounter (Signed)
Medical Assistant used [employer] to contact patient.  Interpreter Name: Dorna Bloom #: [phone] Medical Assistant left message on patient's home and cell voicemail. Voicemail states to give a call back to [place] with [facility] at [phone]

## 2020-02-16 ENCOUNTER — Telehealth: Payer: Self-pay

## 2020-02-16 NOTE — Telephone Encounter (Signed)
Contacted patient with interpreter ID # [phone] to remind patient of appointment with [facility] and alert them of the change in location. LVM for them to call [facility]>

## 2020-02-17 ENCOUNTER — Ambulatory Visit: Payer: Self-pay

## 2020-03-21 ENCOUNTER — Ambulatory Visit: Payer: Self-pay | Admitting: Internal Medicine

## 2021-03-05 ENCOUNTER — Emergency Department (HOSPITAL_COMMUNITY)
Admission: EM | Admit: 2021-03-05 | Discharge: 2021-03-05 | Disposition: A | Discharge status: Home / Self Care | Payer: Self-pay | Attending: Emergency Medicine | Admitting: Emergency Medicine

## 2021-03-05 ENCOUNTER — Encounter (HOSPITAL_COMMUNITY): Payer: Self-pay

## 2021-03-05 ENCOUNTER — Other Ambulatory Visit: Payer: Self-pay

## 2021-03-05 DIAGNOSIS — N9489 Other specified conditions associated with female genital organs and menstrual cycle: Secondary | ICD-10-CM | POA: Insufficient documentation

## 2021-03-05 DIAGNOSIS — Z79899 Other long term (current) drug therapy: Secondary | ICD-10-CM | POA: Insufficient documentation

## 2021-03-05 DIAGNOSIS — I10 Essential (primary) hypertension: Secondary | ICD-10-CM | POA: Insufficient documentation

## 2021-03-05 DIAGNOSIS — M546 Pain in thoracic spine: Secondary | ICD-10-CM | POA: Insufficient documentation

## 2021-03-05 DIAGNOSIS — Z7984 Long term (current) use of oral hypoglycemic drugs: Secondary | ICD-10-CM | POA: Insufficient documentation

## 2021-03-05 DIAGNOSIS — M25531 Pain in right wrist: Secondary | ICD-10-CM | POA: Insufficient documentation

## 2021-03-05 LAB — COMPREHENSIVE METABOLIC PANEL
ALT: 20 U/L (ref 0–44)
AST: 20 U/L (ref 15–41)
Albumin: 3.1 g/dL — ABNORMAL LOW (ref 3.5–5.0)
Alkaline Phosphatase: 73 U/L (ref 38–126)
Anion gap: 10 (ref 5–15)
BUN: 10 mg/dL (ref 6–20)
CO2: 23 mmol/L (ref 22–32)
Calcium: 8.2 mg/dL — ABNORMAL LOW (ref 8.9–10.3)
Chloride: 102 mmol/L (ref 98–111)
Creatinine, Ser: 0.59 mg/dL (ref 0.44–1.00)
GFR, Estimated: >60 mL/min (ref >60)
Glucose, Bld: 199 mg/dL — ABNORMAL HIGH (ref 70–99)
Potassium: 3.5 mmol/L (ref 3.5–5.1)
Sodium: 135 mmol/L (ref 135–145)
Total Bilirubin: 0.3 mg/dL (ref 0.3–1.2)
Total Protein: 6.9 g/dL (ref 6.5–8.1)

## 2021-03-05 LAB — CBC
HCT: 35.7 % — ABNORMAL LOW (ref 36.0–46.0)
Hemoglobin: 10.9 g/dL — ABNORMAL LOW (ref 12.0–15.0)
MCH: 23.4 pg — ABNORMAL LOW (ref 26.0–34.0)
MCHC: 30.5 g/dL (ref 30.0–36.0)
MCV: 76.6 fL — ABNORMAL LOW (ref 80.0–100.0)
Platelets: 323 10*3/uL (ref 150–400)
RBC: 4.66 MIL/uL (ref 3.87–5.11)
RDW: 15.2 % (ref 11.5–15.5)
WBC: 8 10*3/uL (ref 4.0–10.5)
nRBC: 0 % (ref 0.0–0.2)

## 2021-03-05 LAB — URINALYSIS, ROUTINE W REFLEX MICROSCOPIC
Bilirubin Urine: NEGATIVE
Glucose, UA: 250 mg/dL — AB
Hgb urine dipstick: NEGATIVE
Ketones, ur: NEGATIVE mg/dL
Leukocytes,Ua: NEGATIVE
Nitrite: NEGATIVE
Protein, ur: NEGATIVE mg/dL
Specific Gravity, Urine: 1.015 (ref 1.005–1.030)
pH: 7 (ref 5.0–8.0)

## 2021-03-05 LAB — I-STAT BETA HCG BLOOD, ED (MC, WL, AP ONLY): I-stat hCG, quantitative: <5 m[IU]/mL (ref <5)

## 2021-03-05 MED ORDER — ACETAMINOPHEN 325 MG PO TABS
650.0000 mg | ORAL_TABLET | Freq: Four times a day (QID) | ORAL | Status: DC | PRN
  Administered 2021-03-05: 650 mg via ORAL
  Filled 2021-03-05: qty 2

## 2021-03-05 NOTE — ED Provider Notes (Addendum)
[facility] EMERGENCY DEPARTMENT Provider Note   CSN: [id number] Arrival date & time: 03/05/21  0321     History  Chief Complaint  Patient presents with   Back Pain    Nicole Parker is a 41 y.o. female.  HPI Patient is a 41 year old female who presents to the emergency department with right arm pain, right back pain, as well as tingling/numbness in the right hand.  Symptoms have been waxing waning for the past 2 weeks.  Denies any visual changes, headaches, chest pain, shortness of breath, nausea, vomiting, diarrhea, urinary complaints.  Patient states that she has been noncompliant with her blood pressure medications because that at times they will cause headaches.  History obtained via [employer] interpreter.    Home Medications Prior to Admission medications   Medication Sig Start Date End Date Taking? Authorizing Provider  amLODipine (NORVASC) 5 MG tablet Take 1 tablet (5 mg total) by mouth daily. 02/03/20   Mayers, Cari S, PA-C  ferrous sulfate 325 (65 FE) MG tablet Take 1 tablet (325 mg total) by mouth daily with breakfast. 04/09/19   Ladell Pier, MD  ibuprofen (ADVIL,MOTRIN) 200 MG tablet Take 800 mg by mouth every 6 (six) hours as needed for moderate pain.    [provider]  metFORMIN (GLUCOPHAGE) 500 MG tablet Take 1 tablet (500 mg total) by mouth daily with breakfast. 04/07/19   Ladell Pier, MD      Allergies    Patient has no known allergies.    Review of Systems   Review of Systems  All other systems reviewed and are negative. Ten systems reviewed and are negative for acute change, except as noted in the HPI.   Physical Exam Updated Vital Signs BP (!) 180/104    Pulse 81    Temp 98.7 F (37.1 C) (Oral)    Resp 16    Ht 5' (1.524 m)    Wt 107 kg    LMP 02/20/2021    SpO2 99%    BMI 46.09 kg/m  Physical Exam Vitals and nursing note reviewed.  Constitutional:      General: She is not in acute distress.    Appearance:  Normal appearance. She is not ill-appearing, toxic-appearing or diaphoretic.  HENT:     Head: Normocephalic and atraumatic.     Right Ear: External ear normal.     Left Ear: External ear normal.     Nose: Nose normal.     Mouth/Throat:     Mouth: Mucous membranes are moist.     Pharynx: Oropharynx is clear. No oropharyngeal exudate or posterior oropharyngeal erythema.  Eyes:     General: No scleral icterus.       Right eye: No discharge.        Left eye: No discharge.     Extraocular Movements: Extraocular movements intact.     Conjunctiva/sclera: Conjunctivae normal.  Cardiovascular:     Rate and Rhythm: Normal rate and regular rhythm.     Pulses: Normal pulses.     Heart sounds: Normal heart sounds. No murmur heard.   No friction rub. No gallop.  Pulmonary:     Effort: Pulmonary effort is normal. No respiratory distress.     Breath sounds: Normal breath sounds. No stridor. No wheezing, rhonchi or rales.  Abdominal:     General: Abdomen is flat.     Palpations: Abdomen is soft.     Tenderness: There is no abdominal tenderness.  Musculoskeletal:  General: Tenderness present. Normal range of motion.     Cervical back: Normal range of motion and neck supple. No tenderness.     Comments: Mild TTP noted along the right thoracic musculature.  No midline spine pain.  Mild TTP noted along the volar aspect of the right wrist.  Positive Tinel sign.  2+ radial pulses.  Skin:    General: Skin is warm and dry.  Neurological:     General: No focal deficit present.     Mental Status: She is alert and oriented to person, place, and time.  Psychiatric:        Mood and Affect: Mood normal.        Behavior: Behavior normal.   ED Results / Procedures / Treatments   Labs (all labs ordered are listed, but only abnormal results are displayed) Labs Reviewed  COMPREHENSIVE METABOLIC PANEL - Abnormal; Notable for the following components:      Result Value   Glucose, Bld 199 (*)     Calcium 8.2 (*)    Albumin 3.1 (*)    All other components within normal limits  CBC - Abnormal; Notable for the following components:   Hemoglobin 10.9 (*)    HCT 35.7 (*)    MCV 76.6 (*)    MCH 23.4 (*)    All other components within normal limits  URINALYSIS, ROUTINE W REFLEX MICROSCOPIC - Abnormal; Notable for the following components:   Glucose, UA 250 (*)    All other components within normal limits  I-STAT BETA HCG BLOOD, ED ([facility], [facility], [facility] ONLY)   EKG None  Radiology No results found.  Procedures Procedures    Medications Ordered in ED Medications  acetaminophen (TYLENOL) tablet 650 mg (has no administration in time range)   ED Course/ Medical Decision Making/ A&P                           Medical Decision Making Amount and/or Complexity of Data Reviewed Labs: ordered.  Risk OTC drugs.  Pt is a 41 y.o. female who presents to the emergency department with 2 weeks of right wrist pain, right-sided back pain, as well as tingling/numbness in the right hand.  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  She states that she does repetitive movements with the right hand.  Labs: CBC with a hemoglobin of 10.9, hematocrit of 35.7, MCV of 76.6, MCH of 23.4. CMP with a glucose of 199, calcium of 8.2, albumin of 3.1. UA with 250 glucose. I-STAT beta-hCG less than 5.  I, Rayna Sexton, PA-C, personally reviewed and evaluated these images and lab results as part of my medical decision-making.  Patient with pain in the right thoracic back as well as the right wrist.  Pain in the right thoracic back appears musculoskeletal in nature.  Given that it was in the region of the right flank basic labs as well as UA were obtained.  UA shows 250 glucose but otherwise does not appear infectious.  No hematuria.  CBC without leukocytosis.  Doubt pyelonephritis, acute cystitis, or kidney stone at this time.  Patient has palpable tenderness in the right  wrist.  Positive Tinel's sign.  She is right-hand dominant and works as a Scientist, product/process development and states that she does repetitive movements with the right hand.  Appears consistent with carpal tunnel.  Will place patient in a wrist splint.  She was given a referral to hand surgery  for further evaluation.  Patient also found to be hypertensive at this visit.  UA without proteinuria.  Normal kidney function on CMP.  Denies any headaches, visual changes, numbness, weakness, chest pain, shortness of breath.  No symptoms concerning for hypertensive encephalopathy at this time.  Does not appear to be consistent with hypertensive emergency.  Patient states that she intermittently takes 100 mg of an unknown medication for her blood pressure.  She states that at times will cause headaches so she is not routinely compliant with this.  She states she has not taken it for more than 3 days.  We discussed the risks associated with poorly managed high blood pressure.  Recommended that she follow-up with her PCP to have her blood pressure rechecked and to discuss her hypertension regimen further.  Feel the patient is stable for discharge at this time and she is agreeable.  We discussed return precautions.  Her questions were answered and she was amicable at the time of discharge.  Note: Portions of this report may have been transcribed using voice recognition software. Every effort was made to ensure accuracy; however, inadvertent computerized transcription errors may be present.   Final Clinical Impression(s) / ED Diagnoses Final diagnoses:  Right wrist pain  Acute right-sided thoracic back pain  Hypertension, unspecified type   Rx / DC Orders ED Discharge Orders     None         Rayna Sexton, PA-C 03/05/21 0452    Rayna Sexton, PA-C 03/05/21 0454    Quintella Reichert, MD 03/05/21 [phone]

## 2021-03-05 NOTE — Discharge Instructions (Addendum)
Like we discussed, please continue to wear your wrist brace for comfort.  I would recommend you continue taking Tylenol as needed for management of your pain.  Please follow the instructions on the bottle and do not exceed more than 3000 mg of Tylenol per day.  Below is the contact information for Dr. Frazier Butt.  He is a Actuary doctor.  Please follow-up with him regarding the pain in your right wrist as well as the numbness and tingling in your right hand.  Lastly, please follow-up with your regular doctor regarding your blood pressure.  If you find your current medications are causing side effects, you need to discuss this with your regular doctor and have your blood pressure medications adjusted.  If you develop any new or worsening symptoms please come back to the emergency department immediately.  ##################  [employer] contine usando su muequera para mayor comodidad. Le recomendara que contine tomando Tylenol segn sea necesario para Physicist, medical. Siga las instrucciones en la botella y no exceda ms de 3000 mg de Tylenol por da.  A continuacin se muestra la informacin de contacto del Dr. Frazier Butt. Es un mdico local de manos. Haga un seguimiento con l Theme park manager en la [place], as como el entumecimiento y el hormigueo en la mano derecha.  Por ltimo, haga un seguimiento con su mdico habitual con respecto a su presin arterial. Si encuentra que sus medicamentos actuales estn causando efectos secundarios, debe hablar sobre esto con su mdico habitual y ajustar sus medicamentos para la presin arterial.  Si desarrolla sntomas nuevos o que empeoran, regrese al departamento de emergencias de inmediato.

## 2021-03-05 NOTE — ED Notes (Signed)
Patient verbalizes understanding of d/c instructions. Wrist immobilizer applied. Opportunities for questions and answers were provided. Pt d/c from ED and ambulated to lobby.

## 2021-03-05 NOTE — ED Triage Notes (Signed)
Pt presents to the ED from home with complaints of pain in right arm, numbness to hand onset 2 weeks ago. Right flank pain onset 2 weeks, denies urinary symptoms.

## 2021-03-20 ENCOUNTER — Encounter: Payer: Self-pay | Admitting: Orthopedic Surgery

## 2021-03-20 ENCOUNTER — Ambulatory Visit (INDEPENDENT_AMBULATORY_CARE_PROVIDER_SITE_OTHER): Payer: Self-pay

## 2021-03-20 ENCOUNTER — Other Ambulatory Visit: Payer: Self-pay

## 2021-03-20 ENCOUNTER — Ambulatory Visit (INDEPENDENT_AMBULATORY_CARE_PROVIDER_SITE_OTHER): Payer: Self-pay | Admitting: Orthopedic Surgery

## 2021-03-20 VITALS — BP 138/80 | HR 85 | Ht 64.0 in | Wt 249.4 lb

## 2021-03-20 DIAGNOSIS — M25531 Pain in right wrist: Secondary | ICD-10-CM

## 2021-03-20 DIAGNOSIS — R202 Paresthesia of skin: Secondary | ICD-10-CM

## 2021-03-20 DIAGNOSIS — R200 Anesthesia of skin: Secondary | ICD-10-CM | POA: Insufficient documentation

## 2021-03-20 NOTE — Progress Notes (Signed)
Office Visit Note   Patient: Nicole Parker           Date of Birth: March 29, 1980           MRN: [id number] Visit Date: 03/20/2021              Requested by: Ladell Pier, MD [address] [place],  [place] [address] PCP: Ladell Pier, MD   Assessment & Plan: Visit Diagnoses:  1. Pain in right wrist     Plan: Discussed with patient that her symptoms sound like mild carpal tunnel syndrome without provocative signs.  She has nocturnal symptoms several nights per week.  We will try wearing a wrist brace at night.  If her symptoms fail to improve, then will discuss getting electrodiagnostic studies.  Follow-Up Instructions: No follow-ups on file.   Orders:  Orders Placed This Encounter  Procedures   XR Wrist Complete Right   No orders of the defined types were placed in this encounter.     Procedures: No procedures performed   Clinical Data: No additional findings.   Subjective: Chief Complaint  Patient presents with   Right Wrist - New Patient (Initial Visit)    This is a 41 yo RHD F who presents with numbness and tingling of her thumb, index, and middle fingers.  This has been going on for at least a month.  She denies any injury.  She wakes up 2-3 nights per week with symptoms.  She has no numbness or tingling today.  She has never had any treatment.  She has never had any electrodiagnostic studies.    Review of Systems   Objective: Vital Signs: BP 138/80 (BP Location: Left Arm, Patient Position: Sitting, Cuff Size: Large)    Pulse 85    Ht 5\' 4"  (1.626 m)    Wt 249 lb 6.4 oz (113.1 kg)    LMP 02/20/2021    SpO2 96%    BMI 42.81 kg/m   Physical Exam Constitutional:      Appearance: Normal appearance.  Cardiovascular:     Rate and Rhythm: Normal rate.     Pulses: Normal pulses.  Pulmonary:     Effort: Pulmonary effort is normal.  Skin:    General: Skin is warm and dry.     Capillary Refill: Capillary refill takes less than 2 seconds.   Neurological:     Mental Status: She is alert.    Right Hand Exam   Tenderness  The patient is experiencing no tenderness.   Range of Motion  The patient has normal right wrist ROM.   Tests  Phalens Sign: negative Tinel's sign (median nerve): negative  Other  Erythema: absent Sensation: normal Pulse: present     Specialty Comments:  No specialty comments available.  Imaging: No results found.   PMFS History: Patient Active Problem List   Diagnosis Date Noted   Acute cystitis without hematuria 02/03/2020   Iron deficiency anemia 04/09/2019   Type 2 diabetes mellitus without complication, without long-term current use of insulin ([place]) 04/07/2019   Class 3 severe obesity due to excess calories with serious comorbidity and body mass index (BMI) of 40.0 to 44.9 in adult [facility]) 04/05/2019   Essential hypertension 04/05/2019   NVD (normal vaginal delivery) 11/03/2013   Active labor 11/02/2013   Preeclampsia 10/14/2013   Past Medical History:  Diagnosis Date   Gestational diabetes    Hypertension    Pregnancy induced hypertension     History reviewed.  No pertinent family history.  Past Surgical History:  Procedure Laterality Date   NO PAST SURGERIES     NO PAST SURGERIES     Social History   Occupational History   Occupation: unemployed  Tobacco Use   Smoking status: Never   Smokeless tobacco: Never  Vaping Use   Vaping Use: Never used  Substance and Sexual Activity   Alcohol use: No   Drug use: No   Sexual activity: Yes    Birth control/protection: None

## 2021-07-03 ENCOUNTER — Other Ambulatory Visit: Payer: Self-pay

## 2021-07-03 ENCOUNTER — Emergency Department (HOSPITAL_BASED_OUTPATIENT_CLINIC_OR_DEPARTMENT_OTHER): Payer: Self-pay

## 2021-07-03 ENCOUNTER — Emergency Department (HOSPITAL_BASED_OUTPATIENT_CLINIC_OR_DEPARTMENT_OTHER): Payer: Self-pay | Admitting: Radiology

## 2021-07-03 ENCOUNTER — Inpatient Hospital Stay: Payer: Self-pay | Admitting: Critical Care Medicine

## 2021-07-03 ENCOUNTER — Inpatient Hospital Stay (HOSPITAL_BASED_OUTPATIENT_CLINIC_OR_DEPARTMENT_OTHER)
Admission: EM | Admit: 2021-07-03 | Discharge: 2021-07-06 | DRG: 304 | Disposition: A | Discharge status: Home / Self Care | Payer: Self-pay | Attending: Family Medicine | Admitting: Family Medicine

## 2021-07-03 ENCOUNTER — Encounter (HOSPITAL_BASED_OUTPATIENT_CLINIC_OR_DEPARTMENT_OTHER): Payer: Self-pay

## 2021-07-03 DIAGNOSIS — I5043 Acute on chronic combined systolic (congestive) and diastolic (congestive) heart failure: Secondary | ICD-10-CM | POA: Diagnosis not present

## 2021-07-03 DIAGNOSIS — E119 Type 2 diabetes mellitus without complications: Secondary | ICD-10-CM | POA: Diagnosis present

## 2021-07-03 DIAGNOSIS — E1165 Type 2 diabetes mellitus with hyperglycemia: Secondary | ICD-10-CM

## 2021-07-03 DIAGNOSIS — K760 Fatty (change of) liver, not elsewhere classified: Secondary | ICD-10-CM | POA: Diagnosis present

## 2021-07-03 DIAGNOSIS — R7989 Other specified abnormal findings of blood chemistry: Secondary | ICD-10-CM | POA: Insufficient documentation

## 2021-07-03 DIAGNOSIS — I110 Hypertensive heart disease with heart failure: Secondary | ICD-10-CM | POA: Diagnosis present

## 2021-07-03 DIAGNOSIS — I161 Hypertensive emergency: Principal | ICD-10-CM | POA: Diagnosis present

## 2021-07-03 DIAGNOSIS — I509 Heart failure, unspecified: Secondary | ICD-10-CM

## 2021-07-03 DIAGNOSIS — E876 Hypokalemia: Secondary | ICD-10-CM

## 2021-07-03 DIAGNOSIS — I169 Hypertensive crisis, unspecified: Principal | ICD-10-CM

## 2021-07-03 DIAGNOSIS — Z79899 Other long term (current) drug therapy: Secondary | ICD-10-CM

## 2021-07-03 DIAGNOSIS — R29818 Other symptoms and signs involving the nervous system: Secondary | ICD-10-CM

## 2021-07-03 DIAGNOSIS — I5042 Chronic combined systolic (congestive) and diastolic (congestive) heart failure: Secondary | ICD-10-CM

## 2021-07-03 DIAGNOSIS — Z6839 Body mass index (BMI) 39.0-39.9, adult: Secondary | ICD-10-CM

## 2021-07-03 DIAGNOSIS — I712 Thoracic aortic aneurysm, without rupture, unspecified: Secondary | ICD-10-CM | POA: Diagnosis present

## 2021-07-03 DIAGNOSIS — I4729 Other ventricular tachycardia: Secondary | ICD-10-CM

## 2021-07-03 DIAGNOSIS — I472 Ventricular tachycardia, unspecified: Secondary | ICD-10-CM | POA: Diagnosis present

## 2021-07-03 DIAGNOSIS — E871 Hypo-osmolality and hyponatremia: Secondary | ICD-10-CM | POA: Diagnosis present

## 2021-07-03 LAB — CBC
HCT: 40.2 % (ref 36.0–46.0)
Hemoglobin: 13.5 g/dL (ref 12.0–15.0)
MCH: 27.4 pg (ref 26.0–34.0)
MCHC: 33.6 g/dL (ref 30.0–36.0)
MCV: 81.5 fL (ref 80.0–100.0)
Platelets: 283 10*3/uL (ref 150–400)
RBC: 4.93 MIL/uL (ref 3.87–5.11)
RDW: 13 % (ref 11.5–15.5)
WBC: 8.2 10*3/uL (ref 4.0–10.5)
nRBC: 0 % (ref 0.0–0.2)

## 2021-07-03 LAB — BASIC METABOLIC PANEL
Anion gap: 13 (ref 5–15)
BUN: 15 mg/dL (ref 6–20)
CO2: 23 mmol/L (ref 22–32)
Calcium: 8.5 mg/dL — ABNORMAL LOW (ref 8.9–10.3)
Chloride: 99 mmol/L (ref 98–111)
Creatinine, Ser: 0.54 mg/dL (ref 0.44–1.00)
GFR, Estimated: >60 mL/min (ref >60)
Glucose, Bld: 203 mg/dL — ABNORMAL HIGH (ref 70–99)
Potassium: 3.4 mmol/L — ABNORMAL LOW (ref 3.5–5.1)
Sodium: 135 mmol/L (ref 135–145)

## 2021-07-03 LAB — MRSA NEXT GEN BY PCR, NASAL: MRSA by PCR Next Gen: NOT DETECTED

## 2021-07-03 LAB — HCG, QUANTITATIVE, PREGNANCY: hCG, Beta Chain, Quant, S: <1 m[IU]/mL (ref <5)

## 2021-07-03 LAB — TROPONIN I (HIGH SENSITIVITY)
Troponin I (High Sensitivity): 8 ng/L (ref <18)
Troponin I (High Sensitivity): 6 ng/L (ref <18)

## 2021-07-03 LAB — MAGNESIUM: Magnesium: 1.6 mg/dL — ABNORMAL LOW (ref 1.7–2.4)

## 2021-07-03 LAB — HEPATIC FUNCTION PANEL
ALT: 26 U/L (ref 0–44)
AST: 24 U/L (ref 15–41)
Albumin: 4 g/dL (ref 3.5–5.0)
Alkaline Phosphatase: 67 U/L (ref 38–126)
Bilirubin, Direct: 0.1 mg/dL (ref 0.0–0.2)
Indirect Bilirubin: 0.4 mg/dL (ref 0.3–0.9)
Total Bilirubin: 0.5 mg/dL (ref 0.3–1.2)
Total Protein: 7.5 g/dL (ref 6.5–8.1)

## 2021-07-03 LAB — GLUCOSE, CAPILLARY: Glucose-Capillary: 180 mg/dL — ABNORMAL HIGH (ref 70–99)

## 2021-07-03 LAB — BRAIN NATRIURETIC PEPTIDE: B Natriuretic Peptide: 105.8 pg/mL — ABNORMAL HIGH (ref 0.0–100.0)

## 2021-07-03 LAB — TSH: TSH: 2.005 u[IU]/mL (ref 0.350–4.500)

## 2021-07-03 LAB — HIV ANTIBODY (ROUTINE TESTING W REFLEX): HIV Screen 4th Generation wRfx: NONREACTIVE

## 2021-07-03 LAB — D-DIMER, QUANTITATIVE: D-Dimer, Quant: 0.64 ug/mL-FEU — ABNORMAL HIGH (ref 0.00–0.50)

## 2021-07-03 MED ORDER — SODIUM CHLORIDE 0.9 % IV SOLN: 250.0000 mL | INTRAVENOUS | Status: DC | PRN

## 2021-07-03 MED ORDER — FUROSEMIDE 10 MG/ML IJ SOLN
20.0000 mg | Freq: Once | INTRAMUSCULAR | Status: AC
  Administered 2021-07-03: 20 mg via INTRAVENOUS
  Filled 2021-07-03: qty 2

## 2021-07-03 MED ORDER — POTASSIUM CHLORIDE CRYS ER 20 MEQ PO TBCR
20.0000 meq | EXTENDED_RELEASE_TABLET | Freq: Once | ORAL | Status: AC
Start: 2021-07-03 — End: 2021-07-03
  Administered 2021-07-03: 20 meq via ORAL
  Filled 2021-07-03: qty 1

## 2021-07-03 MED ORDER — SODIUM CHLORIDE 0.9% FLUSH
3.0000 mL | Freq: Two times a day (BID) | INTRAVENOUS | Status: DC
  Administered 2021-07-03 – 2021-07-05 (×4): 3 mL via INTRAVENOUS

## 2021-07-03 MED ORDER — ENOXAPARIN SODIUM 40 MG/0.4ML IJ SOSY
40.0000 mg | PREFILLED_SYRINGE | INTRAMUSCULAR | Status: DC
Start: 2021-07-03 — End: 2021-07-06
  Administered 2021-07-03 – 2021-07-05 (×3): 40 mg via SUBCUTANEOUS
  Filled 2021-07-03 (×3): qty 0.4

## 2021-07-03 MED ORDER — HYDRALAZINE HCL 20 MG/ML IJ SOLN
10.0000 mg | Freq: Once | INTRAMUSCULAR | Status: DC
  Filled 2021-07-03: qty 1

## 2021-07-03 MED ORDER — MAGNESIUM SULFATE 2 GM/50ML IV SOLN
2.0000 g | Freq: Once | INTRAVENOUS | Status: AC
Start: 2021-07-03 — End: 2021-07-03
  Administered 2021-07-03: 2 g via INTRAVENOUS
  Filled 2021-07-03: qty 50

## 2021-07-03 MED ORDER — ACETAMINOPHEN 650 MG RE SUPP: 650.0000 mg | Freq: Four times a day (QID) | RECTAL | Status: DC | PRN

## 2021-07-03 MED ORDER — METOPROLOL TARTRATE 5 MG/5ML IV SOLN
5.0000 mg | Freq: Once | INTRAVENOUS | Status: AC
  Administered 2021-07-03: 5 mg via INTRAVENOUS
  Filled 2021-07-03: qty 5

## 2021-07-03 MED ORDER — IOHEXOL 350 MG/ML SOLN
100.0000 mL | Freq: Once | INTRAVENOUS | Status: AC | PRN
  Administered 2021-07-03: 100 mL via INTRAVENOUS

## 2021-07-03 MED ORDER — ACETAMINOPHEN 325 MG PO TABS
650.0000 mg | ORAL_TABLET | Freq: Four times a day (QID) | ORAL | Status: DC | PRN
  Administered 2021-07-03: 650 mg via ORAL
  Filled 2021-07-03 (×2): qty 2

## 2021-07-03 MED ORDER — CARVEDILOL 3.125 MG PO TABS
3.1250 mg | ORAL_TABLET | Freq: Two times a day (BID) | ORAL | Status: DC
  Administered 2021-07-03 – 2021-07-05 (×4): 3.125 mg via ORAL
  Filled 2021-07-03 (×4): qty 1

## 2021-07-03 MED ORDER — SODIUM CHLORIDE 0.9% FLUSH: 3.0000 mL | INTRAVENOUS | Status: DC | PRN

## 2021-07-03 MED ORDER — INSULIN ASPART 100 UNIT/ML IJ SOLN
0.0000 [IU] | Freq: Three times a day (TID) | INTRAMUSCULAR | Status: DC
  Administered 2021-07-04: 2 [IU] via SUBCUTANEOUS
  Administered 2021-07-04: 3 [IU] via SUBCUTANEOUS
  Administered 2021-07-04: 8 [IU] via SUBCUTANEOUS
  Administered 2021-07-05: 3 [IU] via SUBCUTANEOUS
  Administered 2021-07-05: 2 [IU] via SUBCUTANEOUS
  Administered 2021-07-05: 3 [IU] via SUBCUTANEOUS

## 2021-07-03 MED ORDER — NITROGLYCERIN 2 % TD OINT
1.0000 [in_us] | TOPICAL_OINTMENT | Freq: Once | TRANSDERMAL | Status: AC
  Administered 2021-07-03: 1 [in_us] via TOPICAL
  Filled 2021-07-03: qty 1

## 2021-07-03 MED ORDER — ACETAMINOPHEN 500 MG PO TABS
1000.0000 mg | ORAL_TABLET | Freq: Once | ORAL | Status: AC
  Administered 2021-07-03: 1000 mg via ORAL
  Filled 2021-07-03: qty 2

## 2021-07-03 MED ORDER — POTASSIUM CHLORIDE 10 MEQ/100ML IV SOLN
10.0000 meq | Freq: Once | INTRAVENOUS | Status: AC
  Administered 2021-07-03: 10 meq via INTRAVENOUS
  Filled 2021-07-03: qty 100

## 2021-07-03 MED ORDER — HYDRALAZINE HCL 20 MG/ML IJ SOLN
10.0000 mg | Freq: Four times a day (QID) | INTRAMUSCULAR | Status: DC | PRN
Start: 2021-07-03 — End: 2021-07-06

## 2021-07-03 NOTE — Progress Notes (Signed)
Plan of Care Note for accepted transfer   Patient: Nicole Parker MRN: [id number]   DOA: 07/03/2021  [facility] transfer: [facility] Requesting Provider: Particia Nearing Reason for transfer: HTN crisis with CHF  Facility course: Patient with h/o HTN and DM (not taking medications) presenting with CP, SOB.  BP 218/132.  CXR with ?CHF.  +D-dimer, no PE but CHF noted.  Had a run of vtach, felt dizzy.  Dr. Cristal Deer recommends K+ and Mag++ repletion, give beta blocker, admit to medicine and cardiology will consult.  Needs Echo.   Plan of care: The patient is accepted for admission to Progressive unit, at [facility].    Author: Jonah Blue, MD 07/03/2021  Check [email or web address] for on-call coverage.  Nursing staff, Please call [facility] Admits & Consults System-Wide number on Amion as soon as patient's arrival, so appropriate admitting provider can evaluate the pt.

## 2021-07-03 NOTE — Assessment & Plan Note (Signed)
a1c in 2021 was 6.5 Update A1C SSI and accuchecks QAC/HS

## 2021-07-03 NOTE — ED Notes (Addendum)
Per Particia Nearing MD patient had a non sustained episode of v tach. Patient in NSR at this time with occasional PVC's. Patient reports palpitations and dizziness during episode. Denies symptoms at this time. Verbal order to hold Hydralazine at this time.

## 2021-07-03 NOTE — ED Provider Notes (Signed)
Pt signed out by Dr. Leonette Monarch.  BMP with K 3.4 and Glucose 203 Cbc nl HFP nl Bnp 105.8 Ddimer elevated at 0.64  CT chest:    IMPRESSION:  1. No evidence of pulmonary embolism.  2. Cardiomegaly with evidence of interstitial pulmonary edema in the  lungs and small bilateral pleural effusions; imaging findings once  again concerning for probable congestive heart failure.  3. Ectasia of ascending thoracic aorta (4.1 cm in diameter).  Recommend annual imaging followup by CTA or MRA. This recommendation  follows 2010 ACCF/AHA/AATS/ACR/ASA/SCA/SCAI/SIR/STS/SVM Guidelines  for the Diagnosis and Management of Patients with Thoracic Aortic  Disease. Circulation. 2010; [id number]. Aortic aneurysm NOS  (ICD10-I71.9).  4. Hepatic steatosis.   I reviewed images and agree with the radiologist.  Pt given 20 mg lasix and 5 mg lopressor iv. BP is improved.  Pt c/o h/a, so nitro taken off pt.  While I was talking with pt, she had several pvcs on the monitor, then went into vtach.     Pt d/w Dr. Harrell Gave (cards).  She recommends holding amiodarone.  She recommends giving kcl and checking a magnesium.  Mg 1.6, so pt given 2 g Mg.  She recommends admission to medicine and they will see in consult.  Pt d/w Dr. Lorin Mercy (triad) who will admit.  I CRITICAL CARE Performed by: Isla Pence   Total critical care time: 30 minutes  Critical care time was exclusive of separately billable procedures and treating other patients.  Critical care was necessary to treat or prevent imminent or life-threatening deterioration.  Critical care was time spent personally by me on the following activities: development of treatment plan with patient and/or surrogate as well as nursing, discussions with consultants, evaluation of patient's response to treatment, examination of patient, obtaining history from patient or surrogate, ordering and performing treatments and interventions, ordering and review of  laboratory studies, ordering and review of radiographic studies, pulse oximetry and re-evaluation of patient's condition.    Isla Pence, MD 07/03/21 1048

## 2021-07-03 NOTE — ED Notes (Signed)
Nitro paste removed from left chest per verbal order from [facility] MD.

## 2021-07-03 NOTE — Plan of Care (Signed)
  Problem: Nutritional: Goal: Maintenance of adequate nutrition will improve Outcome: Progressing   Problem: Health Behavior/Discharge Planning: Goal: Ability to manage health-related needs will improve Outcome: Progressing   Problem: Activity: Goal: Risk for activity intolerance will decrease Outcome: Progressing   Problem: Nutrition: Goal: Adequate nutrition will be maintained Outcome: Progressing   Problem: Elimination: Goal: Will not experience complications related to bowel motility Outcome: Progressing Goal: Will not experience complications related to urinary retention Outcome: Progressing   Problem: Safety: Goal: Ability to remain free from injury will improve Outcome: Progressing   Problem: Skin Integrity: Goal: Risk for impaired skin integrity will decrease Outcome: Progressing   Problem: Education: Goal: Ability to demonstrate management of disease process will improve Outcome: Not Applicable Goal: Ability to verbalize understanding of medication therapies will improve Outcome: Not Applicable Goal: Individualized Educational Video(s) Outcome: Not Applicable   Problem: Activity: Goal: Capacity to carry out activities will improve Outcome: Not Applicable   Problem: Cardiac: Goal: Ability to achieve and maintain adequate cardiopulmonary perfusion will improve Outcome: Not Applicable

## 2021-07-03 NOTE — Assessment & Plan Note (Addendum)
Potassium 3.4. given in ED, will give more Magnesium 1.6-repleted  Trend

## 2021-07-03 NOTE — Assessment & Plan Note (Signed)
There was some initial concern for Cushingoid features.  Overnight dexamethasone suppression was normal, I think Cushings fairly unlikely.  Not unreasonable to check salivary cortisol after d/c.

## 2021-07-03 NOTE — Assessment & Plan Note (Addendum)
Had a run of vtach in ED and she was dizzy Optimize electrolytes: magnesium and potassium both low EDP discussed case with on call cardiology who reccommended repleteing electrolytes Consult if continues, no more events thus far  Starting beta blocker for HTN

## 2021-07-03 NOTE — Progress Notes (Deleted)
toc

## 2021-07-03 NOTE — ED Provider Notes (Signed)
[facility] EMERGENCY DEPT Provider Note  CSN: [id number] Arrival date & time: 07/03/21 6195  Chief Complaint(s) Chest Pain  HPI Nicole Parker is a 41 y.o. female with a past medical history listed below including hypertension, diabetes, who presents to the emergency department with sudden onset substernal chest pressure radiating to her mid back associated with severe shortness of breath that improved with being upright.  She is endorsing orthopnea.  Patient attempted to take Goody powders but provided no relief.  She denies recent fevers or infections.  No cough or congestion.  She endorses nausea without emesis.  No abdominal pain.  Denies any cardiac history.  No history of heart failure.  No history of DVT/PEs.  Patient reports that she has not been compliant with her blood pressure medication because it makes her right arm hurt after taking her medication.  The history is provided by the patient.   Past Medical History Past Medical History:  Diagnosis Date   Gestational diabetes    Hypertension    Pregnancy induced hypertension    Patient Active Problem List   Diagnosis Date Noted   Numbness and tingling in right hand 03/20/2021   Acute cystitis without hematuria 02/03/2020   Iron deficiency anemia 04/09/2019   Type 2 diabetes mellitus without complication, without long-term current use of insulin (HCC) 04/07/2019   Class 3 severe obesity due to excess calories with serious comorbidity and body mass index (BMI) of 40.0 to 44.9 in adult [facility]) 04/05/2019   Essential hypertension 04/05/2019   NVD (normal vaginal delivery) 11/03/2013   Active labor 11/02/2013   Preeclampsia 10/14/2013   Home Medication(s) Prior to Admission medications   Medication Sig Start Date End Date Taking? Authorizing Provider  amLODipine (NORVASC) 5 MG tablet Take 1 tablet (5 mg total) by mouth daily. 02/03/20   Mayers, Cari S, PA-C  ferrous sulfate 325 (65 FE) MG tablet Take 1  tablet (325 mg total) by mouth daily with breakfast. 04/09/19   Marcine Matar, MD  ibuprofen (ADVIL,MOTRIN) 200 MG tablet Take 800 mg by mouth every 6 (six) hours as needed for moderate pain.    [provider]  metFORMIN (GLUCOPHAGE) 500 MG tablet Take 1 tablet (500 mg total) by mouth daily with breakfast. 04/07/19   Marcine Matar, MD                                                                                                                                    Allergies Patient has no known allergies.  Review of Systems Review of Systems As noted in HPI  Physical Exam Vital Signs  I have reviewed the triage vital signs BP (!) 173/101   Pulse 79   Temp 98.6 F (37 C) (Oral)   Resp 18   Ht 5\' 5"  (1.651 m)   Wt 108.9 kg   SpO2 95%   BMI 39.94 kg/m   Physical Exam  Vitals reviewed.  Constitutional:      General: She is not in acute distress.    Appearance: She is well-developed and overweight. She is not diaphoretic.  HENT:     Head: Normocephalic and atraumatic.     Nose: Nose normal.  Eyes:     General: No scleral icterus.       Right eye: No discharge.        Left eye: No discharge.     Conjunctiva/sclera: Conjunctivae normal.     Pupils: Pupils are equal, round, and reactive to light.  Cardiovascular:     Rate and Rhythm: Normal rate and regular rhythm.     Heart sounds: No murmur heard.   No friction rub. No gallop.  Pulmonary:     Effort: Pulmonary effort is normal. No respiratory distress.     Breath sounds: Normal breath sounds. No stridor. No rales.  Abdominal:     General: There is no distension.     Palpations: Abdomen is soft.     Tenderness: There is no abdominal tenderness.  Musculoskeletal:        General: No tenderness.     Cervical back: Normal range of motion and neck supple.     Right lower leg: No edema.     Left lower leg: No edema.  Skin:    General: Skin is warm and dry.     Findings: No erythema or rash.  Neurological:      Mental Status: She is alert and oriented to person, place, and time.    ED Results and Treatments Labs (all labs ordered are listed, but only abnormal results are displayed) Labs Reviewed  BASIC METABOLIC PANEL - Abnormal; Notable for the following components:      Result Value   Potassium 3.4 (*)    Glucose, Bld 203 (*)    Calcium 8.5 (*)    All other components within normal limits  CBC  HCG, QUANTITATIVE, PREGNANCY  BRAIN NATRIURETIC PEPTIDE  HEPATIC FUNCTION PANEL  D-DIMER, QUANTITATIVE (NOT AT [facility])  TROPONIN I (HIGH SENSITIVITY)                                                                                                                         EKG  EKG Interpretation  Date/Time:  Tuesday Jul 03 2021 06:07:45 EDT Ventricular Rate:  89 PR Interval:  166 QRS Duration: 93 QT Interval:  390 QTC Calculation: 475 R Axis:   76 Text Interpretation: Sinus rhythm Ventricular premature complex Left ventricular hypertrophy Confirmed by Drema Pry [phone]) on 07/03/2021 7:14:01 AM       Radiology DG Chest 2 View  Result Date: 07/03/2021 CLINICAL DATA:  41 year old female with history of central chest pressure and shortness of breath. Nausea. EXAM: CHEST - 2 VIEW COMPARISON:  No priors. FINDINGS: There is cephalization of the pulmonary vasculature and slight indistinctness of the interstitial markings suggestive of mild pulmonary edema. Trace bilateral pleural effusions. No pneumothorax. Heart size is  mildly enlarged. Upper mediastinal contours are within normal limits. IMPRESSION: 1. The appearance the chest is concerning for developing congestive heart failure, as above. Electronically Signed   By: Trudie Reed M.D.   On: 07/03/2021 06:27    Pertinent labs & imaging results that were available during my care of the patient were reviewed by me and considered in my medical decision making (see MDM for details).  Medications Ordered in ED Medications  nitroGLYCERIN  (NITROGLYN) 2 % ointment 1 inch (1 inch Topical Given 07/03/21 0645)                                                                                                                                     Procedures .Critical Care Performed by: Nira Conn, MD Authorized by: Nira Conn, MD   Critical care provider statement:    Critical care time (minutes):  45   Critical care time was exclusive of:  Separately billable procedures and treating other patients   Critical care was necessary to treat or prevent imminent or life-threatening deterioration of the following conditions:  Circulatory failure and cardiac failure   Critical care was time spent personally by me on the following activities:  Development of treatment plan with patient or surrogate, discussions with consultants, evaluation of patient's response to treatment, examination of patient, obtaining history from patient or surrogate, review of old charts, re-evaluation of patient's condition, pulse oximetry, ordering and review of radiographic studies, ordering and review of laboratory studies and ordering and performing treatments and interventions  (including critical care time)  Medical Decision Making / ED Course    Complexity of Problem:  Co-morbidities/SDOH that complicate the patient evaluation/care: Noted above in HPI  Patient's presenting problem/concern, DDX, and MDM listed below: Chest pain and shortness of breath Presentation is most concerning for hypertensive emergency. We will need to rule out ACS. I have low suspicion for pulmonary embolism. Given the hypertension with pain rating to the back, considering dissection but feel this is less likely  Hospitalization Considered:  Yes  Initial Intervention:  Nitroglycerin paste    Complexity of Data:   Cardiac Monitoring: The patient was maintained on a cardiac monitor.   I personally viewed and interpreted the cardiac monitored which  showed an underlying rhythm of normal sinus rhythm with rates in the 70s to 80s, intermittent PVCs. EKG without acute ischemic changes or evidence of pericarditis.   Laboratory Tests ordered listed below with my independent interpretation: CBC without leukocytosis or anemia Mild hypokalemia.  Hyperglycemia without evidence of DKA.  No other significant electrolyte derangements or renal insufficiency. Initial troponin negative. BNP, Dimer pending   Imaging Studies ordered listed below with my independent interpretation: CXR concerning for pulmonary edema/CHF. Slightly widened mediastinum. No PNA or PTx      ED Course:    Assessment, Add'l Intervention, and Reassessment: Chest pain and shortness of breath Most concerning for hypertensive emergency  leading to HF Will assess for ACS. Lower suspicion for dissection, but if dimer is elevated, a CTA can be ordered to rule it out given the HTN and widened mediastinum. Patient care turned over to oncoming provider. Patient case and results discussed in detail; please see their note for further ED managment.    Final Clinical Impression(s) / ED Diagnoses Final diagnoses:  Hypertensive crisis           This chart was dictated using voice recognition software.  Despite best efforts to proofread,  errors can occur which can change the documentation meaning.    Nira Connardama, Marchello Rothgeb Eduardo, MD 07/03/21 [phone]

## 2021-07-03 NOTE — Progress Notes (Signed)
Transition of Care [facility]) - Emergency Department Mini Assessment   Patient Details  Name: Nicole Parker MRN: [id number] Date of Birth: 1980/12/28  Transition of Care [facility]) CM/SW Contact:    Oletta Cohn, RN Phone Number: 07/03/2021, 9:39 AM   Clinical Narrative:  Rolla Flatten. Lucretia Roers, RN, BSN, [place] [phone]  RNCM set up appointment with Dr Delford Field on TODAY.  Placed appointment on AVS and advised to please arrive 15 min early and take a picture ID and your current medications.  Pt verbalizes understanding of keeping appointment.   ED Mini Assessment: What brought you to the Emergency Department? : (P) Heart beating fast  Barriers to Discharge: (P) ED No Barriers  Barrier interventions: (P) PCP appointment  Means of departure: (P) Car  Interventions which prevented an admission or readmission: (P) PCP Appointment Scheduled, Medication Review    Patient Contact and Communications        ,          Patient states their goals for this hospitalization and ongoing recovery are:: (P) get medicine for my health issues      Admission diagnosis:  No admission diagnoses are documented for this encounter. Patient Active Problem List   Diagnosis Date Noted   Numbness and tingling in right hand 03/20/2021   Acute cystitis without hematuria 02/03/2020   Iron deficiency anemia 04/09/2019   Type 2 diabetes mellitus without complication, without long-term current use of insulin (HCC) 04/07/2019   Class 3 severe obesity due to excess calories with serious comorbidity and body mass index (BMI) of 40.0 to 44.9 in adult [facility]) 04/05/2019   Essential hypertension 04/05/2019   NVD (normal vaginal delivery) 11/03/2013   Active labor 11/02/2013   Preeclampsia 10/14/2013   PCP:  Marcine Matar, MD Pharmacy:   [facility] [address], [place] - [address] [place] [address] Phone: [phone] Fax: [phone]  [facility] at [facility] [address] [place] [address] Phone: [phone] Fax: [phone]  [facility] [address] [place] [address] Phone: [phone] Fax: [phone]

## 2021-07-03 NOTE — ED Triage Notes (Signed)
Pt states that she woke up about an hour ago with central chest pressure, SOB, and nausea.

## 2021-07-03 NOTE — Progress Notes (Signed)
Yates MD paged via Loretha Stapler.

## 2021-07-03 NOTE — H&P (Addendum)
History and Physical    Patient: Nicole Parker [id number] DOB: [birth year] DOA: 07/03/2021 DOS: the patient was seen and examined on 07/03/2021 PCP: Pcp, No  Patient coming from:  [facility]  - lives with her husband and 3 kids.    Chief Complaint: chest pain/shortness of breath  AMN Interpreting service used for entire exam/history   HPI: Nicole Parker is a 41 y.o. female with medical history significant of untreated HTN, untreated T2DM who presented to ED with complaints of chest pain and and shortness of breath.  She woke up this morning and wasn't able to breath and had chest pain and pressure. She has had chest pain a long time ago. She states pain this morning was substernal and radiated to her back. Pain did not radiate down her arm or up her jaw.  Pain described as pressure and she felt like she couldn't breath. She feels better now. She had associated nausea and vomiting, but she had a headache that she gets when her blood pressure is high and she feels like this was similar. She denies any recent weight gain and state she only has leg swelling when she sits for a long time. Denies any orthopnea. Denies any dyspnea on exertion.   She stopped taking her bp medication and diabetes medication over a month ago. She states she had SE with losartan (pain) She was also on norvasc and thought this made her sore as well. She stopped her diabetes medication   Denies any fever/chills or recent illness, vision changes/headaches (had one today with high blood pressure),  cough, abdominal pain, N/V/D, dysuria or leg swelling.    No smoking and does not drink alcohol.   ER Course:  vitals: afebrile, bp: 218/132, HR: 101, RR: 18, oxygen: 97% Pertinent labs: potassium: 3.4, bnp: 105, d-dimer: .64, mag: 1.6 Had a run of vtach, felt dizzy.  CXR: concerning for developing CHF CTA: no evidence of PE. Cardiomegaly with evidence of interstitial pulmonary edema in the lungs and small bilateral  pleural effusions; imaging findings once again concerning for probable congestive heart failure. Ectasia of ascending thoracic aorta 4.1cm, recommend annual imaging.  In ED: cardiology consulted. Given lasix 20mg , mag replacement, lopressor 5mg  and potassium.    Review of Systems: As mentioned in the history of present illness. All other systems reviewed and are negative. Past Medical History:  Diagnosis Date   Gestational diabetes    Hypertension    Pregnancy induced hypertension    Past Surgical History:  Procedure Laterality Date   NO PAST SURGERIES     NO PAST SURGERIES     Social History:  reports that she has never smoked. She has never used smokeless tobacco. She reports that she does not drink alcohol and does not use drugs.  No Known Allergies  No family history on file.  Prior to Admission medications   Medication Sig Start Date End Date Taking? Authorizing Provider  amLODipine (NORVASC) 5 MG tablet Take 1 tablet (5 mg total) by mouth daily. Patient not taking: Reported on 07/03/2021 02/03/20   Mayers, Cari S, PA-C  ferrous sulfate 325 (65 FE) MG tablet Take 1 tablet (325 mg total) by mouth daily with breakfast. Patient not taking: Reported on 07/03/2021 04/09/19   Ladell Pier, MD  metFORMIN (GLUCOPHAGE) 500 MG tablet Take 1 tablet (500 mg total) by mouth daily with breakfast. Patient not taking: Reported on 07/03/2021 04/07/19   Ladell Pier, MD    Physical Exam: Vitals:  07/03/21 1534 07/03/21 1700 07/03/21 1843 07/03/21 2000  BP:  (!) 169/105 (!) 179/102 (!) 177/106  Pulse:  86 91 89  Resp:  20  (!) 23  Temp: 98.3 F (36.8 C) 98.3 F (36.8 C)  98.9 F (37.2 C)  TempSrc: Oral Oral  Oral  SpO2:  97%  98%  Weight:  107.9 kg    Height:       General:  Appears calm and comfortable and is in NAD, moon face. Eyes:  PERRL, EOMI, normal lids, iris ENT:  grossly normal hearing, lips & tongue, mmm; appropriate dentition Neck:  no LAD, masses or  thyromegaly; no carotid bruits Cardiovascular:  RRR, no m/r/g. No LE edema.  Respiratory:   CTA bilaterally with no wheezes/rales/rhonchi.  Normal respiratory effort. Abdomen:  soft, NT, ND, NABS Back:   normal alignment, no CVAT Skin:  no rash or induration seen on limited exam Musculoskeletal:  grossly normal tone BUE/BLE, good ROM, no bony abnormality Lower extremity:  No LE edema.  Limited foot exam with no ulcerations.  2+ distal pulses. Psychiatric:  grossly normal mood and affect, speech fluent and appropriate, AOx3 Neurologic:  CN 2-12 grossly intact, moves all extremities in coordinated fashion, sensation intact   Radiological Exams on Admission: Independently reviewed - see discussion in A/P where applicable  DG Chest 2 View  Result Date: 07/03/2021 CLINICAL DATA:  41 year old female with history of central chest pressure and shortness of breath. Nausea. EXAM: CHEST - 2 VIEW COMPARISON:  No priors. FINDINGS: There is cephalization of the pulmonary vasculature and slight indistinctness of the interstitial markings suggestive of mild pulmonary edema. Trace bilateral pleural effusions. No pneumothorax. Heart size is mildly enlarged. Upper mediastinal contours are within normal limits. IMPRESSION: 1. The appearance the chest is concerning for developing congestive heart failure, as above. Electronically Signed   By: Vinnie Langton M.D.   On: 07/03/2021 06:27   CT Angio Chest PE W and/or Wo Contrast  Result Date: 07/03/2021 CLINICAL DATA:  41 year old female with history of sudden onset of substernal chest pressure radiating to the mid back. Evaluate for pulmonary embolism. EXAM: CT ANGIOGRAPHY CHEST WITH CONTRAST TECHNIQUE: Multidetector CT imaging of the chest was performed using the standard protocol during bolus administration of intravenous contrast. Multiplanar CT image reconstructions and MIPs were obtained to evaluate the vascular anatomy. RADIATION DOSE REDUCTION: This exam was  performed according to the departmental dose-optimization program which includes automated exposure control, adjustment of the mA and/or kV according to patient size and/or use of iterative reconstruction technique. CONTRAST:  172mL OMNIPAQUE IOHEXOL 350 MG/ML SOLN COMPARISON:  No priors. FINDINGS: Cardiovascular: No filling defects within the pulmonary arterial tree to suggest pulmonary embolism. Heart size is mildly enlarged with left ventricular dilatation. There is no significant pericardial fluid, thickening or pericardial calcification. No atherosclerotic calcifications are noted in the thoracic aorta or the coronary arteries. Ectasia of ascending thoracic aorta (4.1 cm in diameter). Mediastinum/Nodes: No pathologically enlarged mediastinal or hilar lymph nodes. Esophagus is unremarkable in appearance. No axillary lymphadenopathy. Lungs/Pleura: Trace bilateral pleural effusions lying dependently. Areas of ground-glass attenuation and interlobular septal thickening in the lungs bilaterally, most evident throughout the mid to lower lungs, likely reflective of interstitial pulmonary edema. No consolidative airspace disease. No definite suspicious appearing pulmonary nodules or masses are noted. Upper Abdomen: Diffuse low attenuation throughout the visualized hepatic parenchyma, indicative of a background of severe hepatic steatosis. Musculoskeletal: There are no aggressive appearing lytic or blastic lesions noted in the visualized  portions of the skeleton. Review of the MIP images confirms the above findings. IMPRESSION: 1. No evidence of pulmonary embolism. 2. Cardiomegaly with evidence of interstitial pulmonary edema in the lungs and small bilateral pleural effusions; imaging findings once again concerning for probable congestive heart failure. 3. Ectasia of ascending thoracic aorta (4.1 cm in diameter). Recommend annual imaging followup by CTA or MRA. This recommendation follows 2010  ACCF/AHA/AATS/ACR/ASA/SCA/SCAI/SIR/STS/SVM Guidelines for the Diagnosis and Management of Patients with Thoracic Aortic Disease. Circulation. 2010; [id number]. Aortic aneurysm NOS (ICD10-I71.9). 4. Hepatic steatosis. Electronically Signed   By: Vinnie Langton M.D.   On: 07/03/2021 09:10    EKG: Independently reviewed.  NSR with rate 90, LVH. Some PVC; nonspecific ST changes with no evidence of acute ischemia. No previous EKG    Labs on Admission: I have personally reviewed the available labs and imaging studies at the time of the admission.  Pertinent labs:    potassium: 3.4,  bnp: 105,  d-dimer: .64,  mag: 1.6  Assessment and Plan: Principal Problem:   Hypertensive emergency Active Problems:   NSVT (nonsustained ventricular tachycardia) (HCC)   Hypokalemia/hypomagnesia    Type 2 diabetes mellitus without complication, without long-term current use of insulin (HCC)   Cushing phenomenon   Elevated d-dimer    Assessment and Plan: * Hypertensive emergency 40 year old female with history of untreated HTN and T2DM who presented to ED with complaints of chest pain and shortness of breath found to have hypertensive emergency with initial blood pressure of 218/132 and findings on chest imaging concerning for developing CHF.  -admit to progressive -chest pain free and no longer short of breath -troponin wnl x 2  -does not appear volume overloaded on exam  -received 20mg  of lasix in ED, follow output and adjust tomorrow per day team if lasix needed/adjusted  -echo ordered, once resulted consult cards if needed. Does have long standing untreated HTN.  -daily weights -strict I/O  -check TSH/UDS -blood pressure control. Has had "AE" to norvasc and losartan -not decompensated, start coreg BID and titrate as needed -hydralazine PRN  -tailor meds once echo results  -concern for cushing's, see below   NSVT (nonsustained ventricular tachycardia) (HCC) Had a run of vtach in ED and she  was dizzy Optimize electrolytes: magnesium and potassium both low EDP discussed case with on call cardiology who reccommended repleteing electrolytes Consult if continues, no more events thus far  Starting beta blocker for HTN   Hypokalemia/hypomagnesia  Potassium 3.4. 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   DVT Prophylaxis: lovenox   Family Communication: none   Severity of Illness: The appropriate patient status for this patient is INPATIENT. Inpatient status is judged to be reasonable and necessary in order to provide the required intensity of service to ensure the patient's safety. The patient's presenting symptoms, physical exam findings, and initial radiographic and laboratory data in the context of their  chronic comorbidities is felt to place them at high risk for further clinical deterioration. Furthermore, it is not anticipated that the patient will be medically stable for discharge from the hospital within 2 midnights of admission.   * I certify that at the point of admission it is my clinical judgment that the patient will require inpatient hospital care spanning beyond 2 midnights from the point of admission due to high intensity of service, high risk for further deterioration and high frequency of surveillance required.*  Author: Orma Flaming, MD 07/03/2021 8:15 PM  For on call review  [email or web address].

## 2021-07-03 NOTE — Assessment & Plan Note (Addendum)
Presneted with BP >210/130 and symptoms of CHF.   BP lowered by 25% in first 24 ours, yesterday further orals added, BP 140s today. - Continue Coreg, Lasix, losartan - Hold prior amlodipine - Follow renal US, PRA

## 2021-07-03 NOTE — Assessment & Plan Note (Signed)
CTA negative for PE No complaints or findings of LE edema

## 2021-07-03 NOTE — Progress Notes (Signed)
Artis Flock MD paged, okay to give Coreg now.

## 2021-07-03 NOTE — ED Notes (Signed)
Patient agreeable to stay for transfer to [facility] Denies needs at this time. Husband reports he is going to get patient something to eat. Verbal ok from MD for same.

## 2021-07-03 NOTE — ED Notes (Signed)
Patient requesting to leave. Patient states she has kids at home and she does not want to wait any longer to be transferred. Haviland MD made aware and asked to come to bedside to speak to patient.

## 2021-07-04 ENCOUNTER — Inpatient Hospital Stay (HOSPITAL_COMMUNITY): Payer: Self-pay

## 2021-07-04 DIAGNOSIS — I4729 Other ventricular tachycardia: Secondary | ICD-10-CM

## 2021-07-04 DIAGNOSIS — R29818 Other symptoms and signs involving the nervous system: Secondary | ICD-10-CM

## 2021-07-04 DIAGNOSIS — I161 Hypertensive emergency: Secondary | ICD-10-CM

## 2021-07-04 DIAGNOSIS — E876 Hypokalemia: Secondary | ICD-10-CM

## 2021-07-04 DIAGNOSIS — I5030 Unspecified diastolic (congestive) heart failure: Secondary | ICD-10-CM

## 2021-07-04 LAB — ECHOCARDIOGRAM COMPLETE
AR max vel: 2.36 cm2
AV Area VTI: 2.41 cm2
AV Area mean vel: 2.24 cm2
AV Mean grad: 5 mmHg
AV Peak grad: 8.9 mmHg
Ao pk vel: 1.49 m/s
Area-P 1/2: 4.21 cm2
Calc EF: 40.8 %
Height: 65 in
S' Lateral: 4.1 cm
Single Plane A2C EF: 38.2 %
Single Plane A4C EF: 40 %
Weight: 3760.17 oz

## 2021-07-04 LAB — GLUCOSE, CAPILLARY
Glucose-Capillary: 146 mg/dL — ABNORMAL HIGH (ref 70–99)
Glucose-Capillary: 252 mg/dL — ABNORMAL HIGH (ref 70–99)
Glucose-Capillary: 154 mg/dL — ABNORMAL HIGH (ref 70–99)
Glucose-Capillary: 155 mg/dL — ABNORMAL HIGH (ref 70–99)

## 2021-07-04 LAB — CBC
HCT: 40.6 % (ref 36.0–46.0)
Hemoglobin: 13.8 g/dL (ref 12.0–15.0)
MCH: 27.8 pg (ref 26.0–34.0)
MCHC: 34 g/dL (ref 30.0–36.0)
MCV: 81.7 fL (ref 80.0–100.0)
Platelets: 269 10*3/uL (ref 150–400)
RBC: 4.97 MIL/uL (ref 3.87–5.11)
RDW: 13 % (ref 11.5–15.5)
WBC: 8.2 10*3/uL (ref 4.0–10.5)
nRBC: 0 % (ref 0.0–0.2)

## 2021-07-04 LAB — HEMOGLOBIN A1C
Hgb A1c MFr Bld: 8 % — ABNORMAL HIGH (ref 4.8–5.6)
Mean Plasma Glucose: 182.9 mg/dL

## 2021-07-04 LAB — BASIC METABOLIC PANEL
Anion gap: 5 (ref 5–15)
BUN: 9 mg/dL (ref 6–20)
CO2: 25 mmol/L (ref 22–32)
Calcium: 8.4 mg/dL — ABNORMAL LOW (ref 8.9–10.3)
Chloride: 104 mmol/L (ref 98–111)
Creatinine, Ser: 0.58 mg/dL (ref 0.44–1.00)
GFR, Estimated: >60 mL/min (ref >60)
Glucose, Bld: 142 mg/dL — ABNORMAL HIGH (ref 70–99)
Potassium: 3.4 mmol/L — ABNORMAL LOW (ref 3.5–5.1)
Sodium: 134 mmol/L — ABNORMAL LOW (ref 135–145)

## 2021-07-04 LAB — LIPID PANEL
Cholesterol: 184 mg/dL (ref 0–200)
HDL: 35 mg/dL — ABNORMAL LOW (ref >40)
LDL Cholesterol: 98 mg/dL (ref 0–99)
Total CHOL/HDL Ratio: 5.3 RATIO
Triglycerides: 255 mg/dL — ABNORMAL HIGH (ref <150)
VLDL: 51 mg/dL — ABNORMAL HIGH (ref 0–40)

## 2021-07-04 LAB — MAGNESIUM: Magnesium: 2 mg/dL (ref 1.7–2.4)

## 2021-07-04 MED ORDER — LIVING WELL WITH DIABETES BOOK - IN SPANISH
Freq: Once | Status: AC
  Filled 2021-07-04: qty 1

## 2021-07-04 MED ORDER — LIVING WELL WITH DIABETES BOOK
Freq: Once | Status: AC
Start: 2021-07-04 — End: 2021-07-04
  Filled 2021-07-04: qty 1

## 2021-07-04 MED ORDER — FUROSEMIDE 10 MG/ML IJ SOLN
40.0000 mg | Freq: Two times a day (BID) | INTRAMUSCULAR | Status: DC
Start: 2021-07-04 — End: 2021-07-04
  Administered 2021-07-04: 40 mg via INTRAVENOUS
  Filled 2021-07-04: qty 4

## 2021-07-04 MED ORDER — LOSARTAN POTASSIUM 50 MG PO TABS
50.0000 mg | ORAL_TABLET | Freq: Every day | ORAL | Status: DC
  Administered 2021-07-04 – 2021-07-05 (×2): 50 mg via ORAL
  Filled 2021-07-04 (×2): qty 1

## 2021-07-04 MED ORDER — POTASSIUM CHLORIDE CRYS ER 20 MEQ PO TBCR
40.0000 meq | EXTENDED_RELEASE_TABLET | Freq: Two times a day (BID) | ORAL | Status: DC
  Administered 2021-07-04 – 2021-07-05 (×4): 40 meq via ORAL
  Filled 2021-07-04 (×4): qty 2

## 2021-07-04 MED ORDER — FUROSEMIDE 20 MG PO TABS
20.0000 mg | ORAL_TABLET | Freq: Every day | ORAL | Status: DC
  Administered 2021-07-04 – 2021-07-06 (×3): 20 mg via ORAL
  Filled 2021-07-04 (×4): qty 1

## 2021-07-04 MED ORDER — DEXAMETHASONE 0.5 MG PO TABS
1.0000 mg | ORAL_TABLET | Freq: Once | ORAL | Status: AC
  Administered 2021-07-04: 1 mg via ORAL
  Filled 2021-07-04: qty 2

## 2021-07-04 NOTE — Progress Notes (Signed)
  Progress Note   Patient: Nicole Parker [id number]:[id number] DOB: 17-Oct-1980 DOA: 07/03/2021     1 DOS: the patient was seen and examined on 07/04/2021        Brief hospital course: Mrs. Nicole Parker is a 41 y.o. F with untreated HTN and DM who presented with shortness of breath and chest pain.  BP 218/132 on admission and had CHF and chest pain.       Assessment and Plan: * Hypertensive emergency Acute CHF, likely diastolic, echo pending - Follow echo - IV Lasix continue - Strict I/Os, daily weights - Continue Coreg - Start losartan   NSVT (nonsustained ventricular tachycardia) (HCC) No further work up  Hypokalemia/hypomagnesia  - Supplement K and Mag  Cushing phenomenon - Dexamethasone supprsesion test overnight - Outpateitn follow up   Type 2 diabetes mellitus without complication, without long-term current use of insulin (HCC) - Check A1c - Contninue SSI and accuchecks QAC/HS  Hyponatremia - Continue Lasix - Trend BMP  Thoracic aortic dilation inciDental finding - Outpatient follow-up         Subjective: Symptoms are considerably better     Physical Exam: Vitals:   07/04/21 0319 07/04/21 0654 07/04/21 0700 07/04/21 1555  BP: 138/85 (!) 172/109 (!) 151/88 (!) 141/99  Pulse: 69 86 78 81  Resp:   20 20  Temp: 98.2 F (36.8 C)  98.1 F (36.7 C) 98.5 F (36.9 C)  TempSrc: Oral  Oral Oral  SpO2: 97%  99% 98%  Weight: 106.6 kg     Height:       Obese adult female, sitting up in bed, eating lunch RRR, no murmurs, no peripheral edema Respiratory effort normal, lungs clear without rales or wheezes  Data Reviewed: Basic metabolic panel reviewed, hemogram reviewed, HIV reviewed, CT angiogram report reviewed, BNP reviewed      Disposition: Status is: Inpatient         Author: Alberteen Sam, MD 07/04/2021 6:54 PM  For on call review [email or web address].

## 2021-07-04 NOTE — Progress Notes (Signed)
Inpatient Diabetes Program Recommendations  AACE/ADA: New Consensus Statement on Inpatient Glycemic Control (2015)  Target Ranges:  Prepandial:   less than 140 mg/dL      Peak postprandial:   less than 180 mg/dL (1-2 hours)      Critically ill patients:  140 - 180 mg/dL   Lab Results  Component Value Date   GLUCAP 252 (H) 07/04/2021   HGBA1C 8.0 (H) 07/04/2021    Review of Glycemic Control  Latest Reference Range & Units 07/03/21 21:10 07/04/21 06:41 07/04/21 11:20  Glucose-Capillary 70 - 99 mg/dL 180 (H) 146 (H) 252 (H)   Diabetes history: DM 2 Outpatient Diabetes medications: None Current orders for Inpatient glycemic control:  Novolog moderate tid with meals   Inpatient Diabetes Program Recommendations:    Spoke with patient using [employer] interpreter in the room.  Patient reports that she does have a history of DM however has not been taking medications for DM recently.  She reports GI upset with metformin and asked if there is another medication we can try? I explained that there were other medications but also told her that metformin should be taken with food and is sometimes better tolerated this way.  She does not have medical insurance?  Note new diagnosis of CHF as well?   We briefly discussed lifestyle modifications for DM including eliminating sugar from beverages (no juice, sodas, sweet tea or lemonade), increasing exercise and reducing serving sizes of CHO.  Also encouraged more non-starchy veggie intake.  Will need close f/u with PCP and f/u for DM as well.  Recommend restarting oral DM medication at d/c.  ? If she can f/u at [facility]?  Possibly restart metformin and use extended release for better tolerance.  ? SGLT-2 if patient can get assistance from [facility] for medications?  Will order DM booklet for patient as well.    Thanks,  Adah Perl, RN, BC-ADM Inpatient Diabetes Coordinator Pager [phone]    ([id number])

## 2021-07-05 ENCOUNTER — Encounter (HOSPITAL_COMMUNITY): Payer: Self-pay | Admitting: Internal Medicine

## 2021-07-05 ENCOUNTER — Other Ambulatory Visit (HOSPITAL_COMMUNITY): Payer: Self-pay

## 2021-07-05 DIAGNOSIS — E269 Hyperaldosteronism, unspecified: Secondary | ICD-10-CM

## 2021-07-05 DIAGNOSIS — I5042 Chronic combined systolic (congestive) and diastolic (congestive) heart failure: Secondary | ICD-10-CM

## 2021-07-05 DIAGNOSIS — E871 Hypo-osmolality and hyponatremia: Secondary | ICD-10-CM

## 2021-07-05 DIAGNOSIS — J810 Acute pulmonary edema: Secondary | ICD-10-CM

## 2021-07-05 DIAGNOSIS — E119 Type 2 diabetes mellitus without complications: Secondary | ICD-10-CM

## 2021-07-05 DIAGNOSIS — I5041 Acute combined systolic (congestive) and diastolic (congestive) heart failure: Secondary | ICD-10-CM

## 2021-07-05 DIAGNOSIS — I712 Thoracic aortic aneurysm, without rupture, unspecified: Secondary | ICD-10-CM

## 2021-07-05 DIAGNOSIS — I5043 Acute on chronic combined systolic (congestive) and diastolic (congestive) heart failure: Secondary | ICD-10-CM

## 2021-07-05 LAB — BASIC METABOLIC PANEL
Anion gap: 7 (ref 5–15)
BUN: 15 mg/dL (ref 6–20)
CO2: 24 mmol/L (ref 22–32)
Calcium: 8.6 mg/dL — ABNORMAL LOW (ref 8.9–10.3)
Chloride: 106 mmol/L (ref 98–111)
Creatinine, Ser: 0.63 mg/dL (ref 0.44–1.00)
GFR, Estimated: >60 mL/min (ref >60)
Glucose, Bld: 151 mg/dL — ABNORMAL HIGH (ref 70–99)
Potassium: 3.8 mmol/L (ref 3.5–5.1)
Sodium: 137 mmol/L (ref 135–145)

## 2021-07-05 LAB — CORTISOL-AM, BLOOD: Cortisol - AM: 0.6 ug/dL — ABNORMAL LOW (ref 6.7–22.6)

## 2021-07-05 LAB — GLUCOSE, CAPILLARY
Glucose-Capillary: 183 mg/dL — ABNORMAL HIGH (ref 70–99)
Glucose-Capillary: 146 mg/dL — ABNORMAL HIGH (ref 70–99)
Glucose-Capillary: 186 mg/dL — ABNORMAL HIGH (ref 70–99)
Glucose-Capillary: 163 mg/dL — ABNORMAL HIGH (ref 70–99)

## 2021-07-05 MED ORDER — METOPROLOL TARTRATE 50 MG PO TABS
50.0000 mg | ORAL_TABLET | Freq: Once | ORAL | Status: AC
  Administered 2021-07-05: 50 mg via ORAL
  Filled 2021-07-05: qty 1

## 2021-07-05 MED ORDER — LOSARTAN POTASSIUM 25 MG PO TABS
25.0000 mg | ORAL_TABLET | Freq: Every day | ORAL | Status: DC
  Administered 2021-07-06: 25 mg via ORAL
  Filled 2021-07-05: qty 1

## 2021-07-05 MED ORDER — SPIRONOLACTONE 12.5 MG HALF TABLET
12.5000 mg | ORAL_TABLET | Freq: Every day | ORAL | Status: DC
  Administered 2021-07-06: 12.5 mg via ORAL
  Filled 2021-07-05 (×2): qty 1

## 2021-07-05 MED ORDER — CARVEDILOL 6.25 MG PO TABS
6.2500 mg | ORAL_TABLET | Freq: Two times a day (BID) | ORAL | Status: DC
  Administered 2021-07-05: 6.25 mg via ORAL
  Filled 2021-07-05: qty 1

## 2021-07-05 NOTE — Consult Note (Addendum)
Cardiology Consultation:   Patient ID: Nicole Parker MRN: [id number]; DOB: 13-Jun-1980  Admit date: 07/03/2021 Date of Consult: 07/05/2021  PCP:  Merryl Hacker, No   [facility] Providers Cardiologist:  None      Patient Profile:   Nicole Parker is a 41 y.o. female with a hx of untreated HTN, untreated type 2 Dm who is being seen 07/05/2021 for the evaluation of CHF, chest pain at the request of Dr. Loleta Books.  History of Present Illness:   Nicole Parker is a 41 year old woman with above medical history. Per chart review, patient does not have any past cardiac history and has never seen a cardiologist.   Patient presented to the ED on 5/30 complaining of central chest pressure, sob, and nausea. On presentation, patient was hypertensive with BP 218/132. Patient was oxygenating well on room air. Labs in the ED showed Na 135, K 3.4, creatinine 0.54, WBC 8.2, hemoglobin 13.5, platelets 283. hsTn negative x2 (8>>6). BNP mildly elevated to 105.8.  CXR showed findings suggestive of congestive heart failure (mild pulmonary edema, trace bilateral pleural effusions, mildly enlarged heart size).  EKG showed sinus rhythm, PVC present.  CTA chest was negative for PE, showed cardiomegaly with evidence of interstitial pulmonary edema in the lungs and small bilateral pleural effusion, ectasia of ascending thoracic aorta (4.1 cm in diameter).   Echocardiogram on 5/31 showed global hypokinesis that is worse in the anterolateral myocardium, LVEF 40-45%, regional wall motion abnormalities present, grade II diastolic dysfunction. Normal RV systolic function. Normal pulmonary artery systolic pressure. Mild dilation of the aortic root measuring 37 mm. Cardiology was asked to consult due to newly reduced EF.   Patient was interviewed with the assistance of a Administrator, sports. On interview, she reported that she was laying in bed Monday night when she felt pressure in the middle of her chest and had  trouble breathing. Chest pressure radiated somewhat to her back/left shoulder blade. Did not radiate to neck or left arm. She admitted to having similar pain in the past, but not as severe. She denies having any recent chest pain on exertion. Patient also experienced shortness of breath, worse when laying flat on her back and improved by sitting up/leaning forward. Denies any recent shortness of breath on exertion, even when she works long hours at [employer]. Reports noticing some fatigue/weakness on Monday before the event. She denies any nausea, vomiting. Patient was prescribed medicines for her blood pressure, but she was not taking them because she believed that they caused right arm pain. Admits to having a history of diabetes. Denies having personal or family history of cardiac disease. Does not have HLD, does not drink alcohol or use tobacco products.     Past Medical History:  Diagnosis Date   Gestational diabetes    Hypertension    Pregnancy induced hypertension     Past Surgical History:  Procedure Laterality Date   NO PAST SURGERIES     NO PAST SURGERIES       Home Medications:  Prior to Admission medications   Medication Sig Start Date End Date Taking? Authorizing Provider  amLODipine (NORVASC) 5 MG tablet Take 1 tablet (5 mg total) by mouth daily. Patient not taking: Reported on 07/03/2021 02/03/20   Mayers, Cari S, PA-C  ferrous sulfate 325 (65 FE) MG tablet Take 1 tablet (325 mg total) by mouth daily with breakfast. Patient not taking: Reported on 07/03/2021 04/09/19   Ladell Pier, MD  metFORMIN (GLUCOPHAGE) 500  MG tablet Take 1 tablet (500 mg total) by mouth daily with breakfast. Patient not taking: Reported on 07/03/2021 04/07/19   Ladell Pier, MD    Inpatient Medications: Scheduled Meds:  carvedilol  6.25 mg Oral BID WC   enoxaparin (LOVENOX) injection  40 mg Subcutaneous Q24H   furosemide  20 mg Oral Daily   insulin aspart  0-15 Units Subcutaneous TID WC    [START ON 07/06/2021] losartan  25 mg Oral Daily   potassium chloride  40 mEq Oral BID   sodium chloride flush  3 mL Intravenous Q12H   Continuous Infusions:  sodium chloride     PRN Meds: sodium chloride, acetaminophen **OR** acetaminophen, hydrALAZINE, sodium chloride flush  Allergies:   No Known Allergies  Social History:   Social History   Socioeconomic History   Marital status: Married    Spouse name: Not on file   Number of children: 4   Years of education: 9 th grade   Highest education level: Not on file  Occupational History    Comment: employed  Tobacco Use   Smoking status: Never   Smokeless tobacco: Never  Vaping Use   Vaping Use: Never used  Substance and Sexual Activity   Alcohol use: No   Drug use: No   Sexual activity: Yes    Birth control/protection: None  Other Topics Concern   Not on file  Social History Narrative   She is able to read in spanish   Social Determinants of Health   Financial Resource Strain: Low Risk    Difficulty of Paying Living Expenses: Not very hard  Food Insecurity: No Food Insecurity   Worried About Charity fundraiser in the Last Year: Never true   Arboriculturist in the Last Year: Never true  Transportation Needs: No Transportation Needs   Lack of Transportation (Medical): No   Lack of Transportation (Non-Medical): No  Physical Activity: Not on file  Stress: Not on file  Social Connections: Not on file  Intimate Partner Violence: Not on file    Family History:   No family history on file.   ROS:  Please see the history of present illness.   All other ROS reviewed and negative.     Physical Exam/Data:   Vitals:   07/05/21 0500 07/05/21 0614 07/05/21 0817 07/05/21 1223  BP:  (!) 144/94 (!) 153/92 (!) 141/92  Pulse:  83 70 89  Resp:   18 20  Temp:   97.6 F (36.4 C) 98.1 F (36.7 C)  TempSrc:   Oral Oral  SpO2:   92% 92%  Weight: 106.3 kg     Height:       No intake or output data in the 24 hours  ending 07/05/21 1631    07/05/2021    5:00 AM 07/04/2021    3:19 AM 07/03/2021    5:00 PM  Last 3 Weights  Weight (lbs) 234 lb 5.6 oz 235 lb 0.2 oz 237 lb 14 oz  Weight (kg) 106.3 kg 106.6 kg 107.9 kg     Body mass index is 39 kg/m.  General:  Pleasant, obese middle-aged female sitting in the bed in no acute distress HEENT: normal Neck: no JVD Vascular: Radial pulses 2+ bilaterally Cardiac:  normal S1, S2; RRR; no murmur  Lungs:  clear to auscultation bilaterally, no wheezing, rhonchi or rales  Abd: soft, nontender Ext: no edema Musculoskeletal:  No deformities, BUE and BLE strength normal and equal Skin: warm  and dry  Neuro:  CNs 2-12 intact, no focal abnormalities noted Psych:  Normal affect   EKG:  The EKG was personally reviewed and demonstrates:  Sinus rhythm, PVC present, LVH Telemetry:  Telemetry was personally reviewed and demonstrates:  Sinus rhythm with occasional PVCs   Relevant CV Studies:  Echocardiogram 07/04/21   1. Global hypokinesis worse in the anterolateral myocardium. Left  ventricular ejection fraction, by estimation, is 40 to 45%. The left  ventricle has mildly decreased function. The left ventricle demonstrates  regional wall motion abnormalities (see  scoring diagram/findings for description). There is moderate concentric  left ventricular hypertrophy. Left ventricular diastolic parameters are  consistent with Grade II diastolic dysfunction (pseudonormalization).  Elevated left ventricular end-diastolic  pressure. The average left ventricular global longitudinal strain is -11.7  %. The global longitudinal strain is abnormal.   2. Right ventricular systolic function is normal. The right ventricular  size is normal. There is normal pulmonary artery systolic pressure.   3. The mitral valve is normal in structure. No evidence of mitral valve  regurgitation. No evidence of mitral stenosis.   4. The aortic valve was not well visualized. Aortic valve  regurgitation  is not visualized. No aortic stenosis is present.   5. Aortic dilatation noted. There is mild dilatation of the aortic root,  measuring 37 mm.   6. The inferior vena cava is normal in size with greater than 50%  respiratory variability, suggesting right atrial pressure of 3 mmHg.    Laboratory Data:  High Sensitivity Troponin:   Recent Labs  Lab 07/03/21 0624 07/03/21 0751  TROPONINIHS 8 6     Chemistry Recent Labs  Lab 07/03/21 0624 07/03/21 0751 07/04/21 0048 07/05/21 0124  NA 135  --  134* 137  K 3.4*  --  3.4* 3.8  CL 99  --  104 106  CO2 23  --  25 24  GLUCOSE 203*  --  142* 151*  BUN 15  --  9 15  CREATININE 0.54  --  0.58 0.63  CALCIUM 8.5*  --  8.4* 8.6*  MG  --  1.6* 2.0  --   GFRNONAA >60  --  >60 >60  ANIONGAP 13  --  5 7    Recent Labs  Lab 07/03/21 0641  PROT 7.5  ALBUMIN 4.0  AST 24  ALT 26  ALKPHOS 67  BILITOT 0.5   Lipids  Recent Labs  Lab 07/04/21 0048  CHOL 184  TRIG 255*  HDL 35*  LDLCALC 98  CHOLHDL 5.3    Hematology Recent Labs  Lab 07/03/21 0624 07/04/21 0048  WBC 8.2 8.2  RBC 4.93 4.97  HGB 13.5 13.8  HCT 40.2 40.6  MCV 81.5 81.7  MCH 27.4 27.8  MCHC 33.6 34.0  RDW 13.0 13.0  PLT 283 269   Thyroid  Recent Labs  Lab 07/03/21 1833  TSH 2.005    BNP Recent Labs  Lab 07/03/21 0641  BNP 105.8*    DDimer  Recent Labs  Lab 07/03/21 0735  DDIMER 0.64*     Radiology/Studies:  DG Chest 2 View  Result Date: 07/03/2021 CLINICAL DATA:  41 year old female with history of central chest pressure and shortness of breath. Nausea. EXAM: CHEST - 2 VIEW COMPARISON:  No priors. FINDINGS: There is cephalization of the pulmonary vasculature and slight indistinctness of the interstitial markings suggestive of mild pulmonary edema. Trace bilateral pleural effusions. No pneumothorax. Heart size is mildly enlarged. Upper mediastinal contours are within  normal limits. IMPRESSION: 1. The appearance the chest is  concerning for developing congestive heart failure, as above. Electronically Signed   By: Vinnie Langton M.D.   On: 07/03/2021 06:27   CT Angio Chest PE W and/or Wo Contrast  Result Date: 07/03/2021 CLINICAL DATA:  41 year old female with history of sudden onset of substernal chest pressure radiating to the mid back. Evaluate for pulmonary embolism. EXAM: CT ANGIOGRAPHY CHEST WITH CONTRAST TECHNIQUE: Multidetector CT imaging of the chest was performed using the standard protocol during bolus administration of intravenous contrast. Multiplanar CT image reconstructions and MIPs were obtained to evaluate the vascular anatomy. RADIATION DOSE REDUCTION: This exam was performed according to the departmental dose-optimization program which includes automated exposure control, adjustment of the mA and/or kV according to patient size and/or use of iterative reconstruction technique. CONTRAST:  134mL OMNIPAQUE IOHEXOL 350 MG/ML SOLN COMPARISON:  No priors. FINDINGS: Cardiovascular: No filling defects within the pulmonary arterial tree to suggest pulmonary embolism. Heart size is mildly enlarged with left ventricular dilatation. There is no significant pericardial fluid, thickening or pericardial calcification. No atherosclerotic calcifications are noted in the thoracic aorta or the coronary arteries. Ectasia of ascending thoracic aorta (4.1 cm in diameter). Mediastinum/Nodes: No pathologically enlarged mediastinal or hilar lymph nodes. Esophagus is unremarkable in appearance. No axillary lymphadenopathy. Lungs/Pleura: Trace bilateral pleural effusions lying dependently. Areas of ground-glass attenuation and interlobular septal thickening in the lungs bilaterally, most evident throughout the mid to lower lungs, likely reflective of interstitial pulmonary edema. No consolidative airspace disease. No definite suspicious appearing pulmonary nodules or masses are noted. Upper Abdomen: Diffuse low attenuation throughout the  visualized hepatic parenchyma, indicative of a background of severe hepatic steatosis. Musculoskeletal: There are no aggressive appearing lytic or blastic lesions noted in the visualized portions of the skeleton. Review of the MIP images confirms the above findings. IMPRESSION: 1. No evidence of pulmonary embolism. 2. Cardiomegaly with evidence of interstitial pulmonary edema in the lungs and small bilateral pleural effusions; imaging findings once again concerning for probable congestive heart failure. 3. Ectasia of ascending thoracic aorta (4.1 cm in diameter). Recommend annual imaging followup by CTA or MRA. This recommendation follows 2010 ACCF/AHA/AATS/ACR/ASA/SCA/SCAI/SIR/STS/SVM Guidelines for the Diagnosis and Management of Patients with Thoracic Aortic Disease. Circulation. 2010; [id number]. Aortic aneurysm NOS (ICD10-I71.9). 4. Hepatic steatosis. Electronically Signed   By: Vinnie Langton M.D.   On: 07/03/2021 09:10   ECHOCARDIOGRAM COMPLETE  Result Date: 07/04/2021    ECHOCARDIOGRAM REPORT   Patient Name:   MAUDEAN STIRRAT Date of Exam: 07/04/2021 Medical Rec #:  [id number]              Height:       65.0 in Accession #:    [id number]             Weight:       235.0 lb Date of Birth:  May 10, 1980               BSA:          2.119 m Patient Age:    40 years               BP:           151/88 mmHg Patient Gender: F                      HR:           79 bpm. Exam Location:  Inpatient Procedure: 2D Echo,  3D Echo, Cardiac Doppler, Color Doppler and Strain Analysis Indications:    CHF  History:        Patient has no prior history of Echocardiogram examinations.                 Risk Factors:Diabetes and Hypertension.  Sonographer:    Luisa Hart RDCS Referring Phys: [id number] [place]  1. Global hypokinesis worse in the anterolateral myocardium. Left ventricular ejection fraction, by estimation, is 40 to 45%. The left ventricle has mildly decreased function. The left ventricle  demonstrates regional wall motion abnormalities (see scoring diagram/findings for description). There is moderate concentric left ventricular hypertrophy. Left ventricular diastolic parameters are consistent with Grade II diastolic dysfunction (pseudonormalization). Elevated left ventricular end-diastolic pressure. The average left ventricular global longitudinal strain is -11.7 %. The global longitudinal strain is abnormal.  2. Right ventricular systolic function is normal. The right ventricular size is normal. There is normal pulmonary artery systolic pressure.  3. The mitral valve is normal in structure. No evidence of mitral valve regurgitation. No evidence of mitral stenosis.  4. The aortic valve was not well visualized. Aortic valve regurgitation is not visualized. No aortic stenosis is present.  5. Aortic dilatation noted. There is mild dilatation of the aortic root, measuring 37 mm.  6. The inferior vena cava is normal in size with greater than 50% respiratory variability, suggesting right atrial pressure of 3 mmHg. FINDINGS  Left Ventricle: Global hypokinesis worse in the anterolateral myocardium. Left ventricular ejection fraction, by estimation, is 40 to 45%. The left ventricle has mildly decreased function. The left ventricle demonstrates regional wall motion abnormalities. The average left ventricular global longitudinal strain is -11.7 %. The global longitudinal strain is abnormal. The left ventricular internal cavity size was normal in size. There is moderate concentric left ventricular hypertrophy. Left ventricular diastolic parameters are consistent with Grade II diastolic dysfunction (pseudonormalization). Elevated left ventricular end-diastolic pressure. Right Ventricle: The right ventricular size is normal. No increase in right ventricular wall thickness. Right ventricular systolic function is normal. There is normal pulmonary artery systolic pressure. The tricuspid regurgitant velocity is 2.29  m/s, and  with an assumed right atrial pressure of 3 mmHg, the estimated right ventricular systolic pressure is 0000000 mmHg. Left Atrium: Left atrial size was normal in size. Right Atrium: Right atrial size was normal in size. Pericardium: There is no evidence of pericardial effusion. Mitral Valve: The mitral valve is normal in structure. No evidence of mitral valve regurgitation. No evidence of mitral valve stenosis. Tricuspid Valve: The tricuspid valve is normal in structure. Tricuspid valve regurgitation is trivial. No evidence of tricuspid stenosis. Aortic Valve: The aortic valve was not well visualized. Aortic valve regurgitation is not visualized. No aortic stenosis is present. Aortic valve mean gradient measures 5.0 mmHg. Aortic valve peak gradient measures 8.9 mmHg. Aortic valve area, by VTI measures 2.41 cm. Pulmonic Valve: The pulmonic valve was normal in structure. Pulmonic valve regurgitation is not visualized. No evidence of pulmonic stenosis. Aorta: Aortic dilatation noted. There is mild dilatation of the aortic root, measuring 37 mm. Venous: The inferior vena cava is normal in size with greater than 50% respiratory variability, suggesting right atrial pressure of 3 mmHg. IAS/Shunts: No atrial level shunt detected by color flow Doppler.  LEFT VENTRICLE PLAX 2D LVIDd:         5.30 cm     Diastology LVIDs:         4.10 cm     LV e'  medial:    4.78 cm/s LV PW:         1.30 cm     LV E/e' medial:  19.7 LV IVS:        1.40 cm     LV e' lateral:   4.54 cm/s LVOT diam:     2.30 cm     LV E/e' lateral: 20.8 LV SV:         65 LV SV Index:   31          2D Longitudinal Strain LVOT Area:     4.15 cm    2D Strain GLS (A2C):   -8.1 %                            2D Strain GLS (A3C):   -13.4 %                            2D Strain GLS (A4C):   -13.8 % LV Volumes (MOD)           2D Strain GLS Avg:     -11.7 % LV vol d, MOD A2C: 76.8 ml LV vol d, MOD A4C: 82.9 ml LV vol s, MOD A2C: 47.5 ml LV vol s, MOD A4C: 49.7 ml LV  SV MOD A2C:     29.3 ml LV SV MOD A4C:     82.9 ml LV SV MOD BP:      33.3 ml RIGHT VENTRICLE RV Basal diam:  3.90 cm RV Mid diam:    1.60 cm RV S prime:     12.30 cm/s TAPSE (M-mode): 1.8 cm LEFT ATRIUM           Index        RIGHT ATRIUM           Index LA diam:      3.00 cm 1.42 cm/m   RA Area:     14.90 cm LA Vol (A2C): 61.5 ml 29.03 ml/m  RA Volume:   33.40 ml  15.77 ml/m LA Vol (A4C): 51.4 ml 24.26 ml/m  AORTIC VALVE                     PULMONIC VALVE AV Area (Vmax):    2.36 cm      PV Vmax:       0.82 m/s AV Area (Vmean):   2.24 cm      PV Vmean:      63.200 cm/s AV Area (VTI):     2.41 cm      PV VTI:        0.218 m AV Vmax:           149.00 cm/s   PV Peak grad:  2.7 mmHg AV Vmean:          101.000 cm/s  PV Mean grad:  2.0 mmHg AV VTI:            0.269 m AV Peak Grad:      8.9 mmHg AV Mean Grad:      5.0 mmHg LVOT Vmax:         84.80 cm/s LVOT Vmean:        54.400 cm/s LVOT VTI:          0.156 m LVOT/AV VTI ratio: 0.58  AORTA Ao Root diam: 3.70 cm Ao Asc diam:  3.70 cm MITRAL VALVE  TRICUSPID VALVE MV Area (PHT): 4.21 cm    TR Peak grad:   21.0 mmHg MV Decel Time: 180 msec    TR Vmax:        229.00 cm/s MV E velocity: 94.30 cm/s MV A velocity: 67.30 cm/s  SHUNTS MV E/A ratio:  1.40        Systemic VTI:  0.16 m                            Systemic Diam: 2.30 cm Skeet Latch MD Electronically signed by Skeet Latch MD Signature Date/Time: 07/04/2021/4:03:58 PM    Final      Assessment and Plan:   Newly diagnosed HFrEF  - Patient denies having any past cardiac history, no previous echocardiograms - Echocardiogram this admission was taken when her BP was 151/88. Showed EF 40-45%, moderate LVH with grade II diastolic dysfunction, regional wall motion abnormalities present  - Suspect her CHF could be caused by longstanding/poorly treated HTN. However, patient should undergo ischemic evaluation to rule out CAD  - Plan for coronary CT tomorrow  - If coronary CT does not show  significant heart disease, plan to repeat echocardiogram after 3 months of BP management  - Patient was given losartan 50 mg today, plan to transition to entresto this admission if not too costly (Patient does not have insurance) - Start spironolactone 12.5 mg daily tomorrow  - Increase carvedilol to 6.25 mg BID  - Continue lasix   Chest Pain  - Patient presented complaining of chest pressure in the middle of her chest  - EKG showed LVH, no ischemic changes - hsTn 8>>6  - Pain was relieved by diuresis/BP management  - Suspect pain was secondary to HTN Emergency - coronary CT as above   Hypertensive Emergency  - Ordered renal artery ultrasound for tomorrow  - Also ordered aldosterone + renin activity w/ ratio  - Otherwise, per primary   Thoracic aortic dilation  - Dilated to 37 mm on echocardiogram  - Follow as an outpatient   Otherwise per primary  - Type 2 DM  - Electrolyte abnormalities    Risk Assessment/Risk Scores:   HEAR Score (for undifferentiated chest pain):  HEAR Score: 4{   New York Heart Association (NYHA) Functional Class NYHA Class II        For questions or updates, please contact [facility] Please consult [email or web address] for contact info under    Signed, Margie Billet, PA-C  07/05/2021 4:31 PM  I have seen and examined the patient along with Margie Billet, PA-C .  I have reviewed the chart, notes and new data.  I agree with PA/NP's note.  Key new complaints: Has had hypertension since her last pregnancy about 8 years ago.  To a large extent she has ignored it since pregnancy and has not treated her hypertension.  Presents with classic symptoms of left heart failure including orthopnea and PND.  She did have some chest discomfort upon presentation yesterday, but this was not strongly suggestive of angina. Key examination changes: Obese, no bruits heard over the abdomen.  Regular rate and rhythm, no murmurs or gallops.  No radial femoral  delay. Key new findings / data: I have reviewed the echocardiogram.  I agree that LVEF is mildly depressed around 40-45%, but I think the pattern is global.  I do not think there is distinct anterolateral hypokinesis.  There is clear evidence of severe diastolic dysfunction with  severely elevated mean left atrial pressure.  There are no significant valvular abnormalities.  The presence of LVH suggest chronic hypertension.  ECG also shows changes of LVH, without any convincing ischemic abnormalities.  Note that repeatedly she presents with hypokalemia over the last 8 years.  She has normal renal function. Findings consistent with pulmonary edema on imaging studies.  Do not see any evidence of atherosclerotic calcification in the distribution of her coronaries, thoracic aorta and its branches, upper abdominal aorta celiac artery, superior mesenteric artery and renal artery; right renal artery is not seen.  PLAN: She has findings that suggest she could have secondary hypertension with hyperaldosteronism.  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 If she does not have renal artery stenosis, she could have primary hyperaldosteronism.   - Check renal artery duplex ultrasound - Have sent off a renin aldosterone ratio.  This may be altered by the fact that she has already received a dose of losartan, but if the aldosterone level comes back elevated with suppressed renin, I think it still would be useful. - Spironolactone should be very helpful in treating her hypertension if the above hypothesis is correct. - Because of the report of regional wall motion abnormalities, it is reasonable to check a coronary CT angiogram (but I strongly doubt that she has low EF due to coronary insufficiency).  Very hopeful that LVEF will completely return to normal with treatment of hypertension.  Optimal treatment of her  hypertension and CHF would be with aldosterone antagonists, angiotensin receptor blockers, beta-blockers (especially carvedilol), which is what she has already been started on.  We will repeat an echocardiogram roughly 3 months after her blood pressure has been returned to normal.  Prefer to use generic drugs due to financial/insurance limitations.  Sanda Klein, MD, [place] [phone] 07/05/2021, 4:46 PM

## 2021-07-05 NOTE — Progress Notes (Signed)
Heart Failure Stewardship Pharmacist Progress Note   PCP: Pcp, No PCP-Cardiologist: None    HPI:  41 yo F with PMH of HTN and diabetes who stopped medication due to side effects. She presented to the ED on /30 with chest pressure, shortness of breath, orthopnea, and nausea. Had an episode of NSVT in the ED. CXR suggestive of CHF. CTA negative for PE with evidence of interstitial pulmonary edema. An ECHO was done on 5/31 and LVEF was 40-45% with moderate LVH and G2DD. She received 2 doses of IV lasix and has been transitioned to oral.  Current HF Medications: Diuretic: furosemide 20 mg PO daily Beta Blocker: carvedilol 3.125 mg BID ACE/ARB/ARNI: losartan 50 mg daily Other: KCl 40 mEq BID  Prior to admission HF Medications: None  Pertinent Lab Values: Serum creatinine 0.63, BUN 15, Potassium 3.8, Sodium 137, BNP 105.8, Magnesium 2.0, A1c 8.0  Vital Signs: Weight: 234 lbs (admission weight: 237 lbs) Blood pressure: 140-150/90s  Heart rate: 70-80s  I/O: not recorded  Medication Assistance / Insurance Benefits Check: Does the patient have prescription insurance?  No  Does the patient qualify for medication assistance through manufacturers or grants?   Pending Eligible grants and/or patient assistance programs: pending Medication assistance applications in progress: none  Medication assistance applications approved: none Approved medication assistance renewals will be completed by: pending  Outpatient Pharmacy:  Prior to admission outpatient pharmacy: [facility] Is the patient willing to use [facility] pharmacy at discharge? Yes Is the patient willing to transition their outpatient pharmacy to utilize a [facility] outpatient pharmacy?   Pending    Assessment: 1. 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 NYHA class II symptoms. Cardiology consulted.  - Consider resuming IV lasix if remains volume overloaded - KCl 40 mEq BID ordered - keep K >4 and mag >2 - Continue  carvedilol 3.125 mg BID - Continue losartan 50 mg daily - consider optimizing to Entresto 24/26 mg BID - Consider adding spironolactone and Farxiga 10 mg daily prior to discharge   Plan: 1) Medication changes recommended at this time: - Recommend R/LHC to assess ICM vs NICM and hemodynamics - Stop losartan - Start Entresto 24/26 mg BID - Start Farxiga 10 mg daily  2) Patient assistance: - Patient is uninsured - will need MATCH and patient assistance through manufacturer as meds are added  3)  Education  - To be completed prior to discharge  Kerby Nora, PharmD, BCPS Heart Failure Cytogeneticist Phone [phone]

## 2021-07-05 NOTE — Assessment & Plan Note (Signed)
BMI 39 with diabetes and hypertension

## 2021-07-05 NOTE — Hospital Course (Addendum)
Nicole Parker is a 41 y.o. F with untreated HTN and DM who presented with shortness of breath and chest pain.   BP 218/132 on admission and had clinical CHF and chest pain.      5/30: Admitted and started on Lasix, antihypertensives 5/31: Symptoms resolved, Lasix contined, echo showed EF 40-45%, RWMA 6/1: Cardiology consulted

## 2021-07-05 NOTE — Progress Notes (Signed)
Heart Failure Nurse Navigator Progress Note  PCP: Pcp, No PCP-Cardiologist: none Admission Diagnosis: Hypertensive crisis Admitted from: home  Presentation:   Nicole Parker presented with shortness of breath, centralized chest pain, and nausea.Patient states she has been noncompliant with her BP medications because it makes her right arm feel funny. BP 218/132. HR 79, Nitro ointment was applied, during ER hold had a episode of non sustained VT, admitted.   With the use of a spanish interpretor, patient was educated on the sign and symptoms of heart failure, daily weights, when to call her doctor or go to the ER, taking all medications as prescribed and keeping her appointments. Patient was very overwhelmed by all the information and being so young, she is worried about missing work, but knows she needs to take care of herself, so she can care for her 4 children. Patient is scheduled for a hospital follow up on 07/18/21 @ 2 pm. Pharmacy was consulted to speak with patient about the side effects she felt while taking her BP medication.   ECHO/ LVEF: 40-45% (New)  Clinical Course:  Past Medical History:  Diagnosis Date   Gestational diabetes    Hypertension    Pregnancy induced hypertension      Social History   Socioeconomic History   Marital status: Married    Spouse name: Not on file   Number of children: 4   Years of education: 9 th grade   Highest education level: Not on file  Occupational History    Comment: employed  Tobacco Use   Smoking status: Never   Smokeless tobacco: Never  Vaping Use   Vaping Use: Never used  Substance and Sexual Activity   Alcohol use: No   Drug use: No   Sexual activity: Yes    Birth control/protection: None  Other Topics Concern   Not on file  Social History Narrative   She is able to read in spanish   Social Determinants of Health   Financial Resource Strain: Low Risk    Difficulty of Paying Living Expenses: Not very hard   Food Insecurity: No Food Insecurity   Worried About Charity fundraiser in the Last Year: Never true   [place] in the Last Year: Never true  Transportation Needs: No Transportation Needs   Lack of Transportation (Medical): No   Lack of Transportation (Non-Medical): No  Physical Activity: Not on file  Stress: Not on file  Social Connections: Not on file    High Risk Criteria for Readmission and/or Poor Patient Outcomes: Heart failure hospital admissions (last 6 months): 1  No Show rate: 25 % Difficult social situation: No Demonstrates medication adherence: No, stopped taking her heart medications. Primary Language: Spanish Literacy level: Reading, writing, and comprehension  Barriers of Care:   Medication compliance Diet/ fluid restrictions New CHF  Considerations/Referrals:   Referral made to Heart Failure Pharmacist Stewardship: yes Referral made to Heart Failure CSW/NCM TOC: No Referral made to Heart & Vascular TOC clinic: Yes, 07/18/21 @ 2 pm  Items for Follow-up on DC/TOC: Medication compliance Diet/ fluid restrictions Cont. Education on new CHF   Earnestine Leys, Copywriter, advertising, Clinical cytogeneticist Only

## 2021-07-05 NOTE — Assessment & Plan Note (Signed)
Resolved

## 2021-07-05 NOTE — Assessment & Plan Note (Signed)
Incidental finding.  4.1cm on CTA.   - Outpatient follow up in 1 year

## 2021-07-05 NOTE — Assessment & Plan Note (Signed)
Presented with dyspnea on exertion and imaging showing edema and effusions.   Echo shows new EF 40-45% with grade II DD and RWMA anterolaterally and normal valves. Symptomatically resolved now - Continue lasix PO - Start PO K - STrict I/Os, daily BMP - Consult Cardiology regarding reduced EF, appreciate expertise - Start carvedilol and losartan  - NOTE: patient has had adverse symptoms with lisinopril and losartan in the past, no angioedema or hypersensitivity but severe headaches, arm pain and malaise that caused her to stop all BP meds 3 months prior to admission

## 2021-07-05 NOTE — Progress Notes (Signed)
Progress Note   Patient: Nicole Parker XNA:[id number] DOB: 03-07-1980 DOA: 07/03/2021     2 DOS: the patient was seen and examined on 07/05/2021 at 9:30AM      Brief hospital course: [name] is a 41 y.o. F with untreated HTN and DM who presented with shortness of breath and chest pain.   BP 218/132 on admission and had clinical CHF and chest pain.      5/30: Admitted and started on Lasix, antihypertensives 5/31: Symptoms resolved, Lasix contined, echo showed EF 40-45%, RWMA 6/1: Cardiology consulted, CCTA and renal duplex ordered      Assessment and Plan: * Acute on chronic combined systolic and diastolic CHF (congestive heart failure) (HCC) Presented with dyspnea on exertion and imaging showing edema and effusions.   Echo shows new EF 40-45% with grade II DD and RWMA anterolaterally and normal valves. Symptomatically resolved now - Continue lasix PO - Start PO K - STrict I/Os, daily BMP - Consult Cardiology regarding reduced EF, appreciate expertise - Start carvedilol and losartan  - NOTE: patient has had adverse symptoms with lisinopril and losartan in the past, no angioedema or hypersensitivity but severe headaches, arm pain and malaise that caused her to stop all BP meds 3 months prior to admission  Hypertensive emergency Presneted with BP >210/130 and symptoms of CHF.   BP lowered by 25% in first 24 ours, yesterday further orals added, BP 140s today. - Continue Coreg, Lasix, losartan - Hold prior amlodipine - Follow renal US, PRA  Elevated d-dimer CTA negative for PE No complaints or findings of LE edema   Thoracic aortic aneurysm (HCC) Incidental finding.  4.1cm on CTA.   - Outpatient follow up in 1 year  Hyponatremia Resolved  Cushing phenomenon There was some initial concern for Cushingoid features.  Overnight dexamethasone suppression was normal, I think Cushings fairly unlikely.  Not unreasonable to check salivary cortisol after d/c.     Hypokalemia/hypomagnesia  K and mag repleted and resolved.   NSVT (nonsustained ventricular tachycardia) (HCC) Due to CHF, likely ischemia.   - Keep K and Mag replete  Type 2 diabetes mellitus without complication, without long-term current use of insulin (HCC) Well controlled ehre.  A1c 8%, close to good control off meds. - Hold metformin - Continue SS corrections  Class 3 severe obesity due to excess calories with serious comorbidity and body mass index (BMI) of 40.0 to 44.9 in adult (HCC) BMI 39 with diabetes and hypertension          Subjective: No symptoms of swelling, chest pain, dyspnea, orthopnea.     Physical Exam: Vitals:   07/05/21 0500 07/05/21 0614 07/05/21 0817 07/05/21 1223  BP:  (!) 144/94 (!) 153/92 (!) 141/92  Pulse:  83 70 89  Resp:   18 20  Temp:   97.6 F (36.4 C) 98.1 F (36.7 C)  TempSrc:   Oral Oral  SpO2:   92% 92%  Weight: 106.3 kg     Height:       Obese adult female, sitting in the edge of the bed, pleasant and interactive.  She asks that a family member who works in healthcare provides translation. RRR, I do not appreciate murmurs, JVP not visible due to body habitus, no peripheral edema Respiratory effort normal, lungs clear without rales or wheezes Abdomen soft without tenderness palpation or guarding, no ascites or distention Attention normal, affect appropriate, moves all extremities with normal strength and coordination, face symmetric, speech fluent  Data Reviewed: Cardiology notes reviewed, echocardiogram report reviewed, nursing notes reviewed. Basic metabolic panel reviewed, creatinine and electrolytes are normal.  Family Communication: Niece and nephew at the bedside    Disposition: Status is: Inpatient         Author: Alberteen Sam, MD 07/05/2021 5:09 PM  For on call review [email or web address].

## 2021-07-05 NOTE — TOC Initial Note (Signed)
Transition of Care [facility]) - Initial/Assessment Note    Patient Details  Name: Nicole Parker MRN: [id number] Date of Birth: Aug 06, 1980  [facility] CM/SW Contact:    Nicole Busing, RN Phone Number:[phone]  07/05/2021, 2:51 PM  Clinical Narrative:                 [facility] consulted for heart failure screen and PCP.  Spanish interpreter Nicole Parker [phone] used to communicate with Spanish speaking patient via interpreter ipad at bedside. Per patient she is from home with her spouse and 3 children where she functions independently with no needs. Patient states that she has never been told that she has CHF and she does not see a cardiologist. Patient reports that she goes to [facility] in [place] and this is how she gets her scripts filled for her medications. Patient does report that she uses Psychologist, forensic on [address]. Patient reports that she does have transportation. TOC will follow for disposition needs  and set up PCP once patient is ready for d/c.   Expected Discharge Plan: Home/Self Care Barriers to Discharge: Continued Medical Work up   Patient Goals and CMS Choice Patient states their goals for this hospitalization and ongoing recovery are:: To feel better CMS Medicare.gov Compare Post Acute Care list provided to::  (n/a) Choice offered to / list presented to : NA  Expected Discharge Plan and Services Expected Discharge Plan: Home/Self Care In-house Referral: NA Discharge Planning Services: CM Consult Post Acute Care Choice: NA Living arrangements for the past 2 months: Mobile Home                 DME Arranged: N/A DME Agency: NA       HH Arranged: NA HH Agency: NA        Prior Living Arrangements/Services Living arrangements for the past 2 months: Mobile Home Lives with:: Minor Children, Spouse Patient language and need for interpreter reviewed:: Yes (Spanish) Do you feel safe going back to the place where you live?: Yes      Need for  Family Participation in Patient Care: No (Comment) Care giver support system in place?: Yes (comment)   Criminal Activity/Legal Involvement Pertinent to Current Situation/Hospitalization: No - Comment as needed  Activities of Daily Living Home Assistive Devices/Equipment: None ADL Screening (condition at time of admission) Patient's cognitive ability adequate to safely complete daily activities?: No Is the patient deaf or have difficulty hearing?: No Does the patient have difficulty seeing, even when wearing glasses/contacts?: No Does the patient have difficulty concentrating, remembering, or making decisions?: No Patient able to express need for assistance with ADLs?: Yes Does the patient have difficulty dressing or bathing?: No Independently performs ADLs?: Yes (appropriate for developmental age) Does the patient have difficulty walking or climbing stairs?: No Weakness of Legs: None Weakness of Arms/Hands: None  Permission Sought/Granted Permission sought to share information with : Family Supports Permission granted to share information with : No              Emotional Assessment Appearance:: Appears stated age Attitude/Demeanor/Rapport: Gracious, Engaged Affect (typically observed): Accepting, Pleasant Orientation: : Oriented to Self, Oriented to Place, Oriented to  Time, Oriented to Situation Alcohol / Substance Use: Not Applicable Psych Involvement: No (comment)  Admission diagnosis:  Hypertensive crisis [I16.9] Hypomagnesemia [E83.42] Poorly controlled diabetes mellitus (HCC) [E11.65] NSVT (nonsustained ventricular tachycardia) (HCC) [I47.29] Hypertensive emergency [I16.1] Acute congestive heart failure, unspecified heart failure type [facility]) [I50.9] Patient Active Problem List  Diagnosis Date Noted   Hypertensive emergency 07/03/2021   NSVT (nonsustained ventricular tachycardia) (HCC) 07/03/2021   Hypokalemia/hypomagnesia  07/03/2021   Elevated d-dimer 07/03/2021    Cushing phenomenon 07/03/2021   Iron deficiency anemia 04/09/2019   Type 2 diabetes mellitus without complication, without long-term current use of insulin (HCC) 04/07/2019   Class 3 severe obesity due to excess calories with serious comorbidity and body mass index (BMI) of 40.0 to 44.9 in adult (HCC) 04/05/2019   PCP:  Pcp, No Pharmacy:   [facility] [address] [place] - [address] [place] [address] Phone: [phone] Fax: [phone]  [facility] at [facility] [address] [place] [address] Phone: [phone] Fax: [phone]  [facility] at [facility] [address] [place] [address] Phone: [phone] Fax: [phone]  [facility] [address] [place] [address] Phone: [phone] Fax: [phone]     Social Determinants of Health (SDOH) Interventions Food Insecurity Interventions: Intervention Not Indicated Financial Strain Interventions: Intervention Not Indicated Housing Interventions: Intervention Not Indicated Transportation Interventions: Intervention Not Indicated  Readmission Risk Interventions     View : No data to display.

## 2021-07-05 NOTE — Plan of Care (Signed)
Discussed with patient and husband plan of care for the evening, pain management and importance of signs and symptoms with medications with some teach back displayed  Problem: Education: Goal: Knowledge of General Education information will improve Description: Including pain rating scale, medication(s)/side effects and non-pharmacologic comfort measures Outcome: Progressing

## 2021-07-06 ENCOUNTER — Inpatient Hospital Stay (HOSPITAL_COMMUNITY): Payer: Self-pay

## 2021-07-06 ENCOUNTER — Other Ambulatory Visit (HOSPITAL_COMMUNITY): Payer: Self-pay

## 2021-07-06 DIAGNOSIS — I5043 Acute on chronic combined systolic (congestive) and diastolic (congestive) heart failure: Secondary | ICD-10-CM

## 2021-07-06 DIAGNOSIS — K760 Fatty (change of) liver, not elsewhere classified: Secondary | ICD-10-CM

## 2021-07-06 DIAGNOSIS — I10 Essential (primary) hypertension: Secondary | ICD-10-CM

## 2021-07-06 LAB — BASIC METABOLIC PANEL
Anion gap: 5 (ref 5–15)
BUN: 21 mg/dL — ABNORMAL HIGH (ref 6–20)
CO2: 23 mmol/L (ref 22–32)
Calcium: 8.7 mg/dL — ABNORMAL LOW (ref 8.9–10.3)
Chloride: 107 mmol/L (ref 98–111)
Creatinine, Ser: 0.79 mg/dL (ref 0.44–1.00)
GFR, Estimated: >60 mL/min (ref >60)
Glucose, Bld: 157 mg/dL — ABNORMAL HIGH (ref 70–99)
Potassium: 4.1 mmol/L (ref 3.5–5.1)
Sodium: 135 mmol/L (ref 135–145)

## 2021-07-06 LAB — GLUCOSE, CAPILLARY
Glucose-Capillary: 164 mg/dL — ABNORMAL HIGH (ref 70–99)
Glucose-Capillary: 160 mg/dL — ABNORMAL HIGH (ref 70–99)
Glucose-Capillary: 161 mg/dL — ABNORMAL HIGH (ref 70–99)

## 2021-07-06 MED ORDER — METOPROLOL TARTRATE 5 MG/5ML IV SOLN
5.0000 mg | Freq: Once | INTRAVENOUS | Status: AC
  Administered 2021-07-06: 5 mg via INTRAVENOUS

## 2021-07-06 MED ORDER — FUROSEMIDE 20 MG PO TABS
20.0000 mg | ORAL_TABLET | Freq: Every day | ORAL | 6 refills | Status: DC
  Filled 2021-07-06: qty 30, 30d supply, fill #0

## 2021-07-06 MED ORDER — DILTIAZEM HCL 25 MG/5ML IV SOLN: 5.0000 mg | Freq: Once | INTRAVENOUS | Status: AC

## 2021-07-06 MED ORDER — METOPROLOL TARTRATE 5 MG/5ML IV SOLN
INTRAVENOUS | Status: AC
  Administered 2021-07-06: 5 mg via INTRAVENOUS
  Filled 2021-07-06: qty 10

## 2021-07-06 MED ORDER — LOSARTAN POTASSIUM 25 MG PO TABS
25.0000 mg | ORAL_TABLET | Freq: Every day | ORAL | 6 refills | Status: DC
  Filled 2021-07-06: qty 30, 30d supply, fill #0

## 2021-07-06 MED ORDER — NITROGLYCERIN 0.4 MG SL SUBL
SUBLINGUAL_TABLET | SUBLINGUAL | Status: AC
  Administered 2021-07-06: 0.8 mg via SUBLINGUAL
  Filled 2021-07-06: qty 2

## 2021-07-06 MED ORDER — NITROGLYCERIN 0.4 MG SL SUBL: 0.8000 mg | SUBLINGUAL_TABLET | Freq: Once | SUBLINGUAL | Status: AC

## 2021-07-06 MED ORDER — SPIRONOLACTONE 25 MG PO TABS
25.0000 mg | ORAL_TABLET | Freq: Every day | ORAL | 6 refills | Status: DC
  Filled 2021-07-06: qty 30, 30d supply, fill #0

## 2021-07-06 MED ORDER — IOHEXOL 350 MG/ML SOLN
100.0000 mL | Freq: Once | INTRAVENOUS | Status: AC | PRN
  Administered 2021-07-06: 100 mL via INTRAVENOUS

## 2021-07-06 MED ORDER — METOPROLOL TARTRATE 5 MG/5ML IV SOLN
5.0000 mg | Freq: Once | INTRAVENOUS | Status: AC
Start: 2021-07-06 — End: 2021-07-06

## 2021-07-06 MED ORDER — CARVEDILOL 6.25 MG PO TABS
6.2500 mg | ORAL_TABLET | Freq: Two times a day (BID) | ORAL | Status: DC
  Administered 2021-07-06 (×2): 6.25 mg via ORAL
  Filled 2021-07-06 (×2): qty 1

## 2021-07-06 MED ORDER — CARVEDILOL 6.25 MG PO TABS
6.2500 mg | ORAL_TABLET | Freq: Two times a day (BID) | ORAL | 6 refills | Status: DC
  Filled 2021-07-06: qty 60, 30d supply, fill #0

## 2021-07-06 MED ORDER — POTASSIUM CHLORIDE CRYS ER 10 MEQ PO TBCR
10.0000 meq | EXTENDED_RELEASE_TABLET | Freq: Every day | ORAL | 6 refills | Status: DC
  Filled 2021-07-06: qty 30, 30d supply, fill #0

## 2021-07-06 MED ORDER — METFORMIN HCL 500 MG PO TABS
1000.0000 mg | ORAL_TABLET | Freq: Two times a day (BID) | ORAL | 3 refills | Status: DC
  Filled 2021-07-06: qty 120, 30d supply, fill #0

## 2021-07-06 MED ORDER — DILTIAZEM HCL 25 MG/5ML IV SOLN
INTRAVENOUS | Status: AC
  Administered 2021-07-06: 5 mg via INTRAVENOUS
  Filled 2021-07-06: qty 5

## 2021-07-06 NOTE — Assessment & Plan Note (Signed)
Seen incidentally on CT chest.

## 2021-07-06 NOTE — Progress Notes (Signed)
Progress Note  Patient Name: Nicole Parker Date of Encounter: 07/06/2021  [facility] Cardiologist: None   Subjective   She feels well and denies dyspnea at rest or walking to the bathroom, orthopnea, headaches, chest pain. Renal duplex ultrasound does not show evidence of renal artery stenosis.  Coronary CT angio pending later today. Blood pressure remains moderately elevated, but is substantially improved since admission. Dexamethasone suppression test negative for Cushing's syndrome.  Inpatient Medications    Scheduled Meds:  carvedilol  6.25 mg Oral BID WC   enoxaparin (LOVENOX) injection  40 mg Subcutaneous Q24H   furosemide  20 mg Oral Daily   insulin aspart  0-15 Units Subcutaneous TID WC   losartan  25 mg Oral Daily   potassium chloride  40 mEq Oral BID   sodium chloride flush  3 mL Intravenous Q12H   spironolactone  12.5 mg Oral Daily   Continuous Infusions:  sodium chloride     PRN Meds: sodium chloride, acetaminophen **OR** acetaminophen, hydrALAZINE, sodium chloride flush   Vital Signs    Vitals:   07/06/21 0436 07/06/21 0733 07/06/21 0940 07/06/21 1149  BP: 137/89 (!) 153/90 (!) 153/90 (!) 153/91  Pulse: 73 69 87 71  Resp: 20 20  15   Temp: 97.7 F (36.5 C) 98 F (36.7 C)  98.5 F (36.9 C)  TempSrc: Oral Oral  Oral  SpO2: 97% 98%  94%  Weight: 105.3 kg 108.7 kg    Height:        Intake/Output Summary (Last 24 hours) at 07/06/2021 1206 Last data filed at 07/06/2021 0400 Gross per 24 hour  Intake 190 ml  Output --  Net 190 ml      07/06/2021    7:33 AM 07/06/2021    4:36 AM 07/05/2021    5:00 AM  Last 3 Weights  Weight (lbs) 239 lb 10.2 oz 232 lb 1.6 oz 234 lb 5.6 oz  Weight (kg) 108.7 kg 105.28 kg 106.3 kg      Telemetry    Sinus rhythm with occasional PVCs- Personally Reviewed  ECG    No new tracing- Personally Reviewed  Physical Exam  Severely obese, appears comfortable lying fully supine in bed GEN: No acute distress.    Neck: No JVD Cardiac: RRR occasional ectopy, no murmurs, rubs, or gallops.  Respiratory: Clear to auscultation bilaterally. GI: Soft, nontender, non-distended  MS: No edema; No deformity. Neuro:  Nonfocal  Psych: Normal affect   Labs    High Sensitivity Troponin:   Recent Labs  Lab 07/03/21 0624 07/03/21 0751  TROPONINIHS 8 6     Chemistry Recent Labs  Lab 07/03/21 0641 07/03/21 0751 07/04/21 0048 07/05/21 0124 07/06/21 0305  NA  --   --  134* 137 135  K  --   --  3.4* 3.8 4.1  CL  --   --  104 106 107  CO2  --   --  25 24 23   GLUCOSE  --   --  142* 151* 157*  BUN  --   --  9 15 21*  CREATININE  --   --  0.58 0.63 0.79  CALCIUM  --   --  8.4* 8.6* 8.7*  MG  --  1.6* 2.0  --   --   PROT 7.5  --   --   --   --   ALBUMIN 4.0  --   --   --   --   AST 24  --   --   --   --  ALT 26  --   --   --   --   ALKPHOS 67  --   --   --   --   BILITOT 0.5  --   --   --   --   GFRNONAA  --   --  >60 >60 >60  ANIONGAP  --   --  5 7 5     Lipids  Recent Labs  Lab 07/04/21 0048  CHOL 184  TRIG 255*  HDL 35*  LDLCALC 98  CHOLHDL 5.3    Hematology Recent Labs  Lab 07/03/21 0624 07/04/21 0048  WBC 8.2 8.2  RBC 4.93 4.97  HGB 13.5 13.8  HCT 40.2 40.6  MCV 81.5 81.7  MCH 27.4 27.8  MCHC 33.6 34.0  RDW 13.0 13.0  PLT 283 269   Thyroid  Recent Labs  Lab 07/03/21 1833  TSH 2.005    BNP Recent Labs  Lab 07/03/21 0641  BNP 105.8*    DDimer  Recent Labs  Lab 07/03/21 0735  DDIMER 0.64*     Radiology    ECHOCARDIOGRAM COMPLETE  Result Date: 07/04/2021    ECHOCARDIOGRAM REPORT   Patient Name:   Nicole Parker Date of Exam: 07/04/2021 Medical Rec #:  [id number]              Height:       65.0 in Accession #:    [id number]             Weight:       235.0 lb Date of Birth:  [birth year]               BSA:          2.119 m Patient Age:    41 years               BP:           151/88 mmHg Patient Gender: F                      HR:           79 bpm. Exam  Location:  Inpatient Procedure: 2D Echo, 3D Echo, Cardiac Doppler, Color Doppler and Strain Analysis Indications:    CHF  History:        Patient has no prior history of Echocardiogram examinations.                 Risk Factors:Diabetes and Hypertension.  Sonographer:    Luisa Hart RDCS Referring Phys: [id number] [place]  1. Global hypokinesis worse in the anterolateral myocardium. Left ventricular ejection fraction, by estimation, is 40 to 45%. The left ventricle has mildly decreased function. The left ventricle demonstrates regional wall motion abnormalities (see scoring diagram/findings for description). There is moderate concentric left ventricular hypertrophy. Left ventricular diastolic parameters are consistent with Grade II diastolic dysfunction (pseudonormalization). Elevated left ventricular end-diastolic pressure. The average left ventricular global longitudinal strain is -11.7 %. The global longitudinal strain is abnormal.  2. Right ventricular systolic function is normal. The right ventricular size is normal. There is normal pulmonary artery systolic pressure.  3. The mitral valve is normal in structure. No evidence of mitral valve regurgitation. No evidence of mitral stenosis.  4. The aortic valve was not well visualized. Aortic valve regurgitation is not visualized. No aortic stenosis is present.  5. Aortic dilatation noted. There is mild dilatation of the aortic root, measuring 37 mm.  6. The  inferior vena cava is normal in size with greater than 50% respiratory variability, suggesting right atrial pressure of 3 mmHg. FINDINGS  Left Ventricle: Global hypokinesis worse in the anterolateral myocardium. Left ventricular ejection fraction, by estimation, is 40 to 45%. The left ventricle has mildly decreased function. The left ventricle demonstrates regional wall motion abnormalities. The average left ventricular global longitudinal strain is -11.7 %. The global longitudinal strain is  abnormal. The left ventricular internal cavity size was normal in size. There is moderate concentric left ventricular hypertrophy. Left ventricular diastolic parameters are consistent with Grade II diastolic dysfunction (pseudonormalization). Elevated left ventricular end-diastolic pressure. Right Ventricle: The right ventricular size is normal. No increase in right ventricular wall thickness. Right ventricular systolic function is normal. There is normal pulmonary artery systolic pressure. The tricuspid regurgitant velocity is 2.29 m/s, and  with an assumed right atrial pressure of 3 mmHg, the estimated right ventricular systolic pressure is 0000000 mmHg. Left Atrium: Left atrial size was normal in size. Right Atrium: Right atrial size was normal in size. Pericardium: There is no evidence of pericardial effusion. Mitral Valve: The mitral valve is normal in structure. No evidence of mitral valve regurgitation. No evidence of mitral valve stenosis. Tricuspid Valve: The tricuspid valve is normal in structure. Tricuspid valve regurgitation is trivial. No evidence of tricuspid stenosis. Aortic Valve: The aortic valve was not well visualized. Aortic valve regurgitation is not visualized. No aortic stenosis is present. Aortic valve mean gradient measures 5.0 mmHg. Aortic valve peak gradient measures 8.9 mmHg. Aortic valve area, by VTI measures 2.41 cm. Pulmonic Valve: The pulmonic valve was normal in structure. Pulmonic valve regurgitation is not visualized. No evidence of pulmonic stenosis. Aorta: Aortic dilatation noted. There is mild dilatation of the aortic root, measuring 37 mm. Venous: The inferior vena cava is normal in size with greater than 50% respiratory variability, suggesting right atrial pressure of 3 mmHg. IAS/Shunts: No atrial level shunt detected by color flow Doppler.  LEFT VENTRICLE PLAX 2D LVIDd:         5.30 cm     Diastology LVIDs:         4.10 cm     LV e' medial:    4.78 cm/s LV PW:         1.30 cm      LV E/e' medial:  19.7 LV IVS:        1.40 cm     LV e' lateral:   4.54 cm/s LVOT diam:     2.30 cm     LV E/e' lateral: 20.8 LV SV:         65 LV SV Index:   31          2D Longitudinal Strain LVOT Area:     4.15 cm    2D Strain GLS (A2C):   -8.1 %                            2D Strain GLS (A3C):   -13.4 %                            2D Strain GLS (A4C):   -13.8 % LV Volumes (MOD)           2D Strain GLS Avg:     -11.7 % LV vol d, MOD A2C: 76.8 ml LV vol d, MOD A4C: 82.9 ml LV vol s, MOD  A2C: 47.5 ml LV vol s, MOD A4C: 49.7 ml LV SV MOD A2C:     29.3 ml LV SV MOD A4C:     82.9 ml LV SV MOD BP:      33.3 ml RIGHT VENTRICLE RV Basal diam:  3.90 cm RV Mid diam:    1.60 cm RV S prime:     12.30 cm/s TAPSE (M-mode): 1.8 cm LEFT ATRIUM           Index        RIGHT ATRIUM           Index LA diam:      3.00 cm 1.42 cm/m   RA Area:     14.90 cm LA Vol (A2C): 61.5 ml 29.03 ml/m  RA Volume:   33.40 ml  15.77 ml/m LA Vol (A4C): 51.4 ml 24.26 ml/m  AORTIC VALVE                     PULMONIC VALVE AV Area (Vmax):    2.36 cm      PV Vmax:       0.82 m/s AV Area (Vmean):   2.24 cm      PV Vmean:      63.200 cm/s AV Area (VTI):     2.41 cm      PV VTI:        0.218 m AV Vmax:           149.00 cm/s   PV Peak grad:  2.7 mmHg AV Vmean:          101.000 cm/s  PV Mean grad:  2.0 mmHg AV VTI:            0.269 m AV Peak Grad:      8.9 mmHg AV Mean Grad:      5.0 mmHg LVOT Vmax:         84.80 cm/s LVOT Vmean:        54.400 cm/s LVOT VTI:          0.156 m LVOT/AV VTI ratio: 0.58  AORTA Ao Root diam: 3.70 cm Ao Asc diam:  3.70 cm MITRAL VALVE               TRICUSPID VALVE MV Area (PHT): 4.21 cm    TR Peak grad:   21.0 mmHg MV Decel Time: 180 msec    TR Vmax:        229.00 cm/s MV E velocity: 94.30 cm/s MV A velocity: 67.30 cm/s  SHUNTS MV E/A ratio:  1.40        Systemic VTI:  0.16 m                            Systemic Diam: 2.30 cm Skeet Latch MD Electronically signed by Skeet Latch MD Signature Date/Time:  07/04/2021/4:03:58 PM    Final    VAS US RENAL ARTERY DUPLEX  Result Date: 07/06/2021 ABDOMINAL VISCERAL Patient Name:  Nicole Parker  Date of Exam:   07/06/2021 Medical Rec #: [id number]               Accession #:    [id number] Date of Birth: May 05, 1980                Patient Gender: F Patient Age:   52 years Exam Location:  [facility] Procedure:      VAS US RENAL ARTERY DUPLEX Referring Phys:  [id number] Margie Billet -------------------------------------------------------------------------------- Indications: Hypertension Limitations: Obesity. Comparison Study: No prior studies. Performing Technologist: Darlin Coco RDMS, RVT  Examination Guidelines: A complete evaluation includes B-mode imaging, spectral Doppler, color Doppler, and power Doppler as needed of all accessible portions of each vessel. Bilateral testing is considered an integral part of a complete examination. Limited examinations for reoccurring indications may be performed as noted.  Duplex Findings: +----------------------+--------+--------+------+--------+ Mesenteric            PSV cm/sEDV cm/sPlaqueComments +----------------------+--------+--------+------+--------+ Aorta Mid                85                          +----------------------+--------+--------+------+--------+ Celiac Artery Proximal  118                          +----------------------+--------+--------+------+--------+ SMA Proximal            150                          +----------------------+--------+--------+------+--------+    +------------------+--------+--------+-------+ Right Renal ArteryPSV cm/sEDV cm/sComment +------------------+--------+--------+-------+ Origin               76      28           +------------------+--------+--------+-------+ Proximal             85      39           +------------------+--------+--------+-------+ Mid                  83      25            +------------------+--------+--------+-------+ Distal               83      23           +------------------+--------+--------+-------+ +-----------------+--------+--------+-------+ Left Renal ArteryPSV cm/sEDV cm/sComment +-----------------+--------+--------+-------+ Origin             105      44           +-----------------+--------+--------+-------+ Proximal            96      34           +-----------------+--------+--------+-------+ Mid                103      34           +-----------------+--------+--------+-------+ Distal             128      47           +-----------------+--------+--------+-------+ +------------+--------+--------+----+-----------+--------+--------+----+ Right KidneyPSV cm/sEDV cm/sRI  Left KidneyPSV cm/sEDV cm/sRI   +------------+--------+--------+----+-----------+--------+--------+----+ Upper Pole  26      9       0.63Upper Pole 29      13      0.56 +------------+--------+--------+----+-----------+--------+--------+----+ Mid         28      8       0.71Mid        34      11      0.68 +------------+--------+--------+----+-----------+--------+--------+----+ Lower Pole  23      11      0.51Lower Pole 19      8       0.60 +------------+--------+--------+----+-----------+--------+--------+----+ Hilar  45      18      0.60Hilar      53      18      0.66 +------------+--------+--------+----+-----------+--------+--------+----+ +------------------+-----+------------------+-----+ Right Kidney           Left Kidney             +------------------+-----+------------------+-----+ RAR                    RAR                     +------------------+-----+------------------+-----+ RAR (manual)      1.00 RAR (manual)      1.51  +------------------+-----+------------------+-----+ Cortex                 Cortex                  +------------------+-----+------------------+-----+ Cortex thickness       Corex  thickness         +------------------+-----+------------------+-----+ Kidney length (cm)12.80Kidney length (cm)13.10 +------------------+-----+------------------+-----+   Summary: Renal:  Right: No evidence of right renal artery stenosis. Normal right        Resisitive Index. RRV flow present. Left:  No evidence of left renal artery stenosis. Normal left        Resistive Index. LRV flow present. Mesenteric: Normal Celiac artery and Superior Mesenteric artery findings.  *See table(s) above for measurements and observations.     Preliminary     Cardiac Studies   Echo 07/04/2021  1. Global hypokinesis worse in the anterolateral myocardium. Left ventricular ejection fraction, by estimation, is 40 to 45%. The left ventricle has mildly decreased function. The left ventricle demonstrates regional wall motion abnormalities (see scoring diagram/findings for description). There is moderate concentric left ventricular hypertrophy. Left ventricular diastolic parameters are consistent with Grade II diastolic dysfunction (pseudonormalization). Elevated left ventricular end-diastolic pressure. The average left ventricular global longitudinal strain is -11.7 %. The global longitudinal strain is abnormal.   2. Right ventricular systolic function is normal. The right ventricular size is normal. There is normal pulmonary artery systolic pressure.   3. The mitral valve is normal in structure. No evidence of mitral valve regurgitation. No evidence of mitral stenosis.   4. The aortic valve was not well visualized. Aortic valve regurgitation is not visualized. No aortic stenosis is present.   5. Aortic dilatation noted. There is mild dilatation of the aortic root, measuring 37 mm.   6. The inferior vena cava is normal in size with greater than 50% respiratory variability, suggesting right atrial pressure of 3 mmHg.    Preliminary renal duplex ultrasound result today  Renal:     Right: No evidence of right renal  artery stenosis. Normal right  Resisitive Index. RRV flow present.  Left:  No evidence of left renal artery stenosis. Normal left  Resistive Index. LRV flow present.  Mesenteric: Normal Celiac artery and Superior Mesenteric artery findings.  Patient Profile     41 y.o. female presenting with severe hypertension and newly diagnosed heart failure with reduced LVEF, hypokalemia, after stopping all antihypertensive medications.  Additional problems include severe obesity, type 2 diabetes mellitus and financial/insurance limitations.  Assessment & Plan    We will follow-up after coronary CTA later today.   Blood pressure improved, although not yet at target. Very hopeful that she will not require Entresto in the long run and that we will see substantial improvement in LVEF just with good blood pressure  control. Continue to titrate up the doses of carvedilol, losartan, spironolactone. We will try to avoid expensive medication such as Entresto and SGLT2 inhibitors for now, reevaluate LVEF response to treatment of hypertension in several weeks.  For questions or updates, please contact [place] Please consult [email or web address] for contact info under        Signed, Sanda Klein, MD  07/06/2021, 12:06 PM

## 2021-07-06 NOTE — Progress Notes (Signed)
Renal artery duplex study completed.   Please see CV Proc for preliminary results.   Natassia Guthridge, RDMS, RVT  

## 2021-07-06 NOTE — Progress Notes (Signed)
Heart Failure Stewardship Pharmacist Progress Note   PCP: Pcp, No PCP-Cardiologist: None    HPI:  41 yo F with PMH of HTN and diabetes who stopped medication due to side effects. She presented to the ED on /30 with chest pressure, shortness of breath, orthopnea, and nausea. Had an episode of NSVT in the ED. CXR suggestive of CHF. CTA negative for PE with evidence of interstitial pulmonary edema. An ECHO was done on 5/31 and LVEF was 40-45% with moderate LVH and G2DD. She received 2 doses of IV lasix and has been transitioned to oral.  Current HF Medications: Diuretic: furosemide 20 mg PO daily Beta Blocker: carvedilol 6.25 mg BID ACE/ARB/ARNI: losartan 25 mg daily Aldosterone Antagonist: spironolactone 12.5 mg daily Other: KCl 40 mEq BID  Prior to admission HF Medications: None  Pertinent Lab Values: Serum creatinine 0.79, BUN 21, Potassium 4.1, Sodium 135, BNP 105.8, Magnesium 2.0, A1c 8.0  Vital Signs: Weight: 239 lbs (admission weight: 237 lbs) Blood pressure: 130-150/90s  Heart rate: 70s  I/O: not recorded  Medication Assistance / Insurance Benefits Check: Does the patient have prescription insurance?  No  Does the patient qualify for medication assistance through manufacturers or grants?   Pending Eligible grants and/or patient assistance programs: pending Medication assistance applications in progress: none  Medication assistance applications approved: none Approved medication assistance renewals will be completed by: pending  Outpatient Pharmacy:  Prior to admission outpatient pharmacy: [facility] Is the patient willing to use [facility] pharmacy at discharge? Yes Is the patient willing to transition their outpatient pharmacy to utilize a [facility] outpatient pharmacy?   Pending    Assessment: 1. 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 NYHA class II symptoms. Cardiology consulted - coronary CTA ordered for ischemic evaluation.  - Consider resuming IV lasix if  remains volume overloaded - weight up 2 lbs from admission - KCl 40 mEq BID ordered - keep K >4 and mag >2 - Continue carvedilol 6.25 mg BID - Continue losartan 25 mg daily - consider optimizing to Entresto 24/26 mg BID. Can obtain patient assistance so this is not costly for her. - Continue spironolactone 12.5 mg daily - Consider adding Farxiga 10 mg daily prior to discharge   Plan: 1) Medication changes recommended at this time: - Stop losartan - Start Entresto 24/26 mg BID - Start Farxiga 10 mg daily  2) Patient assistance: - Patient is uninsured - will need MATCH and patient assistance through manufacturer as meds are added  3)  Education  - To be completed prior to discharge  Kerby Nora, PharmD, BCPS Heart Failure Cytogeneticist Phone [phone]

## 2021-07-06 NOTE — Discharge Summary (Signed)
Physician Discharge Summary   Patient: Nicole Parker MRN: [id number] DOB: 05-14-1980  Admit date:     07/03/2021  Discharge date: 07/06/21  Discharge Physician: Edwin Dada   PCP: Fenton Foy, NP     Recommendations at discharge:  Follow up with [facility] for CHF care in 1 week Follow up with Lazaro Arms new PCP in 1 week [facility]: Please check BMP in 1 week Please titrate BP medications slowly given patient past experience Lazaro Arms: Please obtain CT chest in 1 year to monitor thoracic aortic aneurysm Please follow up Plasma Renin Aldosterone Ratio        Discharge Diagnoses: Principal Problem:   Acute on chronic combined systolic and diastolic CHF (congestive heart failure) ([place]) Active Problems:   Hypertensive emergency   Class 3 severe obesity due to excess calories with serious comorbidity and body mass index (BMI) of 40.0 to 44.9 in adult [facility])   Type 2 diabetes mellitus without complication, without long-term current use of insulin (HCC)   NSVT (nonsustained ventricular tachycardia) (HCC)   Hypokalemia/hypomagnesia    Cushing's-like features    Hyponatremia   Thoracic aortic aneurysm [facility])   Nonalcoholic fatty liver      Hospital Course: Nicole Parker is a 41 y.o. F with untreated HTN and DM who presented with shortness of breath and chest pain.   BP 218/132 on admission and had clinical CHF and chest pain.         * Acute on chronic combined systolic and diastolic CHF (congestive heart failure) ([place]) Presented with dyspnea on exertion and imaging showing edema and effusions.  She was treated with IV Lasix and her symptoms resolved, she had no orthopnea or dyspnea on exertion.  Echo showed new EF 40-45% with grade II DD and RWMA anterolaterally and normal valves.  Cardiology were consulted and performed renal ultrasound to rule out renal artery stenosis, and CT coronary angiogram that ruled out  atherosclerosis.  Amlodipine was stopped and she was started on Coreg, losartan, spironolactone, and furosemide.  Follow-up with cardiology was arranged.     Hypertensive emergency Presented with BP >210/130 and symptoms of CHF.    Blood pressure was lower gradually initially, and then she was started on Coreg, spironolactone, losartan, and furosemide, blood pressure near goal but will need titration.  Renal artery [place] normal.  PRA pending.  Note: She previously had adverse effects from lisinopril and losartan which she feels was due to starting these at relatively high doses.  She has expressed preference for slow up-titration of medications if possible.    Nonalcoholic fatty liver Seen incidentally on CT chest.  Thoracic aortic aneurysm ([place]) Incidental finding.  4.1cm on CTA.   - Outpatient follow up in 1 year  Cushing's like features  There was some initial concern for Cushingoid features.  Overnight dexamethasone suppression was normal, I think Cushings fairly unlikely.  Not unreasonable to check salivary cortisol in the future if BP were difficult to control or other Cushing's features developed (hump, bruising, fatigue).               The [facility] Controlled Substances Registry was reviewed for this patient prior to discharge.  Consultants: Cardiology Procedures performed: Echo, CT Coronary Angiogram, Renal artery [place]  Disposition: Home Diet recommendation:  Discharge Diet Orders (From admission, onward)     Start     Ordered   07/06/21 0000  Diet - low sodium heart healthy  07/06/21 1630             DISCHARGE MEDICATION: Allergies as of 07/06/2021   No Known Allergies      Medication List     STOP taking these medications    amLODipine 5 MG tablet Commonly known as: NORVASC   ferrous sulfate 325 (65 FE) MG tablet       TAKE these medications    carvedilol 6.25 MG tablet Commonly known as: COREG Take 1 tablet (6.25 mg total)  by mouth 2 (two) times daily with a meal.   furosemide 20 MG tablet Commonly known as: LASIX Tome 1 tableta (20 mg en total) por va oral diariamente. (Take 1 tablet (20 mg total) by mouth daily.) Start taking on: July 07, 2021   losartan 25 MG tablet Commonly known as: COZAAR Tome 1 tableta (25 mg en total) por va oral diariamente. (Take 1 tablet (25 mg total) by mouth daily.) Start taking on: July 07, 2021   metFORMIN 500 MG tablet Commonly known as: GLUCOPHAGE Take 2 tablets (1,000 mg total) by mouth 2 (two) times daily with a meal. What changed:  how much to take when to take this   potassium chloride 10 MEQ tablet Commonly known as: KLOR-CON M Take 1 tablet (10 mEq total) by mouth at bedtime.   spironolactone 25 MG tablet Commonly known as: ALDACTONE Tome 1 tableta (25 mg en total) por va oral diariamente. (Take 1 tablet (25 mg total) by mouth daily.) Start taking on: July 07, 2021         Discharge Instructions     Diet - low sodium heart healthy   Complete by: As directed    Discharge instructions   Complete by: As directed    From Dr. Loleta Books: You were admitted for trouble breathing and chest discomfort Here, we found that your lung imaging showed signs of congestive heart failure. When we did an ultrasound of your heart (an "echo"), we saw that your heart squeeze was weak.  We did testing of your heart with a special CAT scan called a CT coronary angiogram that showed you had no blockages or heart attacks.  This suggests to [place] that your heart damage was from high blood pressure.  We also did testing to see if you had an unusual condition that causes extra high blood pressure, and this test was normal.   For now, you should take the following blood pressure medicines: Carvedilol/Coreg 6.25 mg twice daily This is a quarter dose, and can be increased later  Losartan/Cozaar 25 mg daily  This is also a quarter dose  Spironolactone/Aldactone 25 mg once  daily This is a full dose, but it is a medicine that is least likely to cause side effects of all your new medicines  Furosemide/Lasix 20 mg daily This is also a small dose Furosemide is a "diuretic" that makes you pee out water.  It also makes you pee out potassium, so you should take the potassium supplement Klor-Con daily in the evening with your evening dose of Coreg   You have 2 follow up appointments with Cardiology: 1. on June 14th at [facility] (this is the clinic downstairs [facility] at [facility] that she told you about) 2. on June 28 at 9:15AM at the General Cardiology clinic in [facility] on [address] with Caron Presume  Details of these appointments (and a number to call if you have to change the date or time) is  below in the "To Do" section  They are very important appointments for you to go to.   For your other health questions (diabetes, etc.) you should go to a new primary care clinic. We have set you up with an appointment at the [facility] which is part of [place] near [facility] This is with Lazaro Arms at [facility] on June 8th  I also see an appointment below with someone named Jacolyn Reedy on June 16th at [facility] I am not sure who scheduled this appointment, but if you do not recognize it, or you didn't schedule it yourself before you came to the hospital, you may call and cancel it.  If it turns out that you would still like to find another clinic, two that I recommend are: [facility]    For the metformin, this will cause diarrhea if you start it too fast. For that reason, I recommend you take metformin 500 mg once dialy for a week, then metformin 500 mg twice dialy for a week, then 1000 mg twice daily eventually.   Increase activity slowly   Complete by: As directed        Discharge Exam: Filed Weights   07/05/21 0500 07/06/21 0436  07/06/21 0733  Weight: 106.3 kg 105.3 kg 108.7 kg    General: Pt is alert, awake, not in acute distress Cardiovascular: RRR, nl S1-S2, no murmurs appreciated.   No LE edema.   Respiratory: Normal respiratory rate and rhythm.  CTAB without rales or wheezes. Abdominal: Abdomen soft and non-tender.  No distension or HSM.   Neuro/Psych: Strength symmetric in upper and lower extremities.  Judgment and insight appear normal.   Condition at discharge: Good  The results of significant diagnostics from this hospitalization (including imaging, microbiology, ancillary and laboratory) are listed below for reference.   Imaging Studies: DG Chest 2 View  Result Date: 07/03/2021 CLINICAL DATA:  41 year old female with history of central chest pressure and shortness of breath. Nausea. EXAM: CHEST - 2 VIEW COMPARISON:  No priors. FINDINGS: There is cephalization of the pulmonary vasculature and slight indistinctness of the interstitial markings suggestive of mild pulmonary edema. Trace bilateral pleural effusions. No pneumothorax. Heart size is mildly enlarged. Upper mediastinal contours are within normal limits. IMPRESSION: 1. The appearance the chest is concerning for developing congestive heart failure, as above. Electronically Signed   By: Vinnie Langton M.D.   On: 07/03/2021 06:27   CT Angio Chest PE W and/or Wo Contrast  Result Date: 07/03/2021 CLINICAL DATA:  41 year old female with history of sudden onset of substernal chest pressure radiating to the mid back. Evaluate for pulmonary embolism. EXAM: CT ANGIOGRAPHY CHEST WITH CONTRAST TECHNIQUE: Multidetector CT imaging of the chest was performed using the standard protocol during bolus administration of intravenous contrast. Multiplanar CT image reconstructions and MIPs were obtained to evaluate the vascular anatomy. RADIATION DOSE REDUCTION: This exam was performed according to the departmental dose-optimization program which includes automated  exposure control, adjustment of the mA and/or kV according to patient size and/or use of iterative reconstruction technique. CONTRAST:  176mL OMNIPAQUE IOHEXOL 350 MG/ML SOLN COMPARISON:  No priors. FINDINGS: Cardiovascular: No filling defects within the pulmonary arterial tree to suggest pulmonary embolism. Heart size is mildly enlarged with left ventricular dilatation. There is no significant pericardial fluid, thickening or pericardial calcification. No atherosclerotic calcifications are noted in the thoracic aorta or the coronary arteries. Ectasia of ascending thoracic aorta (4.1  cm in diameter). Mediastinum/Nodes: No pathologically enlarged mediastinal or hilar lymph nodes. Esophagus is unremarkable in appearance. No axillary lymphadenopathy. Lungs/Pleura: Trace bilateral pleural effusions lying dependently. Areas of ground-glass attenuation and interlobular septal thickening in the lungs bilaterally, most evident throughout the mid to lower lungs, likely reflective of interstitial pulmonary edema. No consolidative airspace disease. No definite suspicious appearing pulmonary nodules or masses are noted. Upper Abdomen: Diffuse low attenuation throughout the visualized hepatic parenchyma, indicative of a background of severe hepatic steatosis. Musculoskeletal: There are no aggressive appearing lytic or blastic lesions noted in the visualized portions of the skeleton. Review of the MIP images confirms the above findings. IMPRESSION: 1. No evidence of pulmonary embolism. 2. Cardiomegaly with evidence of interstitial pulmonary edema in the lungs and small bilateral pleural effusions; imaging findings once again concerning for probable congestive heart failure. 3. Ectasia of ascending thoracic aorta (4.1 cm in diameter). Recommend annual imaging followup by CTA or MRA. This recommendation follows 2010 ACCF/AHA/AATS/ACR/ASA/SCA/SCAI/SIR/STS/SVM Guidelines for the Diagnosis and Management of Patients with Thoracic  Aortic Disease. Circulation. 2010; [id number]:[id number]. Aortic aneurysm NOS (ICD10-I71.9). 4. Hepatic steatosis. Electronically Signed   By: Vinnie Langton M.D.   On: 07/03/2021 09:10   CT CORONARY MORPH W/CTA COR W/SCORE W/CA W/CM &/OR WO/CM  Addendum Date: 07/06/2021   ADDENDUM REPORT: 07/06/2021 13:32 CLINICAL DATA:  Chest pain/Cardiomyopathy EXAM: Cardiac CTA MEDICATIONS: Sub lingual nitro. 4 mg and coreg 6.25 mg TECHNIQUE: The patient was scanned on a [employer] 192 scanner. Gantry rotation speed was 250 msecs. Collimation was. 6 mm . A 120 kV prospective scan was triggered in the ascending thoracic aorta at 140 HU's with full mA between 30-70% of the R-R interval . Average HR during the scan was 75 bpm. The 3D data set was interpreted on a dedicated work station using MPR, MIP and VRT modes. A total of 80 cc of contrast was used. FINDINGS: Non-cardiac: See separate report from [facility]. No significant findings on limited lung and soft tissue windows. Calcium score: No calcium noted Coronary Arteries: Right dominant with no anomalies LM: Normal LAD: Normal D1: Normal D2: Normal Circumflex: Normal OM1: Normal OM2: Normal RCA: Normal PDA: Normal PLA: Normal IMPRESSION: 1. Calcium score 0 2.  Normal right dominant coronary arteries 3.  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   On: 07/06/2021 13:32   Result Date: 07/06/2021 EXAM: OVER-READ INTERPRETATION  CT CHEST The following report is an over-read performed by radiologist Dr. Aletta Edouard of [facility], [place] on 07/06/2021. This over-read does not include interpretation of cardiac or coronary anatomy or pathology. The coronary CTA interpretation by the cardiologist is attached. COMPARISON:  CTA of the chest on 07/03/2021 FINDINGS: Vascular: Mildly dilated ascending thoracic aorta measures up to approximately 4-4.1 cm in greatest diameter. Mediastinum/Nodes: Visualized mediastinum and hilar  regions demonstrate no lymphadenopathy or masses. Lungs/Pleura: Visualized lungs show no evidence of pulmonary edema, consolidation, pneumothorax, nodule or pleural fluid. Upper Abdomen: Stable hepatic steatosis. Musculoskeletal: No chest wall mass or suspicious bone lesions identified. IMPRESSION: 1. Reconfirmed evidence of mild aneurysmal disease of the ascending thoracic aorta measuring up to approximately 4.1 cm in greatest diameter. Recommend annual imaging followup by CTA or MRA. This recommendation follows 2010 ACCF/AHA/AATS/ACR/ASA/SCA/SCAI/SIR/STS/SVM Guidelines for the Diagnosis and Management of Patients with Thoracic Aortic Disease. Circulation. 2010; [id number]. Aortic aneurysm NOS (ICD10-I71.9) 2. Stable evidence of hepatic steatosis. Electronically Signed: By: Aletta Edouard M.D. On: 07/06/2021 13:25  ECHOCARDIOGRAM COMPLETE  Result Date: 07/04/2021    ECHOCARDIOGRAM REPORT   Patient Name:   Nicole Parker Date of Exam: 07/04/2021 Medical Rec #:  [id number]              Height:       65.0 in Accession #:    [id number]             Weight:       235.0 lb Date of Birth:  July 05, 1980               BSA:          2.119 m Patient Age:    47 years               BP:           151/88 mmHg Patient Gender: F                      HR:           79 bpm. Exam Location:  Inpatient Procedure: 2D Echo, 3D Echo, Cardiac Doppler, Color Doppler and Strain Analysis Indications:    CHF  History:        Patient has no prior history of Echocardiogram examinations.                 Risk Factors:Diabetes and Hypertension.  Sonographer:    Luisa Hart RDCS Referring Phys: [id number] [place]  1. Global hypokinesis worse in the anterolateral myocardium. Left ventricular ejection fraction, by estimation, is 40 to 45%. The left ventricle has mildly decreased function. The left ventricle demonstrates regional wall motion abnormalities (see scoring diagram/findings for description). There is moderate  concentric left ventricular hypertrophy. Left ventricular diastolic parameters are consistent with Grade II diastolic dysfunction (pseudonormalization). Elevated left ventricular end-diastolic pressure. The average left ventricular global longitudinal strain is -11.7 %. The global longitudinal strain is abnormal.  2. Right ventricular systolic function is normal. The right ventricular size is normal. There is normal pulmonary artery systolic pressure.  3. The mitral valve is normal in structure. No evidence of mitral valve regurgitation. No evidence of mitral stenosis.  4. The aortic valve was not well visualized. Aortic valve regurgitation is not visualized. No aortic stenosis is present.  5. Aortic dilatation noted. There is mild dilatation of the aortic root, measuring 37 mm.  6. The inferior vena cava is normal in size with greater than 50% respiratory variability, suggesting right atrial pressure of 3 mmHg. FINDINGS  Left Ventricle: Global hypokinesis worse in the anterolateral myocardium. Left ventricular ejection fraction, by estimation, is 40 to 45%. The left ventricle has mildly decreased function. The left ventricle demonstrates regional wall motion abnormalities. The average left ventricular global longitudinal strain is -11.7 %. The global longitudinal strain is abnormal. The left ventricular internal cavity size was normal in size. There is moderate concentric left ventricular hypertrophy. Left ventricular diastolic parameters are consistent with Grade II diastolic dysfunction (pseudonormalization). Elevated left ventricular end-diastolic pressure. Right Ventricle: The right ventricular size is normal. No increase in right ventricular wall thickness. Right ventricular systolic function is normal. There is normal pulmonary artery systolic pressure. The tricuspid regurgitant velocity is 2.29 m/s, and  with an assumed right atrial pressure of 3 mmHg, the estimated right ventricular systolic pressure is  0000000 mmHg. Left Atrium: Left atrial size was normal in size. Right Atrium: Right atrial size was normal in size. Pericardium: There is no evidence of pericardial effusion.  Mitral Valve: The mitral valve is normal in structure. No evidence of mitral valve regurgitation. No evidence of mitral valve stenosis. Tricuspid Valve: The tricuspid valve is normal in structure. Tricuspid valve regurgitation is trivial. No evidence of tricuspid stenosis. Aortic Valve: The aortic valve was not well visualized. Aortic valve regurgitation is not visualized. No aortic stenosis is present. Aortic valve mean gradient measures 5.0 mmHg. Aortic valve peak gradient measures 8.9 mmHg. Aortic valve area, by VTI measures 2.41 cm. Pulmonic Valve: The pulmonic valve was normal in structure. Pulmonic valve regurgitation is not visualized. No evidence of pulmonic stenosis. Aorta: Aortic dilatation noted. There is mild dilatation of the aortic root, measuring 37 mm. Venous: The inferior vena cava is normal in size with greater than 50% respiratory variability, suggesting right atrial pressure of 3 mmHg. IAS/Shunts: No atrial level shunt detected by color flow Doppler.  LEFT VENTRICLE PLAX 2D LVIDd:         5.30 cm     Diastology LVIDs:         4.10 cm     LV e' medial:    4.78 cm/s LV PW:         1.30 cm     LV E/e' medial:  19.7 LV IVS:        1.40 cm     LV e' lateral:   4.54 cm/s LVOT diam:     2.30 cm     LV E/e' lateral: 20.8 LV SV:         65 LV SV Index:   31          2D Longitudinal Strain LVOT Area:     4.15 cm    2D Strain GLS (A2C):   -8.1 %                            2D Strain GLS (A3C):   -13.4 %                            2D Strain GLS (A4C):   -13.8 % LV Volumes (MOD)           2D Strain GLS Avg:     -11.7 % LV vol d, MOD A2C: 76.8 ml LV vol d, MOD A4C: 82.9 ml LV vol s, MOD A2C: 47.5 ml LV vol s, MOD A4C: 49.7 ml LV SV MOD A2C:     29.3 ml LV SV MOD A4C:     82.9 ml LV SV MOD BP:      33.3 ml RIGHT VENTRICLE RV Basal diam:   3.90 cm RV Mid diam:    1.60 cm RV S prime:     12.30 cm/s TAPSE (M-mode): 1.8 cm LEFT ATRIUM           Index        RIGHT ATRIUM           Index LA diam:      3.00 cm 1.42 cm/m   RA Area:     14.90 cm LA Vol (A2C): 61.5 ml 29.03 ml/m  RA Volume:   33.40 ml  15.77 ml/m LA Vol (A4C): 51.4 ml 24.26 ml/m  AORTIC VALVE                     PULMONIC VALVE AV Area (Vmax):    2.36 cm      PV Vmax:  0.82 m/s AV Area (Vmean):   2.24 cm      PV Vmean:      63.200 cm/s AV Area (VTI):     2.41 cm      PV VTI:        0.218 m AV Vmax:           149.00 cm/s   PV Peak grad:  2.7 mmHg AV Vmean:          101.000 cm/s  PV Mean grad:  2.0 mmHg AV VTI:            0.269 m AV Peak Grad:      8.9 mmHg AV Mean Grad:      5.0 mmHg LVOT Vmax:         84.80 cm/s LVOT Vmean:        54.400 cm/s LVOT VTI:          0.156 m LVOT/AV VTI ratio: 0.58  AORTA Ao Root diam: 3.70 cm Ao Asc diam:  3.70 cm MITRAL VALVE               TRICUSPID VALVE MV Area (PHT): 4.21 cm    TR Peak grad:   21.0 mmHg MV Decel Time: 180 msec    TR Vmax:        229.00 cm/s MV E velocity: 94.30 cm/s MV A velocity: 67.30 cm/s  SHUNTS MV E/A ratio:  1.40        Systemic VTI:  0.16 m                            Systemic Diam: 2.30 cm Skeet Latch MD Electronically signed by Skeet Latch MD Signature Date/Time: 07/04/2021/4:03:58 PM    Final    VAS US RENAL ARTERY DUPLEX  Result Date: 07/06/2021 ABDOMINAL VISCERAL Patient Name:  Nicole Parker  Date of Exam:   07/06/2021 Medical Rec #: [id number]               Accession #:    [id number] Date of Birth: 10/26/1980                Patient Gender: F Patient Age:   50 years Exam Location[facility]  [facility] Procedure:      VAS US RENAL ARTERY DUPLEX Referring Phys: [id number] Margie Billet -------------------------------------------------------------------------------- Indications: Hypertension Limitations: Obesity. Comparison Study: No prior studies. Performing Technologist: Darlin Coco RDMS, RVT   Examination Guidelines: A complete evaluation includes B-mode imaging, spectral Doppler, color Doppler, and power Doppler as needed of all accessible portions of each vessel. Bilateral testing is considered an integral part of a complete examination. Limited examinations for reoccurring indications may be performed as noted.  Duplex Findings: +----------------------+--------+--------+------+--------+ Mesenteric            PSV cm/sEDV cm/sPlaqueComments +----------------------+--------+--------+------+--------+ Aorta Mid                85                          +----------------------+--------+--------+------+--------+ Celiac Artery Proximal  118                          +----------------------+--------+--------+------+--------+ SMA Proximal            150                          +----------------------+--------+--------+------+--------+    +------------------+--------+--------+-------+  Right Renal ArteryPSV cm/sEDV cm/sComment +------------------+--------+--------+-------+ Origin               76      28           +------------------+--------+--------+-------+ Proximal             85      39           +------------------+--------+--------+-------+ Mid                  83      25           +------------------+--------+--------+-------+ Distal               83      23           +------------------+--------+--------+-------+ +-----------------+--------+--------+-------+ Left Renal ArteryPSV cm/sEDV cm/sComment +-----------------+--------+--------+-------+ Origin             105      44           +-----------------+--------+--------+-------+ Proximal            96      34           +-----------------+--------+--------+-------+ Mid                103      34           +-----------------+--------+--------+-------+ Distal             128      47           +-----------------+--------+--------+-------+  +------------+--------+--------+----+-----------+--------+--------+----+ Right KidneyPSV cm/sEDV cm/sRI  Left KidneyPSV cm/sEDV cm/sRI   +------------+--------+--------+----+-----------+--------+--------+----+ Upper Pole  26      9       0.63Upper Pole 29      13      0.56 +------------+--------+--------+----+-----------+--------+--------+----+ Mid         28      8       0.71Mid        34      11      0.68 +------------+--------+--------+----+-----------+--------+--------+----+ Lower Pole  23      11      0.51Lower Pole 19      8       0.60 +------------+--------+--------+----+-----------+--------+--------+----+ Hilar       45      18      0.60Hilar      53      18      0.66 +------------+--------+--------+----+-----------+--------+--------+----+ +------------------+-----+------------------+-----+ Right Kidney           Left Kidney             +------------------+-----+------------------+-----+ RAR                    RAR                     +------------------+-----+------------------+-----+ RAR (manual)      1.00 RAR (manual)      1.51  +------------------+-----+------------------+-----+ Cortex                 Cortex                  +------------------+-----+------------------+-----+ Cortex thickness       Corex thickness         +------------------+-----+------------------+-----+ Kidney length (cm)12.80Kidney length (cm)13.10 +------------------+-----+------------------+-----+  Summary: Renal:  Right: No evidence of right renal artery stenosis. Normal right  Resisitive Index. RRV flow present. Left:  No evidence of left renal artery stenosis. Normal left        Resistive Index. LRV flow present. Mesenteric: Normal Celiac artery and Superior Mesenteric artery findings.  *See table(s) above for measurements and observations.  Diagnosing physician: Deitra Mayo MD  Electronically signed by Deitra Mayo MD on 07/06/2021 at  3:36:40 PM.    Final     Microbiology: Results for orders placed or performed during the hospital encounter of 07/03/21  MRSA Next Gen by PCR, Nasal     Status: None   Collection Time: 07/03/21  4:50 PM   Specimen: Nasal Mucosa; Nasal Swab  Result Value Ref Range Status   MRSA by PCR Next Gen NOT DETECTED NOT DETECTED Final    Comment: (NOTE) The GeneXpert MRSA Assay (FDA approved for NASAL specimens only), is one component of a comprehensive MRSA colonization surveillance program. It is not intended to diagnose MRSA infection nor to guide or monitor treatment for MRSA infections. Test performance is not FDA approved in patients less than 68 years old. Performed at [facility], [place] [address] [place], [place] [address]     Labs: CBC: Recent Labs  Lab 07/03/21 0624 07/04/21 0048  WBC 8.2 8.2  HGB 13.5 13.8  HCT 40.2 40.6  MCV 81.5 81.7  PLT 283 [id number]   Basic Metabolic Panel: Recent Labs  Lab 07/03/21 0624 07/03/21 0751 07/04/21 0048 07/05/21 0124 07/06/21 0305  NA 135  --  134* 137 135  K 3.4*  --  3.4* 3.8 4.1  CL 99  --  104 106 107  CO2 23  --  25 24 23   GLUCOSE 203*  --  142* 151* 157*  BUN 15  --  9 15 21*  CREATININE 0.54  --  0.58 0.63 0.79  CALCIUM 8.5*  --  8.4* 8.6* 8.7*  MG  --  1.6* 2.0  --   --    Liver Function Tests: Recent Labs  Lab 07/03/21 0641  AST 24  ALT 26  ALKPHOS 67  BILITOT 0.5  PROT 7.5  ALBUMIN 4.0   CBG: Recent Labs  Lab 07/05/21 1612 07/05/21 2103 07/06/21 0620 07/06/21 1148 07/06/21 1650  GLUCAP 186* 163* 164* 160* 161*    Discharge time spent: approximately 50 minutes spent on discharge counseling, evaluation of patient on day of discharge, and coordination of discharge planning with nursing, social work, pharmacy and case management  Signed: Edwin Dada, MD [facility] 07/06/2021

## 2021-07-12 ENCOUNTER — Encounter: Payer: Self-pay | Admitting: Nurse Practitioner

## 2021-07-12 ENCOUNTER — Ambulatory Visit (INDEPENDENT_AMBULATORY_CARE_PROVIDER_SITE_OTHER): Payer: Self-pay | Admitting: Nurse Practitioner

## 2021-07-12 VITALS — BP 138/92 | HR 75 | Temp 97.3°F | Ht 63.0 in | Wt 234.2 lb

## 2021-07-12 DIAGNOSIS — K760 Fatty (change of) liver, not elsewhere classified: Secondary | ICD-10-CM

## 2021-07-12 DIAGNOSIS — I10 Essential (primary) hypertension: Secondary | ICD-10-CM

## 2021-07-12 DIAGNOSIS — I5043 Acute on chronic combined systolic (congestive) and diastolic (congestive) heart failure: Secondary | ICD-10-CM

## 2021-07-12 DIAGNOSIS — I712 Thoracic aortic aneurysm, without rupture, unspecified: Secondary | ICD-10-CM

## 2021-07-12 LAB — ALDOSTERONE + RENIN ACTIVITY W/ RATIO
ALDO / PRA Ratio: 4.9 (ref 0.0–30.0)
Aldosterone: 16.8 ng/dL (ref 0.0–30.0)
PRA LC/MS/MS: 3.461 ng/mL/hr (ref 0.167–5.380)

## 2021-07-12 NOTE — Assessment & Plan Note (Signed)
2. Acute on chronic combined systolic and diastolic CHF (congestive heart failure) (HCC)   3. Fatty liver   4. Thoracic aortic aneurysm without rupture, unspecified part [facility])  - Ambulatory referral to Vascular Surgery   - will need repeat CT in 1 year  Continue current medications:  Current Outpatient Medications on File Prior to Visit  Medication Sig Dispense Refill  . carvedilol (COREG) 6.25 MG tablet Take 1 tablet (6.25 mg total) by mouth 2 (two) times daily with a meal. 60 tablet 6  . furosemide (LASIX) 20 MG tablet Take 1 tablet (20 mg total) by mouth daily. 30 tablet 6  . losartan (COZAAR) 25 MG tablet Take 1 tablet (25 mg total) by mouth daily. 30 tablet 6  . metFORMIN (GLUCOPHAGE) 500 MG tablet Take 2 tablets (1,000 mg total) by mouth 2 (two) times daily with a meal. 120 tablet 3  . potassium chloride (KLOR-CON M) 10 MEQ tablet Take 1 tablet (10 mEq total) by mouth at bedtime. 30 tablet 6  . spironolactone (ALDACTONE) 25 MG tablet Take 1 tablet (25 mg total) by mouth daily. 30 tablet 6   No current facility-administered medications on file prior to visit.    Diabetic low salt diet   Follow up:  Follow up in 3 months diabetes and HTN

## 2021-07-12 NOTE — Progress Notes (Signed)
@Patient  ID: Nicole Parker, female    DOB: [birth year], 41 y.o.   MRN: [id number]  Chief Complaint  Patient presents with   Hospitalization [facility] follow up; CHF and Hypertension Patient will be using using the interpreter service today. Patient states that she is feeling very well since her discharge.    Referring provider: No ref. provider found  Recent Significant Events:  Hospital Admission: 07/03/21  Hospital Course: [name] is a 41 y.o. F with untreated HTN and DM who presented with shortness of breath and chest pain.   BP 218/132 on admission and had clinical CHF and chest pain.     * Acute on chronic combined systolic and diastolic CHF (congestive heart failure) ([place]) Presented with dyspnea on exertion and imaging showing edema and effusions.  She was treated with IV Lasix and her symptoms resolved, she had no orthopnea or dyspnea on exertion.   Echo showed new EF 40-45% with grade II DD and RWMA anterolaterally and normal valves.  Cardiology were consulted and performed renal ultrasound to rule out renal artery stenosis, and CT coronary angiogram that ruled out atherosclerosis.   Amlodipine was stopped and she was started on Coreg, losartan, spironolactone, and furosemide.  Follow-up with cardiology was arranged.      Hypertensive emergency Presented with BP >210/130 and symptoms of CHF.     Blood pressure was lower gradually initially, and then she was started on Coreg, spironolactone, losartan, and furosemide, blood pressure near goal but will need titration.   Renal artery [place] normal.  PRA pending.   Note: She previously had adverse effects from lisinopril and losartan which she feels was due to starting these at relatively high doses.  She has expressed preference for slow up-titration of medications if possible.     Nonalcoholic fatty liver Seen incidentally on CT chest.   Thoracic aortic aneurysm ([place]) Incidental finding.  4.1cm on CTA.    - Outpatient follow up in 1 year  HPI  Patient presents today for hospital follow-up.  Please see notes above for hospital course.  Patient states that she has been doing well since hospital discharge.  She does have [facility].  She was able to get all of her prescriptions filled and has been taking medications as directed.  Vital signs are stable today.  Referral will be placed for vascular to follow incidental finding of thoracic aortic aneurysm on CT scan.  Patient has no new issues or concerns today.  Spanish interpreter was used for this visit today. Denies f/c/s, n/v/d, hemoptysis, PND, chest pain or edema.      No Known Allergies  Immunization History  Administered Date(s) Administered   Tdap 11/03/2013    Past Medical History:  Diagnosis Date   Gestational diabetes    Hypertension    Pregnancy induced hypertension     Tobacco History: Social History   Tobacco Use  Smoking Status Never  Smokeless Tobacco Never   Counseling given: Not Answered   Outpatient Encounter Medications as of 07/12/2021  Medication Sig   carvedilol (COREG) 6.25 MG tablet Take 1 tablet (6.25 mg total) by mouth 2 (two) times daily with a meal.   furosemide (LASIX) 20 MG tablet Take 1 tablet (20 mg total) by mouth daily.   losartan (COZAAR) 25 MG tablet Take 1 tablet (25 mg total) by mouth daily.   metFORMIN (GLUCOPHAGE) 500 MG tablet Take 2 tablets (1,000 mg total) by mouth 2 (two)  times daily with a meal.   potassium chloride (KLOR-CON M) 10 MEQ tablet Take 1 tablet (10 mEq total) by mouth at bedtime.   spironolactone (ALDACTONE) 25 MG tablet Take 1 tablet (25 mg total) by mouth daily.   No facility-administered encounter medications on file as of 07/12/2021.     Review of Systems  Review of Systems  Constitutional: Negative.   HENT: Negative.    Cardiovascular: Negative.   Gastrointestinal: Negative.   Allergic/Immunologic: Negative.    Neurological: Negative.   Psychiatric/Behavioral: Negative.         Physical Exam  BP (!) 138/92   Pulse 75   Temp (!) 97.3 F (36.3 C)   Ht 5\' 3"  (1.6 m)   Wt 234 lb 3.2 oz (106.2 kg)   SpO2 99%   Breastfeeding No   BMI 41.49 kg/m   Wt Readings from Last 5 Encounters:  07/12/21 234 lb 3.2 oz (106.2 kg)  07/06/21 239 lb 10.2 oz (108.7 kg)  03/20/21 249 lb 6.4 oz (113.1 kg)  03/05/21 236 lb (107 kg)  02/03/20 231 lb (104.8 kg)     Physical Exam Vitals and nursing note reviewed.  Constitutional:      General: She is not in acute distress.    Appearance: She is well-developed.  Cardiovascular:     Rate and Rhythm: Normal rate and regular rhythm.  Pulmonary:     Effort: Pulmonary effort is normal.     Breath sounds: Normal breath sounds.  Neurological:     Mental Status: She is alert and oriented to person, place, and time.      Lab Results:  CBC    Component Value Date/Time   WBC 8.2 07/04/2021 0048   RBC 4.97 07/04/2021 0048   HGB 13.8 07/04/2021 0048   HGB 11.6 04/05/2019 1112   HCT 40.6 07/04/2021 0048   HCT 35.8 04/05/2019 1112   PLT 269 07/04/2021 0048   PLT 386 04/05/2019 1112   MCV 81.7 07/04/2021 0048   MCV 71 (L) 04/05/2019 1112   MCH 27.8 07/04/2021 0048   MCHC 34.0 07/04/2021 0048   RDW 13.0 07/04/2021 0048   RDW 15.7 (H) 04/05/2019 1112    BMET    Component Value Date/Time   NA 135 07/06/2021 0305   NA 138 04/05/2019 1112   K 4.1 07/06/2021 0305   CL 107 07/06/2021 0305   CO2 23 07/06/2021 0305   GLUCOSE 157 (H) 07/06/2021 0305   BUN 21 (H) 07/06/2021 0305   BUN 11 04/05/2019 1112   CREATININE 0.79 07/06/2021 0305   CALCIUM 8.7 (L) 07/06/2021 0305   GFRNONAA >60 07/06/2021 0305   GFRAA 121 04/05/2019 1112    BNP    Component Value Date/Time   BNP 105.8 (H) 07/03/2021 0641    ProBNP No results found for: "PROBNP"  Imaging: VAS US RENAL ARTERY DUPLEX  Result Date: 07/06/2021 ABDOMINAL VISCERAL Patient Name:  Nicole Parker  Date of Exam:   07/06/2021 Medical Rec #: [id number]               Accession #:    [id number] Date of Birth: 1980/08/14                Patient Gender: F Patient Age:   36 years Exam Location:  [facility] Procedure:      VAS US RENAL ARTERY DUPLEX Referring Phys: [id number] Margie Billet -------------------------------------------------------------------------------- Indications: Hypertension Limitations: Obesity. Comparison Study: No prior studies. Performing Technologist:  Darlin Coco RDMS, RVT  Examination Guidelines: A complete evaluation includes B-mode imaging, spectral Doppler, color Doppler, and power Doppler as needed of all accessible portions of each vessel. Bilateral testing is considered an integral part of a complete examination. Limited examinations for reoccurring indications may be performed as noted.  Duplex Findings: +----------------------+--------+--------+------+--------+ Mesenteric            PSV cm/sEDV cm/sPlaqueComments +----------------------+--------+--------+------+--------+ Aorta Mid                85                          +----------------------+--------+--------+------+--------+ Celiac Artery Proximal  118                          +----------------------+--------+--------+------+--------+ SMA Proximal            150                          +----------------------+--------+--------+------+--------+    +------------------+--------+--------+-------+ Right Renal ArteryPSV cm/sEDV cm/sComment +------------------+--------+--------+-------+ Origin               76      28           +------------------+--------+--------+-------+ Proximal             85      39           +------------------+--------+--------+-------+ Mid                  83      25           +------------------+--------+--------+-------+ Distal               83      23           +------------------+--------+--------+-------+  +-----------------+--------+--------+-------+ Left Renal ArteryPSV cm/sEDV cm/sComment +-----------------+--------+--------+-------+ Origin             105      44           +-----------------+--------+--------+-------+ Proximal            96      34           +-----------------+--------+--------+-------+ Mid                103      34           +-----------------+--------+--------+-------+ Distal             128      47           +-----------------+--------+--------+-------+ +------------+--------+--------+----+-----------+--------+--------+----+ Right KidneyPSV cm/sEDV cm/sRI  Left KidneyPSV cm/sEDV cm/sRI   +------------+--------+--------+----+-----------+--------+--------+----+ Upper Pole  26      9       0.63Upper Pole 29      13      0.56 +------------+--------+--------+----+-----------+--------+--------+----+ Mid         28      8       0.71Mid        34      11      0.68 +------------+--------+--------+----+-----------+--------+--------+----+ Lower Pole  23      11      0.51Lower Pole 19      8       0.60 +------------+--------+--------+----+-----------+--------+--------+----+ Hilar       45      18  0.60Hilar      53      18      0.66 +------------+--------+--------+----+-----------+--------+--------+----+ +------------------+-----+------------------+-----+ Right Kidney           Left Kidney             +------------------+-----+------------------+-----+ RAR                    RAR                     +------------------+-----+------------------+-----+ RAR (manual)      1.00 RAR (manual)      1.51  +------------------+-----+------------------+-----+ Cortex                 Cortex                  +------------------+-----+------------------+-----+ Cortex thickness       Corex thickness         +------------------+-----+------------------+-----+ Kidney length (cm)12.80Kidney length (cm)13.10  +------------------+-----+------------------+-----+  Summary: Renal:  Right: No evidence of right renal artery stenosis. Normal right        Resisitive Index. RRV flow present. Left:  No evidence of left renal artery stenosis. Normal left        Resistive Index. LRV flow present. Mesenteric: Normal Celiac artery and Superior Mesenteric artery findings.  *See table(s) above for measurements and observations.  Diagnosing physician: Deitra Mayo MD  Electronically signed by Deitra Mayo MD on 07/06/2021 at 3:36:40 PM.    Final    CT CORONARY MORPH W/CTA COR W/SCORE W/CA W/CM &/OR WO/CM  Addendum Date: 07/06/2021   ADDENDUM REPORT: 07/06/2021 13:32 CLINICAL DATA:  Chest pain/Cardiomyopathy EXAM: Cardiac CTA MEDICATIONS: Sub lingual nitro. 4 mg and coreg 6.25 mg TECHNIQUE: The patient was scanned on a [employer] 192 scanner. Gantry rotation speed was 250 msecs. Collimation was. 6 mm . A 120 kV prospective scan was triggered in the ascending thoracic aorta at 140 HU's with full mA between 30-70% of the R-R interval . Average HR during the scan was 75 bpm. The 3D data set was interpreted on a dedicated work station using MPR, MIP and VRT modes. A total of 80 cc of contrast was used. FINDINGS: Non-cardiac: See separate report from [facility]. No significant findings on limited lung and soft tissue windows. Calcium score: No calcium noted Coronary Arteries: Right dominant with no anomalies LM: Normal LAD: Normal D1: Normal D2: Normal Circumflex: Normal OM1: Normal OM2: Normal RCA: Normal PDA: Normal PLA: Normal IMPRESSION: 1. Calcium score 0 2.  Normal right dominant coronary arteries 3.  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   On: 07/06/2021 13:32   Result Date: 07/06/2021 EXAM: OVER-READ INTERPRETATION  CT CHEST The following report is an over-read performed by radiologist Dr. Aletta Edouard of [facility], [place] on 07/06/2021. This  over-read does not include interpretation of cardiac or coronary anatomy or pathology. The coronary CTA interpretation by the cardiologist is attached. COMPARISON:  CTA of the chest on 07/03/2021 FINDINGS: Vascular: Mildly dilated ascending thoracic aorta measures up to approximately 4-4.1 cm in greatest diameter. Mediastinum/Nodes: Visualized mediastinum and hilar regions demonstrate no lymphadenopathy or masses. Lungs/Pleura: Visualized lungs show no evidence of pulmonary edema, consolidation, pneumothorax, nodule or pleural fluid. Upper Abdomen: Stable hepatic steatosis. Musculoskeletal: No chest wall mass or suspicious bone lesions identified. IMPRESSION: 1. Reconfirmed evidence of mild aneurysmal disease of the ascending thoracic aorta measuring up to approximately 4.1  cm in greatest diameter. Recommend annual imaging followup by CTA or MRA. This recommendation follows 2010 ACCF/AHA/AATS/ACR/ASA/SCA/SCAI/SIR/STS/SVM Guidelines for the Diagnosis and Management of Patients with Thoracic Aortic Disease. Circulation. 2010; [id number]:[id number]. Aortic aneurysm NOS (ICD10-I71.9) 2. Stable evidence of hepatic steatosis. Electronically Signed: By: Aletta Edouard M.D. On: 07/06/2021 13:25   ECHOCARDIOGRAM COMPLETE  Result Date: 07/04/2021    ECHOCARDIOGRAM REPORT   Patient Name:   DASHANNA HORNBACHER Date of Exam: 07/04/2021 Medical Rec #:  [id number]              Height:       65.0 in Accession #:    [id number]             Weight:       235.0 lb Date of Birth:  Oct 26, 1980               BSA:          2.119 m Patient Age:    36 years               BP:           151/88 mmHg Patient Gender: F                      HR:           79 bpm. Exam Location:  Inpatient Procedure: 2D Echo, 3D Echo, Cardiac Doppler, Color Doppler and Strain Analysis Indications:    CHF  History:        Patient has no prior history of Echocardiogram examinations.                 Risk Factors:Diabetes and Hypertension.  Sonographer:    Luisa Hart  RDCS Referring Phys: [id number] [place]  1. Global hypokinesis worse in the anterolateral myocardium. Left ventricular ejection fraction, by estimation, is 40 to 45%. The left ventricle has mildly decreased function. The left ventricle demonstrates regional wall motion abnormalities (see scoring diagram/findings for description). There is moderate concentric left ventricular hypertrophy. Left ventricular diastolic parameters are consistent with Grade II diastolic dysfunction (pseudonormalization). Elevated left ventricular end-diastolic pressure. The average left ventricular global longitudinal strain is -11.7 %. The global longitudinal strain is abnormal.  2. Right ventricular systolic function is normal. The right ventricular size is normal. There is normal pulmonary artery systolic pressure.  3. The mitral valve is normal in structure. No evidence of mitral valve regurgitation. No evidence of mitral stenosis.  4. The aortic valve was not well visualized. Aortic valve regurgitation is not visualized. No aortic stenosis is present.  5. Aortic dilatation noted. There is mild dilatation of the aortic root, measuring 37 mm.  6. The inferior vena cava is normal in size with greater than 50% respiratory variability, suggesting right atrial pressure of 3 mmHg. FINDINGS  Left Ventricle: Global hypokinesis worse in the anterolateral myocardium. Left ventricular ejection fraction, by estimation, is 40 to 45%. The left ventricle has mildly decreased function. The left ventricle demonstrates regional wall motion abnormalities. The average left ventricular global longitudinal strain is -11.7 %. The global longitudinal strain is abnormal. The left ventricular internal cavity size was normal in size. There is moderate concentric left ventricular hypertrophy. Left ventricular diastolic parameters are consistent with Grade II diastolic dysfunction (pseudonormalization). Elevated left ventricular end-diastolic  pressure. Right Ventricle: The right ventricular size is normal. No increase in right ventricular wall thickness. Right ventricular systolic function is normal. There is normal pulmonary artery  systolic pressure. The tricuspid regurgitant velocity is 2.29 m/s, and  with an assumed right atrial pressure of 3 mmHg, the estimated right ventricular systolic pressure is 24.0 mmHg. Left Atrium: Left atrial size was normal in size. Right Atrium: Right atrial size was normal in size. Pericardium: There is no evidence of pericardial effusion. Mitral Valve: The mitral valve is normal in structure. No evidence of mitral valve regurgitation. No evidence of mitral valve stenosis. Tricuspid Valve: The tricuspid valve is normal in structure. Tricuspid valve regurgitation is trivial. No evidence of tricuspid stenosis. Aortic Valve: The aortic valve was not well visualized. Aortic valve regurgitation is not visualized. No aortic stenosis is present. Aortic valve mean gradient measures 5.0 mmHg. Aortic valve peak gradient measures 8.9 mmHg. Aortic valve area, by VTI measures 2.41 cm. Pulmonic Valve: The pulmonic valve was normal in structure. Pulmonic valve regurgitation is not visualized. No evidence of pulmonic stenosis. Aorta: Aortic dilatation noted. There is mild dilatation of the aortic root, measuring 37 mm. Venous: The inferior vena cava is normal in size with greater than 50% respiratory variability, suggesting right atrial pressure of 3 mmHg. IAS/Shunts: No atrial level shunt detected by color flow Doppler.  LEFT VENTRICLE PLAX 2D LVIDd:         5.30 cm     Diastology LVIDs:         4.10 cm     LV e' medial:    4.78 cm/s LV PW:         1.30 cm     LV E/e' medial:  19.7 LV IVS:        1.40 cm     LV e' lateral:   4.54 cm/s LVOT diam:     2.30 cm     LV E/e' lateral: 20.8 LV SV:         65 LV SV Index:   31          2D Longitudinal Strain LVOT Area:     4.15 cm    2D Strain GLS (A2C):   -8.1 %                            2D  Strain GLS (A3C):   -13.4 %                            2D Strain GLS (A4C):   -13.8 % LV Volumes (MOD)           2D Strain GLS Avg:     -11.7 % LV vol d, MOD A2C: 76.8 ml LV vol d, MOD A4C: 82.9 ml LV vol s, MOD A2C: 47.5 ml LV vol s, MOD A4C: 49.7 ml LV SV MOD A2C:     29.3 ml LV SV MOD A4C:     82.9 ml LV SV MOD BP:      33.3 ml RIGHT VENTRICLE RV Basal diam:  3.90 cm RV Mid diam:    1.60 cm RV S prime:     12.30 cm/s TAPSE (M-mode): 1.8 cm LEFT ATRIUM           Index        RIGHT ATRIUM           Index LA diam:      3.00 cm 1.42 cm/m   RA Area:     14.90 cm LA Vol (A2C): 61.5 ml 29.03 ml/m  RA  Volume:   33.40 ml  15.77 ml/m LA Vol (A4C): 51.4 ml 24.26 ml/m  AORTIC VALVE                     PULMONIC VALVE AV Area (Vmax):    2.36 cm      PV Vmax:       0.82 m/s AV Area (Vmean):   2.24 cm      PV Vmean:      63.200 cm/s AV Area (VTI):     2.41 cm      PV VTI:        0.218 m AV Vmax:           149.00 cm/s   PV Peak grad:  2.7 mmHg AV Vmean:          101.000 cm/s  PV Mean grad:  2.0 mmHg AV VTI:            0.269 m AV Peak Grad:      8.9 mmHg AV Mean Grad:      5.0 mmHg LVOT Vmax:         84.80 cm/s LVOT Vmean:        54.400 cm/s LVOT VTI:          0.156 m LVOT/AV VTI ratio: 0.58  AORTA Ao Root diam: 3.70 cm Ao Asc diam:  3.70 cm MITRAL VALVE               TRICUSPID VALVE MV Area (PHT): 4.21 cm    TR Peak grad:   21.0 mmHg MV Decel Time: 180 msec    TR Vmax:        229.00 cm/s MV E velocity: 94.30 cm/s MV A velocity: 67.30 cm/s  SHUNTS MV E/A ratio:  1.40        Systemic VTI:  0.16 m                            Systemic Diam: 2.30 cm Skeet Latch MD Electronically signed by Skeet Latch MD Signature Date/Time: 07/04/2021/4:03:58 PM    Final    CT Angio Chest PE W and/or Wo Contrast  Result Date: 07/03/2021 CLINICAL DATA:  41 year old female with history of sudden onset of substernal chest pressure radiating to the mid back. Evaluate for pulmonary embolism. EXAM: CT ANGIOGRAPHY CHEST WITH CONTRAST  TECHNIQUE: Multidetector CT imaging of the chest was performed using the standard protocol during bolus administration of intravenous contrast. Multiplanar CT image reconstructions and MIPs were obtained to evaluate the vascular anatomy. RADIATION DOSE REDUCTION: This exam was performed according to the departmental dose-optimization program which includes automated exposure control, adjustment of the mA and/or kV according to patient size and/or use of iterative reconstruction technique. CONTRAST:  147mL OMNIPAQUE IOHEXOL 350 MG/ML SOLN COMPARISON:  No priors. FINDINGS: Cardiovascular: No filling defects within the pulmonary arterial tree to suggest pulmonary embolism. Heart size is mildly enlarged with left ventricular dilatation. There is no significant pericardial fluid, thickening or pericardial calcification. No atherosclerotic calcifications are noted in the thoracic aorta or the coronary arteries. Ectasia of ascending thoracic aorta (4.1 cm in diameter). Mediastinum/Nodes: No pathologically enlarged mediastinal or hilar lymph nodes. Esophagus is unremarkable in appearance. No axillary lymphadenopathy. Lungs/Pleura: Trace bilateral pleural effusions lying dependently. Areas of ground-glass attenuation and interlobular septal thickening in the lungs bilaterally, most evident throughout the mid to lower lungs, likely reflective of interstitial pulmonary edema. No consolidative airspace disease.  No definite suspicious appearing pulmonary nodules or masses are noted. Upper Abdomen: Diffuse low attenuation throughout the visualized hepatic parenchyma, indicative of a background of severe hepatic steatosis. Musculoskeletal: There are no aggressive appearing lytic or blastic lesions noted in the visualized portions of the skeleton. Review of the MIP images confirms the above findings. IMPRESSION: 1. No evidence of pulmonary embolism. 2. Cardiomegaly with evidence of interstitial pulmonary edema in the lungs and  small bilateral pleural effusions; imaging findings once again concerning for probable congestive heart failure. 3. Ectasia of ascending thoracic aorta (4.1 cm in diameter). Recommend annual imaging followup by CTA or MRA. This recommendation follows 2010 ACCF/AHA/AATS/ACR/ASA/SCA/SCAI/SIR/STS/SVM Guidelines for the Diagnosis and Management of Patients with Thoracic Aortic Disease. Circulation. 2010; [id number]. Aortic aneurysm NOS (ICD10-I71.9). 4. Hepatic steatosis. Electronically Signed   By: Vinnie Langton M.D.   On: 07/03/2021 09:10   DG Chest 2 View  Result Date: 07/03/2021 CLINICAL DATA:  41 year old female with history of central chest pressure and shortness of breath. Nausea. EXAM: CHEST - 2 VIEW COMPARISON:  No priors. FINDINGS: There is cephalization of the pulmonary vasculature and slight indistinctness of the interstitial markings suggestive of mild pulmonary edema. Trace bilateral pleural effusions. No pneumothorax. Heart size is mildly enlarged. Upper mediastinal contours are within normal limits. IMPRESSION: 1. The appearance the chest is concerning for developing congestive heart failure, as above. Electronically Signed   By: Vinnie Langton M.D.   On: 07/03/2021 06:27     Assessment & Plan:   Primary hypertension 2. Acute on chronic combined systolic and diastolic CHF (congestive heart failure) ([place])   3. Fatty liver   4. Thoracic aortic aneurysm without rupture, unspecified part ([place])  - Ambulatory referral to Vascular Surgery   - will need repeat CT in 1 year  Continue current medications:  Current Outpatient Medications on File Prior to Visit  Medication Sig Dispense Refill   carvedilol (COREG) 6.25 MG tablet Take 1 tablet (6.25 mg total) by mouth 2 (two) times daily with a meal. 60 tablet 6   furosemide (LASIX) 20 MG tablet Take 1 tablet (20 mg total) by mouth daily. 30 tablet 6   losartan (COZAAR) 25 MG tablet Take 1 tablet (25 mg total) by mouth daily. 30  tablet 6   metFORMIN (GLUCOPHAGE) 500 MG tablet Take 2 tablets (1,000 mg total) by mouth 2 (two) times daily with a meal. 120 tablet 3   potassium chloride (KLOR-CON M) 10 MEQ tablet Take 1 tablet (10 mEq total) by mouth at bedtime. 30 tablet 6   spironolactone (ALDACTONE) 25 MG tablet Take 1 tablet (25 mg total) by mouth daily. 30 tablet 6   No current facility-administered medications on file prior to visit.    Diabetic low salt diet   Follow up:  Follow up in 3 months diabetes and HTN     Fenton Foy, NP 07/12/2021

## 2021-07-12 NOTE — Patient Instructions (Signed)
1. Primary hypertension   2. Acute on chronic combined systolic and diastolic CHF (congestive heart failure) (HCC)   3. Fatty liver   4. Thoracic aortic aneurysm without rupture, unspecified part (HCC)  - Ambulatory referral to Vascular Surgery   - will need repeat CT in 1 year  Continue current medications:  Current Outpatient Medications on File Prior to Visit  Medication Sig Dispense Refill   carvedilol (COREG) 6.25 MG tablet Take 1 tablet (6.25 mg total) by mouth 2 (two) times daily with a meal. 60 tablet 6   furosemide (LASIX) 20 MG tablet Take 1 tablet (20 mg total) by mouth daily. 30 tablet 6   losartan (COZAAR) 25 MG tablet Take 1 tablet (25 mg total) by mouth daily. 30 tablet 6   metFORMIN (GLUCOPHAGE) 500 MG tablet Take 2 tablets (1,000 mg total) by mouth 2 (two) times daily with a meal. 120 tablet 3   potassium chloride (KLOR-CON M) 10 MEQ tablet Take 1 tablet (10 mEq total) by mouth at bedtime. 30 tablet 6   spironolactone (ALDACTONE) 25 MG tablet Take 1 tablet (25 mg total) by mouth daily. 30 tablet 6   No current facility-administered medications on file prior to visit.    Diabetic low salt diet   Follow up:  Follow up in 3 months diabetes and HTN

## 2021-07-13 ENCOUNTER — Encounter: Payer: Self-pay | Admitting: *Deleted

## 2021-07-18 ENCOUNTER — Telehealth (HOSPITAL_COMMUNITY): Payer: Self-pay | Admitting: *Deleted

## 2021-07-18 ENCOUNTER — Telehealth (HOSPITAL_COMMUNITY): Payer: Self-pay

## 2021-07-18 ENCOUNTER — Ambulatory Visit (HOSPITAL_COMMUNITY)
Admit: 2021-07-18 | Discharge: 2021-07-18 | Disposition: A | Discharge status: Home / Self Care | Payer: Self-pay | Attending: Adult Health | Admitting: Adult Health

## 2021-07-18 ENCOUNTER — Encounter (HOSPITAL_COMMUNITY): Payer: Self-pay

## 2021-07-18 VITALS — BP 140/100 | HR 66 | Wt 236.6 lb

## 2021-07-18 DIAGNOSIS — Z79899 Other long term (current) drug therapy: Secondary | ICD-10-CM | POA: Insufficient documentation

## 2021-07-18 DIAGNOSIS — I712 Thoracic aortic aneurysm, without rupture, unspecified: Secondary | ICD-10-CM | POA: Insufficient documentation

## 2021-07-18 DIAGNOSIS — I5040 Unspecified combined systolic (congestive) and diastolic (congestive) heart failure: Secondary | ICD-10-CM | POA: Insufficient documentation

## 2021-07-18 DIAGNOSIS — I7121 Aneurysm of the ascending aorta, without rupture: Secondary | ICD-10-CM

## 2021-07-18 DIAGNOSIS — Z8679 Personal history of other diseases of the circulatory system: Secondary | ICD-10-CM | POA: Insufficient documentation

## 2021-07-18 DIAGNOSIS — E119 Type 2 diabetes mellitus without complications: Secondary | ICD-10-CM | POA: Insufficient documentation

## 2021-07-18 DIAGNOSIS — I10 Essential (primary) hypertension: Secondary | ICD-10-CM

## 2021-07-18 DIAGNOSIS — I5042 Chronic combined systolic (congestive) and diastolic (congestive) heart failure: Secondary | ICD-10-CM

## 2021-07-18 DIAGNOSIS — I110 Hypertensive heart disease with heart failure: Secondary | ICD-10-CM | POA: Insufficient documentation

## 2021-07-18 LAB — BASIC METABOLIC PANEL
Anion gap: 8 (ref 5–15)
BUN: 17 mg/dL (ref 6–20)
CO2: 27 mmol/L (ref 22–32)
Calcium: 9 mg/dL (ref 8.9–10.3)
Chloride: 102 mmol/L (ref 98–111)
Creatinine, Ser: 0.7 mg/dL (ref 0.44–1.00)
GFR, Estimated: >60 mL/min (ref >60)
Glucose, Bld: 160 mg/dL — ABNORMAL HIGH (ref 70–99)
Potassium: 4.6 mmol/L (ref 3.5–5.1)
Sodium: 137 mmol/L (ref 135–145)

## 2021-07-18 MED ORDER — LOSARTAN POTASSIUM 50 MG PO TABS: 50.0000 mg | ORAL_TABLET | Freq: Every day | ORAL | 3 refills | Status: DC

## 2021-07-18 MED ORDER — DAPAGLIFLOZIN PROPANEDIOL 10 MG PO TABS: 10.0000 mg | ORAL_TABLET | Freq: Every day | ORAL | 0 refills | Status: DC

## 2021-07-18 NOTE — Telephone Encounter (Signed)
Call attempted to confirm HV TOC appt 2 pm on 07/18/21. HIPPA appropriate VM left with callback number.    Earnestine Leys, BSN, Clinical cytogeneticist Only

## 2021-07-18 NOTE — Progress Notes (Signed)
HEART & VASCULAR TRANSITION OF CARE CONSULT NOTE     Referring Physician: Dr Maryfrances Bunnellanford  [facility] Primary Cardiologist: Dr Jomarie Longsroituru  Spanish Interpreter present for clinic visit.    HPI: Referred to clinic by Dr Maryfrances Bunnellanford  for heart failure consultation.   Ms Nicole Parker is a spanish speaking 41 year old with a history of HTN, DM, thoracic aneurysm  4.2 on CTA, and combined heart failure preserved/reduced EF.   Admitted 07/03/21 with  A/C combined HFrEF and hypertensive emergency. Renal  US- no renal stenosis.  Echo EF 40-45% Grade II DD. Diuresed with IV lasix. Started on GDMT. [facility]. Discahrged 07/06/2021.   Overall feeling fine. A little short of breath with steps but has been able to work. Denies PND/Orthopnea. Appetite ok. No fever or chills. She is not on birth control. Her husband uses condoms. Weight at home 230-233 pounds. Works at [employer]. Taking all medications. Lives with her husband and 3 children ages 2914, 311, and 627. She is not a [place] citizen.   Cardiac Testing  Echo EF 40-45% Grade II DD and RV normal.  Review of Systems: [y] = yes, [ ]  = no   General: Weight gain [ ] ; Weight loss [ ] ; Anorexia [ ] ; Fatigue [ ] ; Fever [ ] ; Chills [ ] ; Weakness [ ]   Cardiac: Chest pain/pressure [ ] ; Resting SOB [ ] ; Exertional SOB [ Y]; Orthopnea [ ] ; Pedal Edema [ ] ; Palpitations [ ] ; Syncope [ ] ; Presyncope [ ] ; Paroxysmal nocturnal dyspnea[ ]   Pulmonary: Cough [ ] ; Wheezing[ ] ; Hemoptysis[ ] ; Sputum [ ] ; Snoring [ ]   GI: Vomiting[ ] ; Dysphagia[ ] ; Melena[ ] ; Hematochezia [ ] ; Heartburn[ ] ; Abdominal pain [ ] ; Constipation [ ] ; Diarrhea [ ] ; BRBPR [ ]   GU: Hematuria[ ] ; Dysuria [ ] ; Nocturia[ ]   Vascular: Pain in legs with walking [ ] ; Pain in feet with lying flat [ ] ; Non-healing sores [ ] ; Stroke [ ] ; TIA [ ] ; Slurred speech [ ] ;  Neuro: Headaches[ ] ; Vertigo[ ] ; Seizures[ ] ; Paresthesias[ ] ;Blurred vision [ ] ;  Diplopia [ ] ; Vision changes [ ]   Ortho/Skin: Arthritis [ ] ; Joint pain [Y ]; Muscle pain [ ] ; Joint swelling [ ] ; Back Pain [ ] ; Rash [ ]   Psych: Depression[ ] ; Anxiety[ ]   Heme: Bleeding problems [ ] ; Clotting disorders [ ] ; Anemia [ ]   Endocrine: Diabetes [ Y]; Thyroid dysfunction[ ]    Past Medical History:  Diagnosis Date   Gestational diabetes    Hypertension    Pregnancy induced hypertension     Current Outpatient Medications  Medication Sig Dispense Refill   carvedilol (COREG) 6.25 MG tablet Take 1 tablet (6.25 mg total) by mouth 2 (two) times daily with a meal. 60 tablet 6   furosemide (LASIX) 20 MG tablet Take 1 tablet (20 mg total) by mouth daily. 30 tablet 6   losartan (COZAAR) 25 MG tablet Take 1 tablet (25 mg total) by mouth daily. 30 tablet 6   metFORMIN (GLUCOPHAGE) 500 MG tablet Take 2 tablets (1,000 mg total) by mouth 2 (two) times daily with a meal. 120 tablet 3   potassium chloride (KLOR-CON M) 10 MEQ tablet Take 1 tablet (10 mEq total) by mouth at bedtime. 30 tablet 6   spironolactone (ALDACTONE) 25 MG tablet Take 1 tablet (25 mg total) by mouth daily. 30 tablet 6   No current facility-administered medications for this encounter.    No Known  Allergies    Social History   Socioeconomic History   Marital status: Married    Spouse name: Not on file   Number of children: 4   Years of education: 9 th grade   Highest education level: Not on file  Occupational History    Comment: employed  Tobacco Use   Smoking status: Never   Smokeless tobacco: Never  Vaping Use   Vaping Use: Never used  Substance and Sexual Activity   Alcohol use: No   Drug use: No   Sexual activity: Yes    Birth control/protection: None  Other Topics Concern   Not on file  Social History Narrative   She is able to read in spanish   Social Determinants of Health   Financial Resource Strain: Low Risk  (07/05/2021)   Overall Financial Resource Strain (CARDIA)    Difficulty of  Paying Living Expenses: Not very hard  Food Insecurity: No Food Insecurity (07/05/2021)   Hunger Vital Sign    Worried About Running Out of Food in the Last Year: Never true    Ran Out of Food in the Last Year: Never true  Transportation Needs: No Transportation Needs (07/05/2021)   PRAPARE - Hydrologist (Medical): No    Lack of Transportation (Non-Medical): No  Physical Activity: Not on file  Stress: Not on file  Social Connections: Not on file  Intimate Partner Violence: Not on file     No family history on file.  Vitals:   07/18/21 1401  BP: (!) 140/100  Pulse: 66  SpO2: 97%  Weight: 107.3 kg (236 lb 9.6 oz)   Wt Readings from Last 3 Encounters:  07/18/21 107.3 kg (236 lb 9.6 oz)  07/12/21 106.2 kg (234 lb 3.2 oz)  07/06/21 108.7 kg (239 lb 10.2 oz)     PHYSICAL EXAM: General:  Well appearing. No respiratory difficulty HEENT: normal Neck: supple. no JVD. Carotids 2+ bilat; no bruits. No lymphadenopathy or thryomegaly appreciated. Cor: PMI nondisplaced. Regular rate & rhythm. No rubs, gallops or murmurs. Lungs: clear Abdomen: soft, nontender, nondistended. No hepatosplenomegaly. No bruits or masses. Good bowel sounds. Extremities: no cyanosis, clubbing, rash, edema Neuro: alert & oriented x 3, cranial nerves grossly intact. moves all 4 extremities w/o difficulty. Affect pleasant.   ASSESSMENT & PLAN: 1. HF Combined Systolic/Diastolic.  Echo EF 40-45% Grade IIDD. RV normal.  NYHA II GDMT  Diuretic-Volume status stable. Stop lasix with addition of farxiga.  BB- Continue carvedilol 6.25 mg twice a day  Ace/ARB/ARNI- Increase losartan to 50 mg daily. Unable to start entresto b/c she is not a [place] citizen and has just started working.   MRA SGLT2i- Start farxiga  10 mg daily. Discussed possible UTI/yeast infections and that she should stop if this occurs.  - Discussed avoiding pregnancy on HF medications. She is currently not on oral  contraceptives but uses condoms.  - Check BMET  - Check ECHO 3 months.  2. HTN -Elevated.  - Renal US negative for RAS.  - Increasing losartan as above.   3. Thoracic Aneurysm  -07/03/21 4.1 cm in diameter  4. DMII -07/04/21 Hgb A1C 8 - Adding SGLT2i.  - Needs f/u with PCP   Referred to HFSW (PCP, Medications, Insurance, Financial ): Yes  Refer to Pharmacy: Yes  Refer to Home Health:  No Refer to Advanced Heart Failure Clinic: No  Refer to General Cardiology:  She has f/u next week.    Follow up as  needed.   Maaliyah Adolph NP-C 4:47 PM

## 2021-07-18 NOTE — Patient Instructions (Signed)
  Deje de lasix (furosemida)  Empezar Farxgia 10 mg diarios  Aumente losartn a 50 mg al da.  Laboratorios realizados hoy, sus resultados estarn disponibles en MyChart, nos pondremos en contacto con usted para lecturas anormales.  Gracias por permitirnos brindarle atencin para la insuficiencia cardaca despus de su hospitalizacin reciente. Haga un seguimiento con el Dr. Lalla Brothers segn lo programado.  Si tiene [place] pregunta, problema o inquietud antes de su prxima cita, llame a nuestra oficina al [phone], opt. 2 y deje un mensaje para la enfermera de triaje.

## 2021-07-18 NOTE — Telephone Encounter (Signed)
Completed patient-portion of Farxiga patient assistance application. Faxed to [place] office [phone] for completion of provider section prior to submitting.  30-day free trial card of Wilder Glade provided in clinic.  Kerby Nora, PharmD, BCPS Heart Failure Stewardship Pharmacist Phone [phone]

## 2021-07-20 ENCOUNTER — Ambulatory Visit (HOSPITAL_BASED_OUTPATIENT_CLINIC_OR_DEPARTMENT_OTHER): Payer: Self-pay | Admitting: Nurse Practitioner

## 2021-08-01 ENCOUNTER — Ambulatory Visit: Payer: Self-pay | Admitting: Physician Assistant

## 2021-08-01 NOTE — Progress Notes (Signed)
Cardiology Clinic Note   Patient Name: Nicole Parker Date of Encounter: 08/03/2021  Primary Care Provider:  Ivonne Andrew, NP Primary Cardiologist:  Nicole Fair, MD  Patient Profile    41 y.o. F who presents today with interpreter as she does not speak [place], (speaks [place]) with untreated HTN, DM, chronic combined CHF, Echo showed new EF 40-45% with grade II DD and RWMA anterolaterally and normal valves, Nonalcoholic fatty liver, thoracic aortic aneurysm, with renal ultrasound to rule out renal artery stenosis, and CT coronary angiogram that ruled out atherosclerosis.s/p discharge for decompensated CHF, HTN, and uncontrolled diabetes.   Since being seen in the hospital she has followed up with heart failure clinic and has had Farxiga 10 mg tablets added to her medication regimen and Lasix was discontinued.  She was to continue spironolactone 25 mg daily carvedilol 6.25 mg twice daily.  Losartan was decreased to 25 mg daily.  Higher doses caused significant right arm pain.  She has had no further complaints of right arm pain with the lower dose of losartan.  Most recent labs were drawn on 07/18/2021 with a potassium of 4.6.  She is without complaint today.  She is having trouble getting [name] and has applied for medication assistance.  Past Medical History    Past Medical History:  Diagnosis Date   Gestational diabetes    Hypertension    Pregnancy induced hypertension    Past Surgical History:  Procedure Laterality Date   NO PAST SURGERIES     NO PAST SURGERIES      Allergies  No Known Allergies  History of Present Illness    Nicole Parker comes today s/p discharge after admission for A/C combined CHF, untreated HTN, and diabetes.  Amlodipine was stopped and she was started on Coreg, losartan, spironolactone, and furosemide.Coronary CTA was completed with a calcium score of 0.    Home Medications    Current Outpatient Medications  Medication Sig  Dispense Refill   dapagliflozin propanediol (FARXIGA) 10 MG TABS tablet Take 1 tablet (10 mg total) by mouth daily before breakfast. 28 tablet 0   losartan (COZAAR) 25 MG tablet Take 1 tablet (25 mg total) by mouth daily. 90 tablet 3   metFORMIN (GLUCOPHAGE) 500 MG tablet Take 2 tablets (1,000 mg total) by mouth 2 (two) times daily with a meal. 120 tablet 3   potassium chloride (KLOR-CON M) 10 MEQ tablet Take 1 tablet (10 mEq total) by mouth at bedtime. 30 tablet 6   carvedilol (COREG) 6.25 MG tablet Take 1 tablet (6.25 mg total) by mouth 2 (two) times daily with a meal. 60 tablet 6   spironolactone (ALDACTONE) 25 MG tablet Take 1 tablet (25 mg total) by mouth daily. 30 tablet 6   No current facility-administered medications for this visit.     Family History    No family history on file. She indicated that her mother is alive. She indicated that her father is alive.  Social History    Social History   Socioeconomic History   Marital status: Married    Spouse name: Not on file   Number of children: 4   Years of education: 9 th grade   Highest education level: Not on file  Occupational History    Comment: employed  Tobacco Use   Smoking status: Never   Smokeless tobacco: Never  Vaping Use   Vaping Use: Never used  Substance and Sexual Activity   Alcohol use: No   Drug use: No  Sexual activity: Yes    Birth control/protection: None  Other Topics Concern   Not on file  Social History Narrative   She is able to read in spanish   Social Determinants of Health   Financial Resource Strain: Low Risk  (07/05/2021)   Overall Financial Resource Strain (CARDIA)    Difficulty of Paying Living Expenses: Not very hard  Food Insecurity: No Food Insecurity (07/05/2021)   Hunger Vital Sign    Worried About Running Out of Food in the Last Year: Never true    Ran Out of Food in the Last Year: Never true  Transportation Needs: No Transportation Needs (07/05/2021)   PRAPARE - Therapist, art (Medical): No    Lack of Transportation (Non-Medical): No  Physical Activity: Not on file  Stress: Not on file  Social Connections: Not on file  Intimate Partner Violence: Not on file     Review of Systems    General:  No chills, fever, night sweats or weight changes.  Cardiovascular:  No chest pain, dyspnea on exertion, edema, orthopnea, palpitations, paroxysmal nocturnal dyspnea. Dermatological: No rash, lesions/masses Respiratory: No cough, dyspnea Urologic: No hematuria, dysuria Abdominal:   No nausea, vomiting, diarrhea, bright red blood per rectum, melena, or hematemesis Neurologic:  No visual changes, wkns, changes in mental status. All other systems reviewed and are otherwise negative except as noted above.     Physical Exam    VS:  BP 130/90 (BP Location: Left Arm)   Pulse 76   Ht 5\' 5"  (1.651 m)   Wt 236 lb 6.4 oz (107.2 kg)   SpO2 96%   BMI 39.34 kg/m  , BMI Body mass index is 39.34 kg/m.     GEN: Well nourished, well developed, in no acute distress. HEENT: normal. Neck: Supple, no JVD, carotid bruits, or masses. Cardiac: RRR, no murmurs, rubs, or gallops. No clubbing, cyanosis, edema.  Radials/DP/PT 2+ and equal bilaterally.  Respiratory:  Respirations regular and unlabored, clear to auscultation bilaterally. GI: Soft, nontender, nondistended, BS + x 4. MS: no deformity or atrophy. Skin: warm and dry, no rash. Neuro:  Strength and sensation are intact. Psych: Normal affect.  Accessory Clinical Findings      Lab Results  Component Value Date   WBC 8.2 07/04/2021   HGB 13.8 07/04/2021   HCT 40.6 07/04/2021   MCV 81.7 07/04/2021   PLT 269 07/04/2021   Lab Results  Component Value Date   CREATININE 0.70 07/18/2021   BUN 17 07/18/2021   NA 137 07/18/2021   K 4.6 07/18/2021   CL 102 07/18/2021   CO2 27 07/18/2021   Lab Results  Component Value Date   ALT 26 07/03/2021   AST 24 07/03/2021   ALKPHOS 67 07/03/2021    BILITOT 0.5 07/03/2021   Lab Results  Component Value Date   CHOL 184 07/04/2021   HDL 35 (L) 07/04/2021   LDLCALC 98 07/04/2021   TRIG 255 (H) 07/04/2021   CHOLHDL 5.3 07/04/2021    Lab Results  Component Value Date   HGBA1C 8.0 (H) 07/04/2021    Review of Prior Studies: Coronary CTA  1. Calcium score 0   2.  Normal right dominant coronary arteries   3.  Aortic root aneurysm 4.1 cm  Assessment & Plan   Hypertension: Medications have been adjusted to decrease losartan to 25 mg daily from 50 mg daily as she was unable to tolerate higher doses without having significant right arm  pain which is not a usual side effect.  But for her that this is very prominent and therefore would not recommend increasing losartan any higher than current dose.  2.  Chronic combined congestive heart failure: Being followed by advanced heart failure clinic.  Has seen Tonye Becket, NP, started on Farxiga 10 mg daily with Lasix discontinued.  She remains on spironolactone and carvedilol 6.25 mg twice daily.  She is not a candidate for Entresto due to cost.  This may be revisited on follow-up with advanced heart failure clinic.  3.  Type 2 diabetes: Remains on metformin, followed by primary care.  4.  History of thoracic aortic aneurysm: This was an incidental finding on coronary CTA.  Size 4.0 cm.  Ongoing surveillance annually.  Unless she is symptomatic  Current medicines are reviewed at length with the patient today.  I have spent 40 min's  dedicated to the care of this patient on the date of this encounter to include pre-visit review of records, assessment, management and diagnostic testing,with shared decision making. Signed, Bettey Mare. Liborio Nixon, ANP, AACC   08/03/2021 5:04 PM    [facility] [address] Office [phone] Fax [phone]  Notice: This dictation was prepared with Dragon dictation along with smaller phrase technology. Any transcriptional  errors that result from this process are unintentional and may not be corrected upon review.

## 2021-08-03 ENCOUNTER — Ambulatory Visit (INDEPENDENT_AMBULATORY_CARE_PROVIDER_SITE_OTHER): Payer: Self-pay | Admitting: Adult Health

## 2021-08-03 ENCOUNTER — Encounter: Payer: Self-pay | Admitting: Adult Health

## 2021-08-03 VITALS — BP 130/90 | HR 76 | Ht 65.0 in | Wt 236.4 lb

## 2021-08-03 DIAGNOSIS — I5042 Chronic combined systolic (congestive) and diastolic (congestive) heart failure: Secondary | ICD-10-CM

## 2021-08-03 DIAGNOSIS — K760 Fatty (change of) liver, not elsewhere classified: Secondary | ICD-10-CM

## 2021-08-03 DIAGNOSIS — E119 Type 2 diabetes mellitus without complications: Secondary | ICD-10-CM

## 2021-08-03 DIAGNOSIS — I7121 Aneurysm of the ascending aorta, without rupture: Secondary | ICD-10-CM

## 2021-08-03 DIAGNOSIS — I10 Essential (primary) hypertension: Secondary | ICD-10-CM

## 2021-08-03 MED ORDER — LOSARTAN POTASSIUM 25 MG PO TABS: 25.0000 mg | ORAL_TABLET | Freq: Every day | ORAL | 3 refills | Status: DC

## 2021-08-03 MED ORDER — DAPAGLIFLOZIN PROPANEDIOL 10 MG PO TABS: 10.0000 mg | ORAL_TABLET | Freq: Every day | ORAL | 0 refills | Status: DC

## 2021-08-03 MED ORDER — SPIRONOLACTONE 25 MG PO TABS: 25.0000 mg | ORAL_TABLET | Freq: Every day | ORAL | 6 refills | Status: DC

## 2021-08-03 MED ORDER — CARVEDILOL 6.25 MG PO TABS: 6.2500 mg | ORAL_TABLET | Freq: Two times a day (BID) | ORAL | 6 refills | Status: DC

## 2021-08-03 NOTE — Patient Instructions (Signed)
Medication Instructions:  No Changes *If you need a refill on your cardiac medications before your next appointment, please call your pharmacy*   Lab Work: No labs If you have labs (blood work) drawn today and your tests are completely normal, you will receive your results only by: MyChart Message (if you have MyChart) OR A paper copy in the mail If you have any lab test that is abnormal or we need to change your treatment, we will call you to review the results.   Testing/Procedures: No Testing   Follow-Up: At [facility], you and your health needs are our priority.  As part of our continuing mission to provide you with exceptional heart care, we have created designated Provider Care Teams.  These Care Teams include your primary Cardiologist (physician) and Advanced Practice Providers (APPs -  Physician Assistants and Nurse Practitioners) who all work together to provide you with the care you need, when you need it.  We recommend signing up for the patient portal called "MyChart".  Sign up information is provided on this After Visit Summary.  MyChart is used to connect with patients for Virtual Visits (Telemedicine).  Patients are able to view lab/test results, encounter notes, upcoming appointments, etc.  Non-urgent messages can be sent to your provider as well.   To learn more about what you can do with MyChart, go to [email or web address].    Your next appointment:   3 month(s)  The format for your next appointment:   In Person  Provider:   Joni Reining, DNP, ANP    Then, Thurmon Fair, MD will plan to see you again in 6 month(s).     Important Information About Sugar

## 2021-08-08 ENCOUNTER — Telehealth: Payer: Self-pay | Admitting: Licensed Clinical Social Worker

## 2021-08-08 ENCOUNTER — Telehealth: Payer: Self-pay | Admitting: *Deleted

## 2021-08-08 NOTE — Telephone Encounter (Unsigned)
H&V Care Navigation CSW Progress Note  Clinical Social Worker  mailed pt assistance applications . She had been seen at [facility] clinic but appears she wasn't assessed by LCSW. Pt had completed [name] app, I f/u with Misty Stanley, RN, regarding application. p  Patient is participating in a Managed Medicaid Plan:  No, self pay only.   SDOH Screenings   Alcohol Screen: Low Risk  (07/05/2021)   Alcohol Screen    Last Alcohol Screening Score (AUDIT): 0  Depression (PHQ2-9): Not on file  Financial Resource Strain: Low Risk  (07/05/2021)   Overall Financial Resource Strain (CARDIA)    Difficulty of Paying Living Expenses: Not very hard  Food Insecurity: No Food Insecurity (07/05/2021)   Hunger Vital Sign    Worried About Running Out of Food in the Last Year: Never true    Ran Out of Food in the Last Year: Never true  Housing: Low Risk  (07/05/2021)   Housing    Last Housing Risk Score: 0  Physical Activity: Not on file  Social Connections: Not on file  Stress: Not on file  Tobacco Use: Low Risk  (08/03/2021)   Patient History    Smoking Tobacco Use: Never    Smokeless Tobacco Use: Never    Passive Exposure: Not on file  Transportation Needs: No Transportation Needs (07/05/2021)   PRAPARE - Transportation    Lack of Transportation (Medical): No    Lack of Transportation (Non-Medical): No   [name], MSW, LCSW Clinical Social Worker II [facility] Heart/Vascular Care Navigation  [phone]- work cell phone (preferred) [phone]- desk phone

## 2021-08-08 NOTE — Telephone Encounter (Signed)
[name] assistance forms have been faxed.

## 2021-08-15 ENCOUNTER — Telehealth: Payer: Self-pay | Admitting: Licensed Clinical Social Worker

## 2021-08-15 NOTE — Progress Notes (Signed)
Heart and Vascular Care Navigation  08/15/2021  Nicole Parker 02-10-1980 [id number]  Reason for Referral:  Patient is participating in a Managed Medicaid Plan: No, self pay only  Engaged with patient by telephone for follow up visit for Heart and Vascular Care Coordination.                                                                                                   Assessment:      LCSW was able to reach pt via telephone today at [phone] with the assistance of [employer] [phone]). Introduced self, role, reason for call. Pt will check for mail but in the meantime I was able to assess for SDOH needs. Pt and pt spouse both work full time, they are not currently documented, she denies current financial concerns related to food, transportation or utilities/rent. She does have concerns about medical bills and medications. She is aware that clinic RN has sent in [place] application and that they will contact her if any issues/she should reach out to clinic if running low or hasnt heard from them after a bit.   Pt agreeable to me f/u next week. No additional questions/concerns at this time.                                    HRT/VAS Care Coordination     Patients Home Cardiology Office [address]   Outpatient Care Team Social Worker   Social Worker Name: Nile Riggs, [facility] [address] [phone]   Living arrangements for the past 2 months Single Family Home   Lives with: Spouse   Patient Current Insurance Coverage Self-Pay   Patient Has Concern With Paying Medical Bills Yes   Patient Concerns With Medical Bills no coverage, ongoing medical work up   Medical Bill Referrals: CAFA/[place] Card   Does Patient Have Prescription Coverage? No   Patient Prescription Assistance Programs [place] [facility]; Patient Assistance Programs   Home Assistive Devices/Equipment None   DME Agency NA   [facility] Agency NA       Social History:                                                                              SDOH Screenings   Alcohol Screen: Low Risk  (07/05/2021)   Alcohol Screen    Last Alcohol Screening Score (AUDIT): 0  Depression (PHQ2-9): Not on file  Financial Resource Strain: Medium Risk (08/15/2021)   Overall Financial Resource Strain (CARDIA)    Difficulty of Paying Living Expenses: Somewhat hard  Food Insecurity: No Food Insecurity (08/15/2021)   Hunger Vital Sign    Worried About Running Out of Food in the Last Year: Never true    Ran  Out of Food in the Last Year: Never true  Housing: Low Risk  (07/05/2021)   Housing    Last Housing Risk Score: 0  Physical Activity: Not on file  Social Connections: Not on file  Stress: Not on file  Tobacco Use: Low Risk  (08/03/2021)   Patient History    Smoking Tobacco Use: Never    Smokeless Tobacco Use: Never    Passive Exposure: Not on file  Transportation Needs: No Transportation Needs (08/15/2021)   PRAPARE - Transportation    Lack of Transportation (Medical): No    Lack of Transportation (Non-Medical): No    SDOH Interventions: Financial Resources:  Financial Strain Interventions: Other (Comment) (CAFA; Orange Card; [place]ist; [place] and [place] PAP) Editor, commissioning for [employer] Program  Food Insecurity:  Food Insecurity Interventions: Intervention Not Indicated  Housing Insecurity:  Housing Interventions: Intervention Not Indicated  Transportation:   Transportation Interventions: Intervention Not Indicated    Other Care Navigation Interventions:     Provided Pharmacy assistance resources [place] [facility], Patient Assistance Programs    Follow-up plan:   LCSW had mailed [employer], CAFA, [employer] and my card. I will f/u with pt next week to see if received/answer any additional questions. Pt requests I call post 2pm as she is at work in the morning. I will do so with interpreter.

## 2021-08-15 NOTE — Telephone Encounter (Signed)
Nicole Parker approved through 08/2022

## 2021-08-29 ENCOUNTER — Telehealth: Payer: Self-pay | Admitting: Licensed Clinical Social Worker

## 2021-08-29 NOTE — Telephone Encounter (Signed)
H&V Care Navigation CSW Progress Note  Clinical Social Worker contacted patient by phone to f/u on assistance applications. Assisted by [employer] ([employer] [phone]). Pt confirmed received, she is working on them and will bring to [employer] office when complete. Pt also aware she was approved for Farxiga PAP and it was mailed to her, she is concerned if it will keep coming and I shared that if approved it should continue to come to her through 08/10/2022. Pt appreciative, understands she can call with questions and we can utilize telephonic interpreter if needed.   Patient is participating in a Managed Medicaid Plan:  No, self pay only  SDOH Screenings   Alcohol Screen: Low Risk  (07/05/2021)   Alcohol Screen    Last Alcohol Screening Score (AUDIT): 0  Depression (PHQ2-9): Not on file  Financial Resource Strain: Medium Risk (08/15/2021)   Overall Financial Resource Strain (CARDIA)    Difficulty of Paying Living Expenses: Somewhat hard  Food Insecurity: No Food Insecurity (08/15/2021)   Hunger Vital Sign    Worried About Running Out of Food in the Last Year: Never true    Ran Out of Food in the Last Year: Never true  Housing: Low Risk  (07/05/2021)   Housing    Last Housing Risk Score: 0  Physical Activity: Not on file  Social Connections: Not on file  Stress: Not on file  Tobacco Use: Low Risk  (08/03/2021)   Patient History    Smoking Tobacco Use: Never    Smokeless Tobacco Use: Never    Passive Exposure: Not on file  Transportation Needs: No Transportation Needs (08/15/2021)   PRAPARE - Transportation    Lack of Transportation (Medical): No    Lack of Transportation (Non-Medical): No   [name], MSW, LCSW Clinical Social Worker II [facility] Heart/Vascular Care Navigation  [phone]- work cell phone (preferred) [phone]- desk phone

## 2021-09-13 ENCOUNTER — Telehealth: Payer: Self-pay | Admitting: Licensed Clinical Social Worker

## 2021-09-13 NOTE — Telephone Encounter (Signed)
H&V Care Navigation CSW Progress Note  Clinical Social Worker contacted patient by phone to f/u on assistance applications.   LCSW received paperwork from pt after our conversation, unfortunately it is incomplete in several parts and includes no supporting documents. I will f/u with pt tomorrow regarding needed documents.   Patient is participating in a Managed Medicaid Plan:  No, self pay only.   SDOH Screenings   Alcohol Screen: Low Risk  (07/05/2021)   Alcohol Screen    Last Alcohol Screening Score (AUDIT): 0  Depression (PHQ2-9): Not on file  Financial Resource Strain: Medium Risk (08/15/2021)   Overall Financial Resource Strain (CARDIA)    Difficulty of Paying Living Expenses: Somewhat hard  Food Insecurity: No Food Insecurity (08/15/2021)   Hunger Vital Sign    Worried About Running Out of Food in the Last Year: Never true    Ran Out of Food in the Last Year: Never true  Housing: Low Risk  (07/05/2021)   Housing    Last Housing Risk Score: 0  Physical Activity: Not on file  Social Connections: Not on file  Stress: Not on file  Tobacco Use: Low Risk  (08/03/2021)   Patient History    Smoking Tobacco Use: Never    Smokeless Tobacco Use: Never    Passive Exposure: Not on file  Transportation Needs: No Transportation Needs (08/15/2021)   PRAPARE - Transportation    Lack of Transportation (Medical): No    Lack of Transportation (Non-Medical): No   [name], MSW, LCSW Clinical Social Worker II [facility] Heart/Vascular Care Navigation  [phone]- work cell phone (preferred) [phone]- desk phone

## 2021-09-14 ENCOUNTER — Telehealth: Payer: Self-pay | Admitting: Licensed Clinical Social Worker

## 2021-09-14 NOTE — Telephone Encounter (Signed)
H&V Care Navigation CSW Progress Note  Clinical Social Worker contacted patient by phone to f/u on missing documents with assistance of [employer] [phone]). Pt answered at [phone]. We discussed missing documents and what is needed. Pt thinks she should be able to collect these. I also texted them in Spanish to her number at her request. I remain available. Will f/u as able.   Patient is participating in a Managed Medicaid Plan:  No, self pay only.   SDOH Screenings   Alcohol Screen: Low Risk  (07/05/2021)   Alcohol Screen    Last Alcohol Screening Score (AUDIT): 0  Depression (PHQ2-9): Not on file  Financial Resource Strain: Medium Risk (08/15/2021)   Overall Financial Resource Strain (CARDIA)    Difficulty of Paying Living Expenses: Somewhat hard  Food Insecurity: No Food Insecurity (08/15/2021)   Hunger Vital Sign    Worried About Running Out of Food in the Last Year: Never true    Ran Out of Food in the Last Year: Never true  Housing: Low Risk  (07/05/2021)   Housing    Last Housing Risk Score: 0  Physical Activity: Not on file  Social Connections: Not on file  Stress: Not on file  Tobacco Use: Low Risk  (08/03/2021)   Patient History    Smoking Tobacco Use: Never    Smokeless Tobacco Use: Never    Passive Exposure: Not on file  Transportation Needs: No Transportation Needs (08/15/2021)   PRAPARE - Transportation    Lack of Transportation (Medical): No    Lack of Transportation (Non-Medical): No   [name], MSW, LCSW Clinical Social Worker II [facility] Heart/Vascular Care Navigation  [phone]- work cell phone (preferred) [phone]- desk phone

## 2021-09-27 ENCOUNTER — Telehealth: Payer: Self-pay | Admitting: Licensed Clinical Social Worker

## 2021-09-27 NOTE — Telephone Encounter (Signed)
H&V Care Navigation CSW Progress Note  Clinical Social Worker contacted patient by phone to f/u on assistance applications w/ assistance of [employer] [place], #[id number]. No answer at [phone]. Will reattempt as able.   Patient is participating in a Managed Medicaid Plan:  No, self pay only.  SDOH Screenings   Alcohol Screen: Low Risk  (07/05/2021)   Alcohol Screen    Last Alcohol Screening Score (AUDIT): 0  Depression (PHQ2-9): Not on file  Financial Resource Strain: Medium Risk (08/15/2021)   Overall Financial Resource Strain (CARDIA)    Difficulty of Paying Living Expenses: Somewhat hard  Food Insecurity: No Food Insecurity (08/15/2021)   Hunger Vital Sign    Worried About Running Out of Food in the Last Year: Never true    Ran Out of Food in the Last Year: Never true  Housing: Low Risk  (07/05/2021)   Housing    Last Housing Risk Score: 0  Physical Activity: Not on file  Social Connections: Not on file  Stress: Not on file  Tobacco Use: Low Risk  (08/03/2021)   Patient History    Smoking Tobacco Use: Never    Smokeless Tobacco Use: Never    Passive Exposure: Not on file  Transportation Needs: No Transportation Needs (08/15/2021)   PRAPARE - Transportation    Lack of Transportation (Medical): No    Lack of Transportation (Non-Medical): No   [name], MSW, LCSW Clinical Social Worker II [facility] Heart/Vascular Care Navigation  [phone]- work cell phone (preferred) [phone]- desk phone

## 2021-10-09 ENCOUNTER — Telehealth: Payer: Self-pay | Admitting: Licensed Clinical Social Worker

## 2021-10-09 NOTE — Telephone Encounter (Signed)
H&V Care Navigation CSW Progress Note  Clinical Social Worker contacted patient by phone to f/u on message received during my time off. Pt shared that she is still trying to get her paystubs as she lost her ride to pick them up. I inquired if her employer could mail her copies of the paystubs and LCSW offered for pt to mail copy of documents directly to me if she does not have a ride. I remain available.   Patient is participating in a Managed Medicaid Plan:  No, self pay only.   SDOH Screenings   Food Insecurity: No Food Insecurity (08/15/2021)  Housing: Low Risk  (07/05/2021)  Transportation Needs: No Transportation Needs (08/15/2021)  Alcohol Screen: Low Risk  (07/05/2021)  Financial Resource Strain: Medium Risk (08/15/2021)  Tobacco Use: Low Risk  (08/03/2021)    [name], MSW, LCSW Clinical Social Worker II [facility] Heart/Vascular Care Navigation  [phone]- work cell phone (preferred) [phone]- desk phone

## 2021-10-12 ENCOUNTER — Encounter: Payer: Self-pay | Admitting: Nurse Practitioner

## 2021-10-12 ENCOUNTER — Ambulatory Visit (INDEPENDENT_AMBULATORY_CARE_PROVIDER_SITE_OTHER): Payer: Self-pay | Admitting: Nurse Practitioner

## 2021-10-12 ENCOUNTER — Other Ambulatory Visit: Payer: Self-pay

## 2021-10-12 VITALS — BP 168/98 | HR 75 | Temp 98.1°F | Ht 63.0 in | Wt 236.2 lb

## 2021-10-12 DIAGNOSIS — I10 Essential (primary) hypertension: Secondary | ICD-10-CM

## 2021-10-12 DIAGNOSIS — M25551 Pain in right hip: Secondary | ICD-10-CM

## 2021-10-12 DIAGNOSIS — Z0000 Encounter for general adult medical examination without abnormal findings: Secondary | ICD-10-CM

## 2021-10-12 DIAGNOSIS — E119 Type 2 diabetes mellitus without complications: Secondary | ICD-10-CM

## 2021-10-12 LAB — POCT GLYCOSYLATED HEMOGLOBIN (HGB A1C)
HbA1c POC (<> result, manual entry): 7 % (ref 4.0–5.6)
HbA1c, POC (controlled diabetic range): 7 % (ref 0.0–7.0)
HbA1c, POC (prediabetic range): 7 % — AB (ref 5.7–6.4)
Hemoglobin A1C: 7 % — AB (ref 4.0–5.6)

## 2021-10-12 MED ORDER — CLONIDINE HCL 0.1 MG PO TABS: 0.1000 mg | ORAL_TABLET | Freq: Once | ORAL | Status: DC

## 2021-10-12 MED ORDER — KETOROLAC TROMETHAMINE 30 MG/ML IJ SOLN
30.0000 mg | Freq: Once | INTRAMUSCULAR | Status: AC
  Administered 2021-10-12: 30 mg via INTRAMUSCULAR

## 2021-10-12 MED ORDER — METFORMIN HCL 500 MG PO TABS: 1000.0000 mg | ORAL_TABLET | Freq: Two times a day (BID) | ORAL | 3 refills | Status: DC

## 2021-10-12 MED ORDER — CLONIDINE HCL 0.1 MG PO TABS
0.1000 mg | ORAL_TABLET | Freq: Once | ORAL | Status: AC
  Administered 2021-10-12: 0.1 mg via ORAL

## 2021-10-12 MED ORDER — LOSARTAN POTASSIUM 25 MG PO TABS: 25.0000 mg | ORAL_TABLET | Freq: Every day | ORAL | 2 refills | Status: DC

## 2021-10-12 NOTE — Progress Notes (Unsigned)
@Patient  ID: , female    DOB: [birth year], 41 y.o.   MRN: [id number]  Chief Complaint  Patient presents with   Diabetes    Pt is here for 3 month's DM, HTN follow up visit. Pt states she has pelvic, hip, leg and pain for the past 4 day's pt pain level is a 7.5    Referring provider: [id number], NP   HPI  41 year old female with history of hypertension, cushing, thoracic aortic aneurysm, fatty liver, diabetes.   Patient presents today for diabetes and hypertension follow-up.  Patient is noncompliant with medications. States that she could not afford  farxiga - there is a note in the chart that this was approved through assistance. Advised to call cardiology about this medication. She states that she ran out of her medications 2 months ago. Advised that she had refills available. Patient complains of right hip pain today that radiates down her right leg. Will give toradol. Denies f/c/s, n/v/d, hemoptysis, PND, leg swelling Denies chest pain or edema  Note: blood pressure elevated in office - clonidine given per protocol.    No Known Allergies  Immunization History  Administered Date(s) Administered   Tdap 11/03/2013    Past Medical History:  Diagnosis Date   Gestational diabetes    Hypertension    Pregnancy induced hypertension     Tobacco History: Social History   Tobacco Use  Smoking Status Never  Smokeless Tobacco Never   Counseling given: Not Answered   Outpatient Encounter Medications as of 10/12/2021  Medication Sig   [DISCONTINUED] losartan (COZAAR) 25 MG tablet Take 1 tablet (25 mg total) by mouth daily.   [DISCONTINUED] metFORMIN (GLUCOPHAGE) 500 MG tablet Take 2 tablets (1,000 mg total) by mouth 2 (two) times daily with a meal.   carvedilol (COREG) 6.25 MG tablet Take 1 tablet (6.25 mg total) by mouth 2 (two) times daily with a meal. (Patient not taking: Reported on 10/12/2021)   dapagliflozin propanediol (FARXIGA) 10 MG TABS tablet Take  1 tablet (10 mg total) by mouth daily before breakfast. (Patient not taking: Reported on 10/12/2021)   losartan (COZAAR) 25 MG tablet Take 1 tablet (25 mg total) by mouth daily.   metFORMIN (GLUCOPHAGE) 500 MG tablet Take 2 tablets (1,000 mg total) by mouth 2 (two) times daily with a meal.   potassium chloride (KLOR-CON M) 10 MEQ tablet Take 1 tablet (10 mEq total) by mouth at bedtime. (Patient not taking: Reported on 10/12/2021)   spironolactone (ALDACTONE) 25 MG tablet Take 1 tablet (25 mg total) by mouth daily. (Patient not taking: Reported on 10/12/2021)   [EXPIRED] ketorolac (TORADOL) 30 MG/ML injection 30 mg    [DISCONTINUED] cloNIDine (CATAPRES) tablet 0.1 mg    No facility-administered encounter medications on file as of 10/12/2021.     Review of Systems  Review of Systems  Constitutional: Negative.   HENT: Negative.    Cardiovascular: Negative.   Gastrointestinal: Negative.   Musculoskeletal:        Right hip pain  Allergic/Immunologic: Negative.   Neurological: Negative.   Psychiatric/Behavioral: Negative.         Physical Exam  BP (!) 168/98 (BP Location: Right Arm, Patient Position: Sitting, Cuff Size: Large)   Pulse 75   Temp 98.1 F (36.7 C)   Ht 5\' 3"  (1.6 m)   Wt 236 lb 4 oz (107.2 kg)   BMI 41.85 kg/m   Wt Readings from Last 5 Encounters:  10/12/21 236 lb 4  oz (107.2 kg)  08/03/21 236 lb 6.4 oz (107.2 kg)  07/18/21 236 lb 9.6 oz (107.3 kg)  07/12/21 234 lb 3.2 oz (106.2 kg)  07/06/21 239 lb 10.2 oz (108.7 kg)     Physical Exam Vitals and nursing note reviewed.  Constitutional:      General: She is not in acute distress.    Appearance: She is well-developed.  Cardiovascular:     Rate and Rhythm: Normal rate and regular rhythm.  Pulmonary:     Effort: Pulmonary effort is normal.     Breath sounds: Normal breath sounds.  Musculoskeletal:     Comments: Right hip pain - tender to palpation, good ROM.  Neurological:     Mental Status: She is alert and  oriented to person, place, and time.      Lab Results:  CBC    Component Value Date/Time   WBC 8.2 07/04/2021 0048   RBC 4.97 07/04/2021 0048   HGB 13.8 07/04/2021 0048   HGB 11.6 04/05/2019 1112   HCT 40.6 07/04/2021 0048   HCT 35.8 04/05/2019 1112   PLT 269 07/04/2021 0048   PLT 386 04/05/2019 1112   MCV 81.7 07/04/2021 0048   MCV 71 (L) 04/05/2019 1112   MCH 27.8 07/04/2021 0048   MCHC 34.0 07/04/2021 0048   RDW 13.0 07/04/2021 0048   RDW 15.7 (H) 04/05/2019 1112    BMET    Component Value Date/Time   NA 137 07/18/2021 1429   NA 138 04/05/2019 1112   K 4.6 07/18/2021 1429   CL 102 07/18/2021 1429   CO2 27 07/18/2021 1429   GLUCOSE 160 (H) 07/18/2021 1429   BUN 17 07/18/2021 1429   BUN 11 04/05/2019 1112   CREATININE 0.70 07/18/2021 1429   CALCIUM 9.0 07/18/2021 1429   GFRNONAA >60 07/18/2021 1429   GFRAA 121 04/05/2019 1112    BNP    Component Value Date/Time   BNP 105.8 (H) 07/03/2021 0641      Assessment & Plan:   Health care maintenance - POCT glycosylated hemoglobin (Hb A1C)  2. Primary hypertension  - CBC - Comprehensive metabolic panel  3. Type 2 diabetes mellitus without complication, without long-term current use of insulin (HCC)  - CBC - Comprehensive metabolic panel   4. Right hip pain  - ketorolac (TORADOL) 30 MG/ML injection 30 mg  AVS was discussed with patient. Patient was asked if there were any other questions or concerns today. Patient stated there were no other questions or concerns. Patient was advised that they needed lab work and then could check out and schedule follow up visit. Patient voiced understanding. Appointment was completed.   Patient states that all issues and concerns were addressed during visit today.    Follow up:  Follow up in 3 months   Patient Instructions  1. Health care maintenance  - POCT glycosylated hemoglobin (Hb A1C)  2. Primary hypertension  - CBC - Comprehensive metabolic  panel  3. Type 2 diabetes mellitus without complication, without long-term current use of insulin (HCC)  - CBC - Comprehensive metabolic panel   4. Right hip pain  - ketorolac (TORADOL) 30 MG/ML injection 30 mg  AVS was discussed with patient. Patient was asked if there were any other questions or concerns today. Patient stated there were no other questions or concerns. Patient was advised that they needed lab work and then could check out and schedule follow up visit. Patient voiced understanding. Appointment was completed.   Patient states  that all issues and concerns were addressed during visit today.    Follow up:  Follow up in 3 months    Citica Sciatica  La citica es el dolor, debilidad, hormigueo o prdida de la sensibilidad (adormecimiento) a lo largo del nervio citico. El nervio citico comienza en la parte inferior de la espalda y desciende por la parte posterior de cada pierna. La citica suele afectar un lado del cuerpo. Suele desaparecer por s sola o con tratamiento. A veces, la citica puede volver a manifestarse. Cules son las causas? Esta afeccin se produce cuando el nervio citico se comprime o se ejerce presin sobre l. Las causas de esto pueden ser las siguientes: Un disco que sobresale demasiado entre los huesos de la columna vertebral (hernia de disco). Cambios en los discos vertebrales debido al envejecimiento. Una afeccin en un msculo de las nalgas. Un crecimiento seo adicional cerca del nervio citico. Una rotura (fractura) de la zona que est entre los huesos de la cadera (pelvis). Embarazo. Tumor. Esto es poco frecuente. Qu incrementa el riesgo? Es ms probable que tengan esta afeccin las personas que: Software engineer deportes que ponen presin o tensin sobre la columna vertebral. Tienen poca fuerza y facilidad de movimiento (flexibilidad). Han tenido una lesin en la espalda o una ciruga de espalda. Permanecen sentadas durante largos  perodos. Realizan actividades que implican agacharse o levantar objetos una y [place]. Tienen mucho sobrepeso (son obesas). Cules son los signos o sntomas? Los sntomas pueden variar de leves a muy graves. Pueden incluir los siguientes: Cualquiera de los siguientes problemas en la parte inferior de la espalda, piernas, cadera o nalgas: Hormigueo leve, prdida de la sensibilidad o dolor sordo. Sensacin de ardor. Dolor agudo. Prdida de la sensibilidad en la parte posterior de la pantorrilla o la planta del pie. Debilidad en las piernas. Dolor muy intenso en la espalda que dificulta el movimiento. Estos sntomas pueden empeorar al toser, Engineering geologist o rer. Tambin pueden empeorar al sentarse o estar de pie durante largos perodos. Cmo se trata? A menudo, esta afeccin mejora sin tratamiento. Sin embargo, el tratamiento puede incluir: Multimedia programmer de [place] fsica o reducirla cuando siente dolor. Hacer ejercicio, incluido el fortalecimiento y Management consultant. Aplicar hielo o calor sobre la zona afectada. Administrar inyecciones de medicamentos para reducir Chief Technology Officer y la hinchazn, o para relajar los msculos. Ciruga. Siga estas instrucciones en su casa: Medicamentos Use los medicamentos de venta libre y los recetados solamente como se lo haya indicado el mdico. Pregunte al mdico si debe evitar conducir o Chemical engineer mquinas mientras toma los medicamentos. Control del dolor     Si se lo indican, aplique hielo en la zona afectada. Para hacer esto: Ponga el hielo en una bolsa plstica. Coloque una toalla entre la piel y Copy. Aplique el hielo durante 20 minutos, 2 a 3 veces por da. Si la piel se le pone de color rojo brillante, quite el hielo de inmediato para evitar daos en la piel. El riesgo de dao en la piel es mayor si no puede sentir dolor, calor o fro. Si se lo indican, aplique calor en la zona afectada. Hgalo con la frecuencia que le haya indicado el mdico. Use la  fuente de calor que el mdico le indique, por ejemplo, una compresa de calor hmedo o una almohadilla trmica. Coloque una toalla entre la piel y la fuente de Airline pilot. Aplique calor durante 20 a 30 minutos. Si la piel se le pone de color rojo brillante, retire Dance movement psychotherapist  calor de inmediato para evitar quemaduras. El riesgo de quemaduras es mayor si no puede sentir el dolor, el calor o el fro. Actividad  Retome sus actividades normales cuando el mdico le diga que es seguro. Evite las [employer] sntomas. Descanse por breves perodos Administrator. Cuando descanse durante perodos ms largos, haga alguna actividad fsica o un estiramiento entre los perodos de descanso. Evite estar sentado durante largos perodos sin moverse. Levntese y [place] al menos una vez cada hora. Haga suavemente los ejercicios que le indique el mdico. No levante ningn objeto que pese ms de 10 libras (4.5 kg). Aunque no tenga sntomas, evite levantar objetos pesados. Evite levantar objetos pesados de forma repetida. Al levantar objetos, hgalo siempre de una forma que sea segura para su cuerpo. Para esto, debe hacer lo siguiente: Flexione las rodillas. Mantenga el objeto cerca del cuerpo. No gire el cuerpo. Instrucciones generales Mantenga un peso saludable. Use calzado cmodo, que le d soporte al pie. Evite usar tacones. Evite dormir sobre un colchn que sea demasiado blando o demasiado duro. Es posible que sienta menos dolor si duerme en un colchn con apoyo suficientemente firme para la espalda. Comunquese con un mdico si: El dolor no se alivia con los [employer]. El dolor no mejora. El dolor [place]. Los sntomas duran ms de 4 semanas. Pierde peso sin proponrselo. Solicite ayuda de inmediato si: No puede controlar el pis (miccin) ni la evacuacin de la materia fecal (deposiciones). Tiene debilidad en alguna de estas zonas, y la debilidad empeora: La parte inferior de la espalda. La  zona que se encuentra entre los [employer] caderas. Las nalgas. Las piernas. Siente irritacin o inflamacin en la espalda. Tiene sensacin de ardor al [employer]. Resumen La citica es el dolor, debilidad, hormigueo o prdida de la sensibilidad (adormecimiento) a lo largo del nervio citico. Esto puede incluir la parte inferior de la espala, las piernas, las caderas y los glteos. Esta afeccin se produce cuando el nervio citico se comprime o se ejerce presin sobre l. El tratamiento a menudo incluye reposo, ejercicio, medicamentos y Contractor hielo o calor en la zona afectada. Esta informacin no tiene Theme park manager el consejo del mdico. Asegrese de hacerle al mdico cualquier pregunta que tenga. Document Revised: 05/29/2021 Document Reviewed: 05/29/2021 Elsevier Patient Education  [address]     Ivonne Andrew, [place] 10/15/2021

## 2021-10-12 NOTE — Patient Instructions (Addendum)
1. Health care maintenance  - POCT glycosylated hemoglobin (Hb A1C)  2. Primary hypertension  - CBC - Comprehensive metabolic panel  3. Type 2 diabetes mellitus without complication, without long-term current use of insulin (HCC)  - CBC - Comprehensive metabolic panel   4. Right hip pain  - ketorolac (TORADOL) 30 MG/ML injection 30 mg  AVS was discussed with patient. Patient was asked if there were any other questions or concerns today. Patient stated there were no other questions or concerns. Patient was advised that they needed lab work and then could check out and schedule follow up visit. Patient voiced understanding. Appointment was completed.   Patient states that all issues and concerns were addressed during visit today.    Follow up:  Follow up in 3 months    Citica Sciatica  La citica es el dolor, debilidad, hormigueo o prdida de la sensibilidad (adormecimiento) a lo largo del nervio citico. El nervio citico comienza en la parte inferior de la espalda y desciende por la parte posterior de cada pierna. La citica suele afectar un lado del cuerpo. Suele desaparecer por s sola o con tratamiento. A veces, la citica puede volver a manifestarse. Cules son las causas? Esta afeccin se produce cuando el nervio citico se comprime o se ejerce presin sobre l. Las causas de esto pueden ser las siguientes: Un disco que sobresale demasiado entre los huesos de la columna vertebral (hernia de disco). Cambios en los discos vertebrales debido al envejecimiento. Una afeccin en un msculo de las nalgas. Un crecimiento seo adicional cerca del nervio citico. Una rotura (fractura) de la zona que est entre los huesos de la cadera (pelvis). Embarazo. Tumor. Esto es poco frecuente. Qu incrementa el riesgo? Es ms probable que tengan esta afeccin las personas que: Software engineer deportes que ponen presin o tensin sobre la columna vertebral. Tienen poca fuerza y facilidad  de movimiento (flexibilidad). Han tenido una lesin en la espalda o una ciruga de espalda. Permanecen sentadas durante largos perodos. Realizan actividades que implican agacharse o levantar objetos una y [place]. Tienen mucho sobrepeso (son obesas). Cules son los signos o sntomas? Los sntomas pueden variar de leves a muy graves. Pueden incluir los siguientes: Cualquiera de los siguientes problemas en la parte inferior de la espalda, piernas, cadera o nalgas: Hormigueo leve, prdida de la sensibilidad o dolor sordo. Sensacin de ardor. Dolor agudo. Prdida de la sensibilidad en la parte posterior de la pantorrilla o la planta del pie. Debilidad en las piernas. Dolor muy intenso en la espalda que dificulta el movimiento. Estos sntomas pueden empeorar al toser, Engineering geologist o rer. Tambin pueden empeorar al sentarse o estar de pie durante largos perodos. Cmo se trata? A menudo, esta afeccin mejora sin tratamiento. Sin embargo, el tratamiento puede incluir: Multimedia programmer de [place] fsica o reducirla cuando siente dolor. Hacer ejercicio, incluido el fortalecimiento y Management consultant. Aplicar hielo o calor sobre la zona afectada. Administrar inyecciones de medicamentos para reducir Chief Technology Officer y la hinchazn, o para relajar los msculos. Ciruga. Siga estas instrucciones en su casa: Medicamentos Use los medicamentos de venta libre y los recetados solamente como se lo haya indicado el mdico. Pregunte al mdico si debe evitar conducir o Chemical engineer mquinas mientras toma los medicamentos. Control del dolor     Si se lo indican, aplique hielo en la zona afectada. Para hacer esto: Ponga el hielo en una bolsa plstica. Coloque una toalla entre la piel y Copy. Aplique el hielo durante 20 minutos, 2 a  3 veces por da. Si la piel se le pone de color rojo brillante, quite el hielo de inmediato para evitar daos en la piel. El riesgo de dao en la piel es mayor si no puede sentir dolor, calor  o fro. Si se lo indican, aplique calor en la zona afectada. Hgalo con la frecuencia que le haya indicado el mdico. Use la fuente de calor que el mdico le indique, por ejemplo, una compresa de calor hmedo o una almohadilla trmica. Coloque una toalla entre la piel y la fuente de Airline pilot. Aplique calor durante 20 a 30 minutos. Si la piel se le pone de color rojo brillante, retire Company secretary de inmediato para evitar quemaduras. El riesgo de quemaduras es mayor si no puede sentir el dolor, el calor o el fro. Actividad  Retome sus actividades normales cuando el mdico le diga que es seguro. Evite las [employer] sntomas. Descanse por breves perodos Administrator. Cuando descanse durante perodos ms largos, haga alguna actividad fsica o un estiramiento entre los perodos de descanso. Evite estar sentado durante largos perodos sin moverse. Levntese y [place] al menos una vez cada hora. Haga suavemente los ejercicios que le indique el mdico. No levante ningn objeto que pese ms de 10 libras (4.5 kg). Aunque no tenga sntomas, evite levantar objetos pesados. Evite levantar objetos pesados de forma repetida. Al levantar objetos, hgalo siempre de una forma que sea segura para su cuerpo. Para esto, debe hacer lo siguiente: Flexione las rodillas. Mantenga el objeto cerca del cuerpo. No gire el cuerpo. Instrucciones generales Mantenga un peso saludable. Use calzado cmodo, que le d soporte al pie. Evite usar tacones. Evite dormir sobre un colchn que sea demasiado blando o demasiado duro. Es posible que sienta menos dolor si duerme en un colchn con apoyo suficientemente firme para la espalda. Comunquese con un mdico si: El dolor no se alivia con los [employer]. El dolor no mejora. El dolor [place]. Los sntomas duran ms de 4 semanas. Pierde peso sin proponrselo. Solicite ayuda de inmediato si: No puede controlar el pis (miccin) ni la evacuacin de la materia  fecal (deposiciones). Tiene debilidad en alguna de estas zonas, y la debilidad empeora: La parte inferior de la espalda. La zona que se encuentra entre los [employer] caderas. Las nalgas. Las piernas. Siente irritacin o inflamacin en la espalda. Tiene sensacin de ardor al [employer]. Resumen La citica es el dolor, debilidad, hormigueo o prdida de la sensibilidad (adormecimiento) a lo largo del nervio citico. Esto puede incluir la parte inferior de la espala, las piernas, las caderas y los glteos. Esta afeccin se produce cuando el nervio citico se comprime o se ejerce presin sobre l. El tratamiento a menudo incluye reposo, ejercicio, medicamentos y Contractor hielo o calor en la zona afectada. Esta informacin no tiene Theme park manager el consejo del mdico. Asegrese de hacerle al mdico cualquier pregunta que tenga. Document Revised: 05/29/2021 Document Reviewed: 05/29/2021 Elsevier Patient Education  2023 [employer].

## 2021-10-15 ENCOUNTER — Encounter: Payer: Self-pay | Admitting: Nurse Practitioner

## 2021-10-15 DIAGNOSIS — Z0000 Encounter for general adult medical examination without abnormal findings: Secondary | ICD-10-CM | POA: Insufficient documentation

## 2021-10-15 NOTE — Assessment & Plan Note (Signed)
-   POCT glycosylated hemoglobin (Hb A1C)  2. Primary hypertension  - CBC - Comprehensive metabolic panel  3. Type 2 diabetes mellitus without complication, without long-term current use of insulin (HCC)  - CBC - Comprehensive metabolic panel   4. Right hip pain  - ketorolac (TORADOL) 30 MG/ML injection 30 mg  AVS was discussed with patient. Patient was asked if there were any other questions or concerns today. Patient stated there were no other questions or concerns. Patient was advised that they needed lab work and then could check out and schedule follow up visit. Patient voiced understanding. Appointment was completed.   Patient states that all issues and concerns were addressed during visit today.    Follow up:  Follow up in 3 months

## 2021-10-16 ENCOUNTER — Telehealth: Payer: Self-pay | Admitting: Pharmacist

## 2021-10-16 NOTE — Progress Notes (Signed)
Using [facility] [place], [id number], called patient, Contacted patient regarding referral for medication access and medication management from Ivonne Andrew, NP .   Left patient a voicemail to return my call at their convenience  Catie TClearance Coots, PharmD, [facility] [phone]

## 2021-10-18 LAB — CBC
Hematocrit: 37 % (ref 34.0–46.6)
Hemoglobin: 12.3 g/dL (ref 11.1–15.9)
MCH: 27.6 pg (ref 26.6–33.0)
MCHC: 33.2 g/dL (ref 31.5–35.7)
MCV: 83 fL (ref 79–97)
Platelets: 323 10*3/uL (ref 150–450)
RBC: 4.46 x10E6/uL (ref 3.77–5.28)
RDW: 13 % (ref 11.7–15.4)
WBC: 7.1 10*3/uL (ref 3.4–10.8)

## 2021-10-18 LAB — COMPREHENSIVE METABOLIC PANEL
ALT: 15 IU/L (ref 0–32)
AST: 17 IU/L (ref 0–40)
Albumin/Globulin Ratio: 1.3 (ref 1.2–2.2)
Albumin: 4.1 g/dL (ref 3.9–4.9)
Alkaline Phosphatase: 70 IU/L (ref 44–121)
BUN/Creatinine Ratio: 35 — ABNORMAL HIGH (ref 9–23)
BUN: 19 mg/dL (ref 6–24)
Bilirubin Total: 0.3 mg/dL (ref 0.0–1.2)
CO2: 19 mmol/L — ABNORMAL LOW (ref 20–29)
Calcium: 8.7 mg/dL (ref 8.7–10.2)
Chloride: 100 mmol/L (ref 96–106)
Creatinine, Ser: 0.55 mg/dL — ABNORMAL LOW (ref 0.57–1.00)
Globulin, Total: 3.1 g/dL (ref 1.5–4.5)
Glucose: 110 mg/dL — ABNORMAL HIGH (ref 70–99)
Sodium: 130 mmol/L — ABNORMAL LOW (ref 134–144)
Total Protein: 7.2 g/dL (ref 6.0–8.5)
eGFR: 118 mL/min/{1.73_m2} (ref >59)

## 2021-10-18 NOTE — Progress Notes (Unsigned)
Attempted to call patient to schedule appointment. Unable to leave voicemail.   Patient sees cardiology tomorrow. Per chart review, spironolactone, metformin, losartan, and carvedilol were filled 10/12/21.   Contacted [place] program to follow up on status of Farxiga shipment. Last order was shipped 10/11/2021 - [employer] - [phone]. Per [employer] website, order is estimated to be delivered by Friday, September 15th by 7 pm    Will notify cardiology.   Catie Eppie Gibson, PharmD, [facility] [phone]

## 2021-10-18 NOTE — Progress Notes (Deleted)
Cardiology Clinic Note   Patient Name: Nicole Parker Date of Encounter: 10/18/2021  Primary Care Provider:  Ivonne Andrew, NP Primary Cardiologist:  Thurmon Fair, MD  Patient Profile    41 year old female who requires interpretor (Spanish), with history of HTN, DM, Thoracic aortic aneurysm, (4.0 cm), Chronic combined CHF, Echo  07/04/2021, showed new EF 40-45% with grade II DD and RWMA anterolaterally and normal valves, Nonalcoholic fatty liver, thoracic aortic aneurysm, with renal ultrasound to rule out renal artery stenosis, and CT coronary angiogram that ruled out atherosclerosis. She is also seen by the [facility] and has been placed on Farxiga, spironolactone, and carvedilol. Last seen in the office on 08/03/2021. She complained of right arm pain with increased doses of losartan higher than 25 mg daily. She is not a candidate for Entresto due to cost. She is working with Child psychotherapist for assistance on medications through [employer]. Last seen in the office on 08/03/2021. No changes were made to her regimen.   Past Medical History    Past Medical History:  Diagnosis Date   Gestational diabetes    Hypertension    Pregnancy induced hypertension    Past Surgical History:  Procedure Laterality Date   NO PAST SURGERIES     NO PAST SURGERIES      Allergies  No Known Allergies  History of Present Illness    Mrs. Nicole Parker presents today for ongoing assessment and management of hypertension, combined CHF, with hx of thoracic aortic aneurysm.  Home Medications    Current Outpatient Medications  Medication Sig Dispense Refill   carvedilol (COREG) 6.25 MG tablet Take 1 tablet (6.25 mg total) by mouth 2 (two) times daily with a meal. (Patient not taking: Reported on 10/12/2021) 60 tablet 6   dapagliflozin propanediol (FARXIGA) 10 MG TABS tablet Take 1 tablet (10 mg total) by mouth daily before breakfast. (Patient not taking: Reported on  10/12/2021) 28 tablet 0   losartan (COZAAR) 25 MG tablet Take 1 tablet (25 mg total) by mouth daily. 30 tablet 2   metFORMIN (GLUCOPHAGE) 500 MG tablet Take 2 tablets (1,000 mg total) by mouth 2 (two) times daily with a meal. 120 tablet 3   potassium chloride (KLOR-CON M) 10 MEQ tablet Take 1 tablet (10 mEq total) by mouth at bedtime. (Patient not taking: Reported on 10/12/2021) 30 tablet 6   spironolactone (ALDACTONE) 25 MG tablet Take 1 tablet (25 mg total) by mouth daily. (Patient not taking: Reported on 10/12/2021) 30 tablet 6   No current facility-administered medications for this visit.     Family History    No family history on file. She indicated that her mother is alive. She indicated that her father is alive.  Social History    Social History   Socioeconomic History   Marital status: Married    Spouse name: Not on file   Number of children: 4   Years of education: 9 th grade   Highest education level: Not on file  Occupational History    Comment: employed  Tobacco Use   Smoking status: Never   Smokeless tobacco: Never  Vaping Use   Vaping Use: Never used  Substance and Sexual Activity   Alcohol use: No   Drug use: No   Sexual activity: Yes    Birth control/protection: None  Other Topics Concern   Not on file  Social History Narrative   Patient is able to read in spanish   Social  Determinants of Health   Financial Resource Strain: Medium Risk (08/15/2021)   Overall Financial Resource Strain (CARDIA)    Difficulty of Paying Living Expenses: Somewhat hard  Food Insecurity: No Food Insecurity (08/15/2021)   Hunger Vital Sign    Worried About Running Out of Food in the Last Year: Never true    Ran Out of Food in the Last Year: Never true  Transportation Needs: No Transportation Needs (08/15/2021)   PRAPARE - Administrator, Civil Service (Medical): No    Lack of Transportation (Non-Medical): No  Physical Activity: Not on file  Stress: Not on file  Social  Connections: Not on file  Intimate Partner Violence: Not on file     Review of Systems    General:  No chills, fever, night sweats or weight changes.  Cardiovascular:  No chest pain, dyspnea on exertion, edema, orthopnea, palpitations, paroxysmal nocturnal dyspnea. Dermatological: No rash, lesions/masses Respiratory: No cough, dyspnea Urologic: No hematuria, dysuria Abdominal:   No nausea, vomiting, diarrhea, bright red blood per rectum, melena, or hematemesis Neurologic:  No visual changes, wkns, changes in mental status. All other systems reviewed and are otherwise negative except as noted above.     Physical Exam    VS:  There were no vitals taken for this visit. , BMI There is no height or weight on file to calculate BMI.     GEN: Well nourished, well developed, in no acute distress. HEENT: normal. Neck: Supple, no JVD, carotid bruits, or masses. Cardiac: RRR, no murmurs, rubs, or gallops. No clubbing, cyanosis, edema.  Radials/DP/PT 2+ and equal bilaterally.  Respiratory:  Respirations regular and unlabored, clear to auscultation bilaterally. GI: Soft, nontender, nondistended, BS + x 4. MS: no deformity or atrophy. Skin: warm and dry, no rash. Neuro:  Strength and sensation are intact. Psych: Normal affect.  Accessory Clinical Findings    ECG personally reviewed by me today- *** - No acute changes  Lab Results  Component Value Date   WBC 7.1 10/15/2021   HGB 12.3 10/15/2021   HCT 37.0 10/15/2021   MCV 83 10/15/2021   PLT 323 10/15/2021   Lab Results  Component Value Date   CREATININE 0.55 (L) 10/15/2021   BUN 19 10/15/2021   NA 130 (L) 10/15/2021   K CANCELED 10/15/2021   CL 100 10/15/2021   CO2 19 (L) 10/15/2021   Lab Results  Component Value Date   ALT 15 10/15/2021   AST 17 10/15/2021   ALKPHOS 70 10/15/2021   BILITOT 0.3 10/15/2021   Lab Results  Component Value Date   CHOL 184 07/04/2021   HDL 35 (L) 07/04/2021   LDLCALC 98 07/04/2021   TRIG  255 (H) 07/04/2021   CHOLHDL 5.3 07/04/2021    Lab Results  Component Value Date   HGBA1C 7.0 (A) 10/12/2021   HGBA1C 7.0 10/12/2021   HGBA1C 7.0 (A) 10/12/2021   HGBA1C 7.0 10/12/2021    Review of Prior Studies: Echocardiogram 07/04/2021 1. Global hypokinesis worse in the anterolateral myocardium. Left  ventricular ejection fraction, by estimation, is 40 to 45%. The left  ventricle has mildly decreased function. The left ventricle demonstrates  regional wall motion abnormalities (see  scoring diagram/findings for description). There is moderate concentric  left ventricular hypertrophy. Left ventricular diastolic parameters are  consistent with Grade II diastolic dysfunction (pseudonormalization).  Elevated left ventricular end-diastolic  pressure. The average left ventricular global longitudinal strain is -11.7  %. The global longitudinal strain is abnormal.  2. Right ventricular systolic function is normal. The right ventricular  size is normal. There is normal pulmonary artery systolic pressure.   3. The mitral valve is normal in structure. No evidence of mitral valve  regurgitation. No evidence of mitral stenosis.   4. The aortic valve was not well visualized. Aortic valve regurgitation  is not visualized. No aortic stenosis is present.   5. Aortic dilatation noted. There is mild dilatation of the aortic root,  measuring 37 mm.   6. The inferior vena cava is normal in size with greater than 50%  respiratory variability, suggesting right atrial pressure of 3 mmHg.   Coronary CTA 07/06/2021 IMPRESSION: 1. Calcium score 0   2.  Normal right dominant coronary arteries   3.  Aortic root aneurysm 4.1 cm  Assessment & Plan   1.  ***    Current medicines are reviewed at length with the patient today.  I have spent *** min's  dedicated to the care of this patient on the date of this encounter to include pre-visit review of records, assessment, management and diagnostic  testing,with shared decision making. Signed, Bettey Mare. Liborio Nixon, ANP, AACC   10/18/2021 1:58 PM      Office [phone] Fax [phone]  Notice: This dictation was prepared with Dragon dictation along with smaller phrase technology. Any transcriptional errors that result from this process are unintentional and may not be corrected upon review.

## 2021-10-19 ENCOUNTER — Ambulatory Visit: Payer: Self-pay | Attending: Adult Health | Admitting: Adult Health

## 2021-10-22 NOTE — Progress Notes (Signed)
Contacted patient regarding referral for hypertension from Fenton Foy, NP . Called both her cell and listed home phone with interpreter, both stated they were invalid numbers.   Appears patient has previously collaborated with Chelsea. Will collaborate with her.    Catie Hedwig Morton, PharmD, [facility] [phone]

## 2021-10-25 ENCOUNTER — Telehealth: Payer: Self-pay | Admitting: Licensed Clinical Social Worker

## 2021-10-25 NOTE — Telephone Encounter (Signed)
H&V Care Navigation CSW Progress Note  Clinical Social Worker contacted patient by phone to f/u on assistance applications and missed cardiology appt. No answer despite three attempts to reach pt at listed numbers. LCSW w/ assistance of [employer] [phone]) left message requesting pt reschedule her missed appt on [phone]).  Patient is participating in a Managed Medicaid Plan:  No, self pay only.   SDOH Screenings   Food Insecurity: No Food Insecurity (08/15/2021)  Housing: Low Risk  (07/05/2021)  Transportation Needs: No Transportation Needs (08/15/2021)  Alcohol Screen: Low Risk  (07/05/2021)  Depression (PHQ2-9): Low Risk  (10/12/2021)  Financial Resource Strain: Medium Risk (08/15/2021)  Tobacco Use: Low Risk  (10/15/2021)    Westley Hummer, MSW, [place]  [phone]- work cell phone (preferred) [phone]- desk phone

## 2022-01-11 ENCOUNTER — Ambulatory Visit: Payer: Self-pay | Admitting: Nurse Practitioner

## 2022-02-07 ENCOUNTER — Telehealth: Payer: Self-pay | Admitting: Cardiovascular Disease

## 2022-02-07 ENCOUNTER — Ambulatory Visit: Payer: Self-pay | Attending: Cardiovascular Disease | Admitting: Cardiovascular Disease

## 2022-02-07 NOTE — Telephone Encounter (Signed)
Faxed MD portion of Farxiga PAP (prescription) to [facility] at [phone]

## 2022-02-22 ENCOUNTER — Other Ambulatory Visit: Payer: Self-pay | Admitting: Physician Assistant

## 2022-05-27 ENCOUNTER — Encounter (HOSPITAL_COMMUNITY): Payer: Self-pay | Admitting: *Deleted

## 2022-05-27 ENCOUNTER — Emergency Department (HOSPITAL_COMMUNITY)
Admission: EM | Admit: 2022-05-27 | Discharge: 2022-05-27 | Disposition: A | Discharge status: Home / Self Care | Payer: Self-pay | Attending: Emergency Medicine | Admitting: Emergency Medicine

## 2022-05-27 ENCOUNTER — Other Ambulatory Visit: Payer: Self-pay

## 2022-05-27 ENCOUNTER — Emergency Department (HOSPITAL_COMMUNITY): Payer: Self-pay

## 2022-05-27 DIAGNOSIS — E119 Type 2 diabetes mellitus without complications: Secondary | ICD-10-CM | POA: Insufficient documentation

## 2022-05-27 DIAGNOSIS — I10 Essential (primary) hypertension: Secondary | ICD-10-CM | POA: Insufficient documentation

## 2022-05-27 DIAGNOSIS — R0789 Other chest pain: Secondary | ICD-10-CM | POA: Insufficient documentation

## 2022-05-27 DIAGNOSIS — R0602 Shortness of breath: Secondary | ICD-10-CM | POA: Insufficient documentation

## 2022-05-27 LAB — COMPREHENSIVE METABOLIC PANEL
ALT: 21 U/L (ref 0–44)
AST: 18 U/L (ref 15–41)
Albumin: 3.3 g/dL — ABNORMAL LOW (ref 3.5–5.0)
Alkaline Phosphatase: 63 U/L (ref 38–126)
Anion gap: 12 (ref 5–15)
BUN: 18 mg/dL (ref 6–20)
CO2: 23 mmol/L (ref 22–32)
Calcium: 8.5 mg/dL — ABNORMAL LOW (ref 8.9–10.3)
Chloride: 98 mmol/L (ref 98–111)
Creatinine, Ser: 0.57 mg/dL (ref 0.44–1.00)
GFR, Estimated: >60 mL/min (ref >60)
Glucose, Bld: 192 mg/dL — ABNORMAL HIGH (ref 70–99)
Potassium: 3.5 mmol/L (ref 3.5–5.1)
Sodium: 133 mmol/L — ABNORMAL LOW (ref 135–145)
Total Bilirubin: 0.4 mg/dL (ref 0.3–1.2)
Total Protein: 7 g/dL (ref 6.5–8.1)

## 2022-05-27 LAB — URINALYSIS, ROUTINE W REFLEX MICROSCOPIC
Bilirubin Urine: NEGATIVE
Glucose, UA: >=500 mg/dL — AB
Hgb urine dipstick: NEGATIVE
Ketones, ur: NEGATIVE mg/dL
Leukocytes,Ua: NEGATIVE
Nitrite: NEGATIVE
Protein, ur: 30 mg/dL — AB
Specific Gravity, Urine: 1.026 (ref 1.005–1.030)
Squamous Epithelial / HPF: >50 /HPF (ref 0–5)
pH: 5 (ref 5.0–8.0)

## 2022-05-27 LAB — CBC WITH DIFFERENTIAL/PLATELET
Abs Immature Granulocytes: 0.03 10*3/uL (ref 0.00–0.07)
Basophils Absolute: 0 10*3/uL (ref 0.0–0.1)
Basophils Relative: 1 %
Eosinophils Absolute: 0.3 10*3/uL (ref 0.0–0.5)
Eosinophils Relative: 4 %
HCT: 35.7 % — ABNORMAL LOW (ref 36.0–46.0)
Hemoglobin: 10.8 g/dL — ABNORMAL LOW (ref 12.0–15.0)
Immature Granulocytes: 1 %
Lymphocytes Relative: 28 %
Lymphs Abs: 1.9 10*3/uL (ref 0.7–4.0)
MCH: 21.3 pg — ABNORMAL LOW (ref 26.0–34.0)
MCHC: 30.3 g/dL (ref 30.0–36.0)
MCV: 70.6 fL — ABNORMAL LOW (ref 80.0–100.0)
Monocytes Absolute: 0.4 10*3/uL (ref 0.1–1.0)
Monocytes Relative: 6 %
Neutro Abs: 4 10*3/uL (ref 1.7–7.7)
Neutrophils Relative %: 60 %
Platelets: 323 10*3/uL (ref 150–400)
RBC: 5.06 MIL/uL (ref 3.87–5.11)
RDW: 16.3 % — ABNORMAL HIGH (ref 11.5–15.5)
WBC: 6.6 10*3/uL (ref 4.0–10.5)
nRBC: 0 % (ref 0.0–0.2)

## 2022-05-27 LAB — D-DIMER, QUANTITATIVE: D-Dimer, Quant: <0.27 ug/mL-FEU (ref 0.00–0.50)

## 2022-05-27 LAB — PREGNANCY, URINE: Preg Test, Ur: NEGATIVE

## 2022-05-27 LAB — TROPONIN I (HIGH SENSITIVITY)
Troponin I (High Sensitivity): 4 ng/L (ref <18)
Troponin I (High Sensitivity): 4 ng/L (ref <18)

## 2022-05-27 LAB — BRAIN NATRIURETIC PEPTIDE: B Natriuretic Peptide: 44.6 pg/mL (ref 0.0–100.0)

## 2022-05-27 MED ORDER — HYDROCODONE-ACETAMINOPHEN 5-325 MG PO TABS
1.0000 | ORAL_TABLET | Freq: Once | ORAL | Status: AC
  Administered 2022-05-27: 1 via ORAL
  Filled 2022-05-27: qty 1

## 2022-05-27 MED ORDER — LOSARTAN POTASSIUM 50 MG PO TABS
25.0000 mg | ORAL_TABLET | Freq: Once | ORAL | Status: AC
Start: 2022-05-27 — End: 2022-05-27
  Administered 2022-05-27: 25 mg via ORAL
  Filled 2022-05-27: qty 1

## 2022-05-27 MED ORDER — METFORMIN HCL 1000 MG PO TABS: 1000.0000 mg | ORAL_TABLET | Freq: Two times a day (BID) | ORAL | 0 refills | Status: DC

## 2022-05-27 MED ORDER — KETOROLAC TROMETHAMINE 30 MG/ML IJ SOLN
30.0000 mg | Freq: Once | INTRAMUSCULAR | Status: AC
  Administered 2022-05-27: 30 mg via INTRAVENOUS
  Filled 2022-05-27: qty 1

## 2022-05-27 MED ORDER — LOSARTAN POTASSIUM 25 MG PO TABS: 25.0000 mg | ORAL_TABLET | Freq: Every day | ORAL | 0 refills | Status: DC

## 2022-05-27 MED ORDER — CARVEDILOL 3.125 MG PO TABS
6.2500 mg | ORAL_TABLET | Freq: Once | ORAL | Status: AC
  Administered 2022-05-27: 6.25 mg via ORAL
  Filled 2022-05-27: qty 2

## 2022-05-27 MED ORDER — KETOROLAC TROMETHAMINE 10 MG PO TABS: 10.0000 mg | ORAL_TABLET | Freq: Four times a day (QID) | ORAL | 0 refills | Status: DC | PRN

## 2022-05-27 MED ORDER — CARVEDILOL 6.25 MG PO TABS: 6.2500 mg | ORAL_TABLET | Freq: Two times a day (BID) | ORAL | 0 refills | Status: DC

## 2022-05-27 MED ORDER — SPIRONOLACTONE 12.5 MG HALF TABLET
25.0000 mg | ORAL_TABLET | Freq: Once | ORAL | Status: AC
  Administered 2022-05-27: 25 mg via ORAL
  Filled 2022-05-27: qty 2

## 2022-05-27 NOTE — ED Triage Notes (Signed)
Patient presents to ed c/o sob with back pain onset 2 wks ago  states pain is much  worse today , states pain is worse with inspiration patient is tearful interrupter line used.

## 2022-05-27 NOTE — ED Provider Triage Note (Signed)
Emergency Medicine Provider Triage Evaluation Note  Nicole Parker , a 42 y.o. female  was evaluated in triage.  Pt complains of SOB x 2 weeks, worse today. Symptoms associated with pain in her R mid back. Pain is aggravated by breathing. Reports compliance with her BP medications. No associated fever, anterior chest pain, orthopnea, hemoptysis, leg swelling, syncope.  Review of Systems  Positive: As above Negative: As above  Physical Exam  BP (!) 192/129 (BP Location: Right Arm)   Pulse 88   Temp 98.2 F (36.8 C)   Resp 18   SpO2 98%  Gen:   Awake, no distress   Resp:  Normal effort  MSK:   Moves extremities without difficulty  Other:  Teaful, anxious. Lungs CTAB.  Medical Decision Making  Medically screening exam initiated at 6:31 AM.  Appropriate orders placed.  [name] was informed that the remainder of the evaluation will be completed by another provider, this initial triage assessment does not replace that evaluation, and the importance of remaining in the ED until their evaluation is complete.  SOB - quite hypertensive in triage despite reported compliance with medications. Chart review suggest hx of noncompliance. Work up initiated.   Antony Madura, PA-C 05/27/22 [phone]

## 2022-05-27 NOTE — ED Provider Notes (Signed)
[place] EMERGENCY DEPARTMENT AT [facility] Provider Note   CSN: [id number] Arrival date & time: 05/27/22  5621     History  Chief Complaint  Patient presents with   Chest Pain   Flank Pain    Nicole Parker is a 42 y.o. female.  Pt is a 42 yo female with pmhx significant for htn and dm.  Pt has pain to her right mid back + SOB for 2 weeks.  She said it hurts with deep breaths.  Pt denies any abd pain.  Pt did not take her bp meds today.  It is unclear if she's actually taking them at all.  She has not been to her pcp since September.  She was supposed to f/u in 3 months. It looks like she missed her cardiology appt and has not called cards back to reschedule.  Pt denies f/c.  Due to language barrier, an interpreter was present during the history-taking and subsequent discussion (and for part of the physical exam) with this patient.        Home Medications Prior to Admission medications   Medication Sig Start Date End Date Taking? Authorizing Provider  ketorolac (TORADOL) 10 MG tablet Take 1 tablet (10 mg total) by mouth every 6 (six) hours as needed for moderate pain. 05/27/22  Yes Jacalyn Lefevre, MD  carvedilol (COREG) 6.25 MG tablet Take 1 tablet (6.25 mg total) by mouth 2 (two) times daily with a meal. 05/27/22   Jacalyn Lefevre, MD  dapagliflozin propanediol (FARXIGA) 10 MG TABS tablet Take 1 tablet (10 mg total) by mouth daily before breakfast. Patient not taking: Reported on 10/12/2021 08/03/21   Jodelle Gross, NP  losartan (COZAAR) 25 MG tablet Take 1 tablet (25 mg total) by mouth daily. 05/27/22 06/26/22  Jacalyn Lefevre, MD  metFORMIN (GLUCOPHAGE) 1000 MG tablet Take 1 tablet (1,000 mg total) by mouth 2 (two) times daily with a meal. 05/27/22   Jacalyn Lefevre, MD  potassium chloride (KLOR-CON M) 10 MEQ tablet Take 1 tablet (10 mEq total) by mouth at bedtime. Patient not taking: Reported on 10/12/2021 07/06/21   Alberteen Sam, MD  spironolactone  (ALDACTONE) 25 MG tablet Take 1 tablet (25 mg total) by mouth daily. Patient not taking: Reported on 10/12/2021 08/03/21   Cannon Kettle, PA-C      Allergies    Patient has no known allergies.    Review of Systems   Review of Systems  Cardiovascular:  Positive for chest pain.  All other systems reviewed and are negative.   Physical Exam Updated Vital Signs BP (!) 147/90   Pulse 85   Temp 97.9 F (36.6 C) (Oral)   Resp 18   Ht  (1.6 m)   Wt 105.7 kg   SpO2 96%   BMI 41.27 kg/m  Physical Exam Vitals and nursing note reviewed.  Constitutional:      Appearance: She is well-developed. She is obese.  HENT:     Head: Normocephalic and atraumatic.  Eyes:     Extraocular Movements: Extraocular movements intact.     Pupils: Pupils are equal, round, and reactive to light.  Cardiovascular:     Rate and Rhythm: Normal rate and regular rhythm.     Heart sounds: Normal heart sounds.  Pulmonary:     Effort: Pulmonary effort is normal.     Breath sounds: Normal breath sounds.  Abdominal:     General: Bowel sounds are normal.     Palpations:  Abdomen is soft.  Musculoskeletal:        General: Normal range of motion.       Arms:     Cervical back: Normal range of motion and neck supple.  Skin:    General: Skin is warm.     Capillary Refill: Capillary refill takes less than 2 seconds.     Findings: No rash.  Neurological:     General: No focal deficit present.     Mental Status: She is alert and oriented to person, place, and time.  Psychiatric:        Mood and Affect: Mood normal.        Behavior: Behavior normal.     ED Results / Procedures / Treatments   Labs (all labs ordered are listed, but only abnormal results are displayed) Labs Reviewed  CBC WITH DIFFERENTIAL/PLATELET - Abnormal; Notable for the following components:      Result Value   Hemoglobin 10.8 (*)    HCT 35.7 (*)    MCV 70.6 (*)    MCH 21.3 (*)    RDW 16.3 (*)    All other components within  normal limits  COMPREHENSIVE METABOLIC PANEL - Abnormal; Notable for the following components:   Sodium 133 (*)    Glucose, Bld 192 (*)    Calcium 8.5 (*)    Albumin 3.3 (*)    All other components within normal limits  URINALYSIS, ROUTINE W REFLEX MICROSCOPIC - Abnormal; Notable for the following components:   APPearance CLOUDY (*)    Glucose, UA >=500 (*)    Protein, ur 30 (*)    Bacteria, UA MANY (*)    All other components within normal limits  BRAIN NATRIURETIC PEPTIDE  D-DIMER, QUANTITATIVE  PREGNANCY, URINE  TROPONIN I (HIGH SENSITIVITY)  TROPONIN I (HIGH SENSITIVITY)    EKG EKG Interpretation  Date/Time:  Monday May 27 2022 06:33:23 EDT Ventricular Rate:  79 PR Interval:  182 QRS Duration: 86 QT Interval:  396 QTC Calculation: 454 R Axis:   39 Text Interpretation: Normal sinus rhythm Normal ECG When compared with ECG of 03-Jul-2021 06:07, PREVIOUS ECG IS PRESENT No significant change since last tracing Confirmed by Jacalyn Lefevre [phone]) on 05/27/2022 8:42:41 AM  Radiology DG Chest 2 View  Result Date: 05/27/2022 CLINICAL DATA:  42 year old female with shortness of breath. EXAM: CHEST - 2 VIEW COMPARISON:  Cardiac CT, CTA 07/06/2021. Chest radiographs 07/03/2021 and earlier. FINDINGS: Cardiomegaly. Heart size not significantly changed from last year. Other mediastinal contours are within normal limits. Visualized tracheal air column is within normal limits. Lung volumes are stable and within normal limits. Pulmonary vascularity appears stable, without overt pulmonary edema. No pneumothorax, pleural effusion or confluent lung opacity. No acute osseous abnormality identified. Negative visible bowel gas. IMPRESSION: Stable cardiomegaly. Pulmonary vascular congestion without overt edema. Electronically Signed   By: Odessa Fleming M.D.   On: 05/27/2022 06:58    Procedures Procedures    Medications Ordered in ED Medications  HYDROcodone-acetaminophen (NORCO/VICODIN) 5-325 MG  per tablet 1 tablet (1 tablet Oral Given 05/27/22 0642)  ketorolac (TORADOL) 30 MG/ML injection 30 mg (30 mg Intravenous Given 05/27/22 1002)  losartan (COZAAR) tablet 25 mg (25 mg Oral Given 05/27/22 0950)  carvedilol (COREG) tablet 6.25 mg (6.25 mg Oral Given 05/27/22 0950)  spironolactone (ALDACTONE) tablet 25 mg (25 mg Oral Given 05/27/22 1191)    ED Course/ Medical Decision Making/ A&P  Medical Decision Making Amount and/or Complexity of Data Reviewed Labs: ordered.  Risk Prescription drug management.   This patient presents to the ED for concern of cp, this involves an extensive number of treatment options, and is a complaint that carries with it a high risk of complications and morbidity.  The differential diagnosis includes cardiac, pulm, gi, msk   Co morbidities that complicate the patient evaluation  Htn and dm   Additional history obtained:  Additional history obtained from epic chart review  Lab Tests:  I Ordered, and personally interpreted labs.  The pertinent results include:  cbc with hgb 10.8 (hgb 12.3 in sept, but it was 10.9 a year ago); cmp with glucose elevated at 192, trop nl times 2, ua contaminated, ddimer neg, preg neg   Imaging Studies ordered:  I ordered imaging studies including cxr  I independently visualized and interpreted imaging which showed  Stable cardiomegaly. Pulmonary vascular congestion without overt  edema.   I agree with the radiologist interpretation   Cardiac Monitoring:  The patient was maintained on a cardiac monitor.  I personally viewed and interpreted the cardiac monitored which showed an underlying rhythm of: nsr   Medicines ordered and prescription drug management:  I ordered medication including coreg and cozaar  for bp and toradol for pain Reevaluation of the patient after these medicines showed that the patient improved I have reviewed the patients home medicines and have made adjustments  as needed   Test Considered:  Ct, but ddimer neg   Critical Interventions:  Bp meds   Problem List / ED Course:  HTN:  pt given her home bp meds with some food.  BP much improved.  She said she needs a refill for her meds.  She said she does not need help paying for them.   She was unaware that she needed to see her provider in December.  She is told to call her to make an appt.  Pt is instructed to return if worse.   Right sided chest pain:   atypical.  Ddimer neg.  Pain gone after toradol.  Likely msk.  Pt d/c with toradol as that helped here.   Reevaluation:  After the interventions noted above, I reevaluated the patient and found that they have :improved   Social Determinants of Health:  Self pay/Spanish speaker   Dispostion:  After consideration of the diagnostic results and the patients response to treatment, I feel that the patent would benefit from discharge with outpatient f/u.          Final Clinical Impression(s) / ED Diagnoses Final diagnoses:  Atypical chest pain    Rx / DC Orders ED Discharge Orders          Ordered    carvedilol (COREG) 6.25 MG tablet  2 times daily with meals        05/27/22 1323    losartan (COZAAR) 25 MG tablet  Daily        05/27/22 1323    metFORMIN (GLUCOPHAGE) 1000 MG tablet  2 times daily with meals       Note to Pharmacy: qty verified with md.[email or web address]   05/27/22 1323    ketorolac (TORADOL) 10 MG tablet  Every 6 hours PRN        05/27/22 1323              Jacalyn Lefevre, MD 05/27/22 1324

## 2022-05-28 ENCOUNTER — Telehealth: Payer: Self-pay

## 2022-05-28 NOTE — Transitions of Care (Post Inpatient/ED Visit) (Cosign Needed)
   05/28/2022  Name: Nicole Parker MRN: [id number] DOB: 1980/12/29  Today's TOC FU Call Status: Today's TOC FU Call Status:: Successful TOC FU Call Competed TOC FU Call Complete Date: 05/28/22  Transition Care Management Follow-up Telephone Call Date of Discharge: 05/27/22 [facility] Type of Discharge: Emergency Department Reason for ED Visit: Orthopedic Conditions How have you been since you were released from the hospital?: Better Any questions or concerns?: No  Items Reviewed: Did you receive and understand the discharge instructions provided?: Yes Medications obtained and verified?: Yes (Medications Reviewed) Any new allergies since your discharge?: No Dietary orders reviewed?: NA Do you have support at home?: Yes People in Home: other relative(s), spouse  Home Care and Equipment/Supplies: Were Home Health Services Ordered?: NA Any new equipment or medical supplies ordered?: NA  Functional Questionnaire:    Follow up appointments reviewed: PCP Follow-up appointment confirmed?: Yes Date of PCP follow-up appointment?: 06/12/22 Follow-up Provider: Angus Seller Specialist [facility] Follow-up appointment confirmed?: NA Do you understand care options if your condition(s) worsen?: Yes-patient verbalized understanding  SDOH Interventions Today    Flowsheet Row Most Recent Value  SDOH Interventions   Utilities Interventions Intervention Not Indicated       SIGNATURE Renelda Loma RMA

## 2022-06-12 ENCOUNTER — Encounter: Payer: Self-pay | Admitting: Nurse Practitioner

## 2022-06-12 ENCOUNTER — Ambulatory Visit (INDEPENDENT_AMBULATORY_CARE_PROVIDER_SITE_OTHER): Payer: Self-pay | Admitting: Nurse Practitioner

## 2022-06-12 VITALS — BP 135/83 | HR 72 | Temp 97.4°F | Ht 64.0 in | Wt 235.0 lb

## 2022-06-12 DIAGNOSIS — M546 Pain in thoracic spine: Secondary | ICD-10-CM

## 2022-06-12 DIAGNOSIS — E119 Type 2 diabetes mellitus without complications: Secondary | ICD-10-CM

## 2022-06-12 DIAGNOSIS — I10 Essential (primary) hypertension: Secondary | ICD-10-CM

## 2022-06-12 DIAGNOSIS — Z13220 Encounter for screening for lipoid disorders: Secondary | ICD-10-CM

## 2022-06-12 LAB — POCT GLYCOSYLATED HEMOGLOBIN (HGB A1C): Hemoglobin A1C: 7.5 % — AB (ref 4.0–5.6)

## 2022-06-12 MED ORDER — LOSARTAN POTASSIUM 25 MG PO TABS: 25.0000 mg | ORAL_TABLET | Freq: Every day | ORAL | 0 refills | Status: DC

## 2022-06-12 MED ORDER — CARVEDILOL 6.25 MG PO TABS: 6.2500 mg | ORAL_TABLET | Freq: Two times a day (BID) | ORAL | 0 refills | Status: DC

## 2022-06-12 MED ORDER — SPIRONOLACTONE 25 MG PO TABS: 25.0000 mg | ORAL_TABLET | Freq: Every day | ORAL | 6 refills | Status: DC

## 2022-06-12 MED ORDER — TIZANIDINE HCL 4 MG PO TABS
4.0000 mg | ORAL_TABLET | Freq: Four times a day (QID) | ORAL | 0 refills | Status: AC | PRN
Start: 2022-06-12 — End: ?

## 2022-06-12 MED ORDER — METFORMIN HCL 1000 MG PO TABS: 1000.0000 mg | ORAL_TABLET | Freq: Two times a day (BID) | ORAL | 0 refills | Status: DC

## 2022-06-12 MED ORDER — KETOROLAC TROMETHAMINE 10 MG PO TABS
10.0000 mg | ORAL_TABLET | Freq: Four times a day (QID) | ORAL | 0 refills | Status: AC | PRN
Start: 2022-06-12 — End: ?

## 2022-06-12 NOTE — Assessment & Plan Note (Signed)
-   POCT glycosylated hemoglobin (Hb A1C) - Microalbumin/Creatinine Ratio, Urine - AMB Referral to Pharmacy Medication Management - CBC - Comprehensive metabolic panel  2. Primary hypertension  - AMB Referral to Pharmacy Medication Management - CBC - Comprehensive metabolic panel  3. Lipid screening  - Lipid Panel   4. Acute right-sided thoracic back pain  - tiZANidine (ZANAFLEX) 4 MG tablet; Take 1 tablet (4 mg total) by mouth every 6 (six) hours as needed for muscle spasms.  Dispense: 30 tablet; Refill: 0 - ketorolac (TORADOL) 10 MG tablet; Take 1 tablet (10 mg total) by mouth every 6 (six) hours as needed for moderate pain.  Dispense: 10 tablet; Refill: 0   Follow up:  Follow up in 3 months

## 2022-06-12 NOTE — Progress Notes (Signed)
@Patient  ID: Nicole Parker, female    DOB: [birth year], 42 y.o.   MRN: [id number]  Chief Complaint  Patient presents with   Hospitalization Follow-up    Follow up    Referring provider: Ivonne Andrew, NP   HPI  42 year old female with history of hypertension, cushing, thoracic aortic aneurysm, fatty liver, diabetes.   Patient presents today for a ED follow-up.  She was seen in the ED on 05/27/2022 with back pain and shortness of breath. The pertinent results included:  cbc with hgb 10.8 (hgb 12.3 in sept, but it was 10.9 a year ago); cmp with glucose elevated at 192, trop nl times 2, ua contaminated, ddimer neg, preg neg.  Patient has been lost to follow up. Did not follow up as advised at last visit. Did not go to cardiology follow up appointment.  Patient does need refills on medications today.  Her A1c in office today is 7.5.  She states that overall she has been doing well since hospital visit.  The Toradol did help and she is feeling much better.  Her back is still tender to palpation.  Much improved.  Will refill Toradol and order muscle relaxer. Denies f/c/s, n/v/d, hemoptysis, PND, leg swelling Denies chest pain or edema      No Known Allergies  Immunization History  Administered Date(s) Administered   Tdap 11/03/2013    Past Medical History:  Diagnosis Date   Gestational diabetes    Hypertension    Pregnancy induced hypertension     Tobacco History: Social History   Tobacco Use  Smoking Status Never  Smokeless Tobacco Never   Counseling given: Not Answered   Outpatient Encounter Medications as of 06/12/2022  Medication Sig   dapagliflozin propanediol (FARXIGA) 10 MG TABS tablet Take 1 tablet (10 mg total) by mouth daily before breakfast.   tiZANidine (ZANAFLEX) 4 MG tablet Take 1 tablet (4 mg total) by mouth every 6 (six) hours as needed for muscle spasms.   [DISCONTINUED] carvedilol (COREG) 6.25 MG tablet Take 1 tablet (6.25 mg total) by mouth 2  (two) times daily with a meal.   [DISCONTINUED] ketorolac (TORADOL) 10 MG tablet Take 1 tablet (10 mg total) by mouth every 6 (six) hours as needed for moderate pain.   [DISCONTINUED] losartan (COZAAR) 25 MG tablet Take 1 tablet (25 mg total) by mouth daily.   [DISCONTINUED] metFORMIN (GLUCOPHAGE) 1000 MG tablet Take 1 tablet (1,000 mg total) by mouth 2 (two) times daily with a meal.   [DISCONTINUED] spironolactone (ALDACTONE) 25 MG tablet Take 1 tablet (25 mg total) by mouth daily.   carvedilol (COREG) 6.25 MG tablet Take 1 tablet (6.25 mg total) by mouth 2 (two) times daily with a meal.   ketorolac (TORADOL) 10 MG tablet Take 1 tablet (10 mg total) by mouth every 6 (six) hours as needed for moderate pain.   losartan (COZAAR) 25 MG tablet Take 1 tablet (25 mg total) by mouth daily.   metFORMIN (GLUCOPHAGE) 1000 MG tablet Take 1 tablet (1,000 mg total) by mouth 2 (two) times daily with a meal.   potassium chloride (KLOR-CON M) 10 MEQ tablet Take 1 tablet (10 mEq total) by mouth at bedtime. (Patient not taking: Reported on 10/12/2021)   spironolactone (ALDACTONE) 25 MG tablet Take 1 tablet (25 mg total) by mouth daily.   No facility-administered encounter medications on file as of 06/12/2022.     Review of Systems  Review of Systems  Constitutional: Negative.  HENT: Negative.    Cardiovascular: Negative.   Gastrointestinal: Negative.   Allergic/Immunologic: Negative.   Neurological: Negative.   Psychiatric/Behavioral: Negative.         Physical Exam  BP 135/83   Pulse 72   Temp (!) 97.4 F (36.3 C)   Ht 5\' 4"  (1.626 m)   Wt 235 lb (106.6 kg)   SpO2 98%   BMI 40.34 kg/m   Wt Readings from Last 5 Encounters:  06/12/22 235 lb (106.6 kg)  05/27/22 233 lb (105.7 kg)  10/12/21 236 lb 4 oz (107.2 kg)  08/03/21 236 lb 6.4 oz (107.2 kg)  07/18/21 236 lb 9.6 oz (107.3 kg)     Physical Exam Vitals and nursing note reviewed.  Constitutional:      General: She is not in acute  distress.    Appearance: She is well-developed.  Cardiovascular:     Rate and Rhythm: Normal rate and regular rhythm.  Pulmonary:     Effort: Pulmonary effort is normal.     Breath sounds: Normal breath sounds.  Musculoskeletal:     Thoracic back: Spasms and tenderness present.       Back:     Comments: No rash noted  Neurological:     Mental Status: She is alert and oriented to person, place, and time.      Lab Results:  CBC    Component Value Date/Time   WBC 6.6 05/27/2022 0648   RBC 5.06 05/27/2022 0648   HGB 10.8 (L) 05/27/2022 0648   HGB 12.3 10/15/2021 0756   HCT 35.7 (L) 05/27/2022 0648   HCT 37.0 10/15/2021 0756   PLT 323 05/27/2022 0648   PLT 323 10/15/2021 0756   MCV 70.6 (L) 05/27/2022 0648   MCV 83 10/15/2021 0756   MCH 21.3 (L) 05/27/2022 0648   MCHC 30.3 05/27/2022 0648   RDW 16.3 (H) 05/27/2022 0648   RDW 13.0 10/15/2021 0756   LYMPHSABS 1.9 05/27/2022 0648   MONOABS 0.4 05/27/2022 0648   EOSABS 0.3 05/27/2022 0648   BASOSABS 0.0 05/27/2022 0648    BMET    Component Value Date/Time   NA 133 (L) 05/27/2022 0648   NA 130 (L) 10/15/2021 0756   K 3.5 05/27/2022 0648   CL 98 05/27/2022 0648   CO2 23 05/27/2022 0648   GLUCOSE 192 (H) 05/27/2022 0648   BUN 18 05/27/2022 0648   BUN 19 10/15/2021 0756   CREATININE 0.57 05/27/2022 0648   CALCIUM 8.5 (L) 05/27/2022 0648   GFRNONAA >60 05/27/2022 0648   GFRAA 121 04/05/2019 1112    BNP    Component Value Date/Time   BNP 44.6 05/27/2022 0648    ProBNP No results found for: "PROBNP"  Imaging: DG Chest 2 View  Result Date: 05/27/2022 CLINICAL DATA:  42 year old female with shortness of breath. EXAM: CHEST - 2 VIEW COMPARISON:  Cardiac CT, CTA 07/06/2021. Chest radiographs 07/03/2021 and earlier. FINDINGS: Cardiomegaly. Heart size not significantly changed from last year. Other mediastinal contours are within normal limits. Visualized tracheal air column is within normal limits. Lung volumes are  stable and within normal limits. Pulmonary vascularity appears stable, without overt pulmonary edema. No pneumothorax, pleural effusion or confluent lung opacity. No acute osseous abnormality identified. Negative visible bowel gas. IMPRESSION: Stable cardiomegaly. Pulmonary vascular congestion without overt edema. Electronically Signed   By: Odessa Fleming M.D.   On: 05/27/2022 06:58     Assessment & Plan:   Type 2 diabetes mellitus without complication, without long-term  current use of insulin (HCC) - POCT glycosylated hemoglobin (Hb A1C) - Microalbumin/Creatinine Ratio, Urine - AMB Referral to Pharmacy Medication Management - CBC - Comprehensive metabolic panel  2. Primary hypertension  - AMB Referral to Pharmacy Medication Management - CBC - Comprehensive metabolic panel  3. Lipid screening  - Lipid Panel   4. Acute right-sided thoracic back pain  - tiZANidine (ZANAFLEX) 4 MG tablet; Take 1 tablet (4 mg total) by mouth every 6 (six) hours as needed for muscle spasms.  Dispense: 30 tablet; Refill: 0 - ketorolac (TORADOL) 10 MG tablet; Take 1 tablet (10 mg total) by mouth every 6 (six) hours as needed for moderate pain.  Dispense: 10 tablet; Refill: 0   Follow up:  Follow up in 3 months     Ivonne Andrew, NP 06/12/2022

## 2022-06-12 NOTE — Patient Instructions (Addendum)
1. Type 2 diabetes mellitus without complication, without long-term current use of insulin (HCC)  - POCT glycosylated hemoglobin (Hb A1C) - Microalbumin/Creatinine Ratio, Urine - AMB Referral to Pharmacy Medication Management - CBC - Comprehensive metabolic panel  2. Primary hypertension  - AMB Referral to Pharmacy Medication Management - CBC - Comprehensive metabolic panel  3. Lipid screening  - Lipid Panel   4. Acute right-sided thoracic back pain  - tiZANidine (ZANAFLEX) 4 MG tablet; Take 1 tablet (4 mg total) by mouth every 6 (six) hours as needed for muscle spasms.  Dispense: 30 tablet; Refill: 0 - ketorolac (TORADOL) 10 MG tablet; Take 1 tablet (10 mg total) by mouth every 6 (six) hours as needed for moderate pain.  Dispense: 10 tablet; Refill: 0   Follow up:  Follow up in 3 months

## 2022-06-13 LAB — COMPREHENSIVE METABOLIC PANEL
ALT: 20 IU/L (ref 0–32)
AST: 14 IU/L (ref 0–40)
Albumin/Globulin Ratio: 1.4 (ref 1.2–2.2)
Albumin: 4.2 g/dL (ref 3.9–4.9)
Alkaline Phosphatase: 85 IU/L (ref 44–121)
BUN/Creatinine Ratio: 26 — ABNORMAL HIGH (ref 9–23)
BUN: 17 mg/dL (ref 6–24)
Bilirubin Total: 0.3 mg/dL (ref 0.0–1.2)
CO2: 22 mmol/L (ref 20–29)
Calcium: 9.3 mg/dL (ref 8.7–10.2)
Chloride: 101 mmol/L (ref 96–106)
Creatinine, Ser: 0.65 mg/dL (ref 0.57–1.00)
Globulin, Total: 3 g/dL (ref 1.5–4.5)
Glucose: 132 mg/dL — ABNORMAL HIGH (ref 70–99)
Potassium: 4.2 mmol/L (ref 3.5–5.2)
Sodium: 137 mmol/L (ref 134–144)
Total Protein: 7.2 g/dL (ref 6.0–8.5)
eGFR: 113 mL/min/{1.73_m2} (ref >59)

## 2022-06-13 LAB — LIPID PANEL
Chol/HDL Ratio: 3.9 ratio (ref 0.0–4.4)
Cholesterol, Total: 173 mg/dL (ref 100–199)
HDL: 44 mg/dL (ref >39)
LDL Chol Calc (NIH): 104 mg/dL — ABNORMAL HIGH (ref 0–99)
Triglycerides: 143 mg/dL (ref 0–149)
VLDL Cholesterol Cal: 25 mg/dL (ref 5–40)

## 2022-06-13 LAB — CBC
Hematocrit: 37.1 % (ref 34.0–46.6)
Hemoglobin: 10.8 g/dL — ABNORMAL LOW (ref 11.1–15.9)
MCH: 20.7 pg — ABNORMAL LOW (ref 26.6–33.0)
MCHC: 29.1 g/dL — ABNORMAL LOW (ref 31.5–35.7)
MCV: 71 fL — ABNORMAL LOW (ref 79–97)
Platelets: 382 10*3/uL (ref 150–450)
RBC: 5.21 x10E6/uL (ref 3.77–5.28)
RDW: 16.3 % — ABNORMAL HIGH (ref 11.7–15.4)
WBC: 6.4 10*3/uL (ref 3.4–10.8)

## 2022-06-13 LAB — MICROALBUMIN / CREATININE URINE RATIO
Creatinine, Urine: 104.8 mg/dL
Microalb/Creat Ratio: 40 mg/g creat — ABNORMAL HIGH (ref 0–29)
Microalbumin, Urine: 41.4 ug/mL

## 2022-06-14 ENCOUNTER — Telehealth: Payer: Self-pay

## 2022-06-14 NOTE — Progress Notes (Signed)
   Care Guide Note  06/14/2022 Name: Nicole Parker MRN: [id number] DOB: 05/06/1980  Referred by: Ivonne Andrew, NP Reason for referral : Care Coordination (Outreach to schedule with Pharm d )   Nicole Parker is a 42 y.o. year old female who is a primary care patient of Ivonne Andrew, NP. Nicole Parker was referred to the pharmacist for assistance related to DM.    Successful contact was made with the patient to discuss pharmacy services including being ready for the pharmacist to call at least 5 minutes before the scheduled appointment time, to have medication bottles and any blood sugar or blood pressure readings ready for review. The patient agreed to meet with the pharmacist via with the pharmacist via in office 07/04/2022 on (date/time).    [name], RMA Care Guide [facility]  [place], [place] [address] Direct Dial: [phone] [email or web address]

## 2022-06-28 ENCOUNTER — Ambulatory Visit: Payer: Self-pay | Admitting: Cardiovascular Disease

## 2022-07-02 NOTE — Progress Notes (Signed)
Called pt and inform results.Gh 

## 2022-07-04 ENCOUNTER — Other Ambulatory Visit: Payer: Self-pay | Admitting: Pharmacist

## 2022-07-04 ENCOUNTER — Ambulatory Visit: Payer: Self-pay | Admitting: Pharmacist

## 2022-07-04 ENCOUNTER — Telehealth: Payer: Self-pay | Admitting: Pharmacist

## 2022-07-04 NOTE — Progress Notes (Signed)
Patient did not show for scheduled phone appointment. Attempted to contact patient for scheduled appointment for medication management. Left HIPAA compliant message for patient to return my call at their convenience.   Catie Eppie Gibson, PharmD, BCACP, CPP [facility] [phone]

## 2022-07-29 ENCOUNTER — Other Ambulatory Visit: Payer: Self-pay | Admitting: Nurse Practitioner

## 2022-07-29 ENCOUNTER — Ambulatory Visit
Admission: RE | Admit: 2022-07-29 | Discharge: 2022-07-29 | Disposition: A | Discharge status: Home / Self Care | Payer: Self-pay | Source: Ambulatory Visit | Attending: Nurse Practitioner | Admitting: Nurse Practitioner

## 2022-07-29 DIAGNOSIS — R058 Other specified cough: Secondary | ICD-10-CM

## 2022-09-12 ENCOUNTER — Ambulatory Visit: Payer: Self-pay | Admitting: Nurse Practitioner

## 2022-09-18 ENCOUNTER — Ambulatory Visit (INDEPENDENT_AMBULATORY_CARE_PROVIDER_SITE_OTHER): Payer: Self-pay | Admitting: Nurse Practitioner

## 2022-09-18 VITALS — BP 168/91 | HR 88 | Temp 97.3°F | Wt 243.4 lb

## 2022-09-18 DIAGNOSIS — E119 Type 2 diabetes mellitus without complications: Secondary | ICD-10-CM

## 2022-09-18 LAB — POCT GLYCOSYLATED HEMOGLOBIN (HGB A1C): Hemoglobin A1C: 8 % — AB (ref 4.0–5.6)

## 2022-09-18 NOTE — Patient Instructions (Addendum)
1. Type 2 diabetes mellitus without complication, without long-term current use of insulin (HCC)  - POCT glycosylated hemoglobin (Hb A1C) - Ambulatory referral to Ophthalmology - CBC - Comprehensive metabolic panel   Follow up:  Follow up in 3 months

## 2022-09-18 NOTE — Progress Notes (Unsigned)
@Patient  ID: Nicole Parker, female    DOB: September 29, 1980, 42 y.o.   MRN: [id number]  Chief Complaint  Patient presents with   Diabetes    Follow up    Referring provider: Ivonne Andrew, NP   HPI  42 year old female with history of hypertension, cushing, thoracic aortic aneurysm, fatty liver, diabetes.   Patient presents today for diabetes and hypertension follow-up.  Patient is noncompliant with medications. A1C in office today is 8.0.  Patient's blood pressure was noted to be elevated in office today.  We discussed the importance of taking her blood pressure medicines as directed.  She states that she did forget to take the medications today.  She will take them when she gets home.  Patient does have a home blood pressure cuff and will monitor blood pressure over the next few days.  Denies f/c/s, n/v/d, hemoptysis, PND, leg swelling Denies chest pain or edema   Note: Did not take blood pressure medication today.     No Known Allergies  Immunization History  Administered Date(s) Administered   Tdap 11/03/2013    Past Medical History:  Diagnosis Date   Gestational diabetes    Hypertension    Pregnancy induced hypertension     Tobacco History: Social History   Tobacco Use  Smoking Status Never  Smokeless Tobacco Never   Counseling given: Not Answered   Outpatient Encounter Medications as of 09/18/2022  Medication Sig   carvedilol (COREG) 6.25 MG tablet Take 1 tablet (6.25 mg total) by mouth 2 (two) times daily with a meal.   dapagliflozin propanediol (FARXIGA) 10 MG TABS tablet Take 1 tablet (10 mg total) by mouth daily before breakfast.   losartan (COZAAR) 25 MG tablet Take 1 tablet (25 mg total) by mouth daily.   metFORMIN (GLUCOPHAGE) 1000 MG tablet Take 1 tablet (1,000 mg total) by mouth 2 (two) times daily with a meal.   spironolactone (ALDACTONE) 25 MG tablet Take 1 tablet (25 mg total) by mouth daily.   ketorolac (TORADOL) 10 MG tablet Take 1  tablet (10 mg total) by mouth every 6 (six) hours as needed for moderate pain. (Patient not taking: Reported on 09/18/2022)   potassium chloride (KLOR-CON M) 10 MEQ tablet Take 1 tablet (10 mEq total) by mouth at bedtime. (Patient not taking: Reported on 10/12/2021)   tiZANidine (ZANAFLEX) 4 MG tablet Take 1 tablet (4 mg total) by mouth every 6 (six) hours as needed for muscle spasms. (Patient not taking: Reported on 09/18/2022)   No facility-administered encounter medications on file as of 09/18/2022.     Review of Systems  Review of Systems  Constitutional: Negative.   HENT: Negative.    Cardiovascular: Negative.   Gastrointestinal: Negative.   Allergic/Immunologic: Negative.   Neurological: Negative.   Psychiatric/Behavioral: Negative.         Physical Exam  BP (!) 168/91   Pulse 88   Temp (!) 97.3 F (36.3 C)   Wt 243 lb 6.4 oz (110.4 kg)   SpO2 99%   BMI 41.78 kg/m   Wt Readings from Last 5 Encounters:  09/18/22 243 lb 6.4 oz (110.4 kg)  06/12/22 235 lb (106.6 kg)  05/27/22 233 lb (105.7 kg)  10/12/21 236 lb 4 oz (107.2 kg)  08/03/21 236 lb 6.4 oz (107.2 kg)     Physical Exam Vitals and nursing note reviewed.  Constitutional:      General: She is not in acute distress.    Appearance: She is  well-developed.  Cardiovascular:     Rate and Rhythm: Normal rate and regular rhythm.  Pulmonary:     Effort: Pulmonary effort is normal.     Breath sounds: Normal breath sounds.  Neurological:     Mental Status: She is alert and oriented to person, place, and time.      Lab Results:  CBC    Component Value Date/Time   WBC 8.0 09/18/2022 1615   WBC 6.6 05/27/2022 0648   RBC 5.13 09/18/2022 1615   RBC 5.06 05/27/2022 0648   HGB 10.1 (L) 09/18/2022 1615   HCT 34.7 09/18/2022 1615   PLT 346 09/18/2022 1615   MCV 68 (L) 09/18/2022 1615   MCH 19.7 (L) 09/18/2022 1615   MCH 21.3 (L) 05/27/2022 0648   MCHC 29.1 (L) 09/18/2022 1615   MCHC 30.3 05/27/2022 0648    RDW 17.4 (H) 09/18/2022 1615   LYMPHSABS 1.9 05/27/2022 0648   MONOABS 0.4 05/27/2022 0648   EOSABS 0.3 05/27/2022 0648   BASOSABS 0.0 05/27/2022 0648    BMET    Component Value Date/Time   NA 138 09/18/2022 1615   K 3.5 09/18/2022 1615   CL 98 09/18/2022 1615   CO2 23 09/18/2022 1615   GLUCOSE 234 (H) 09/18/2022 1615   GLUCOSE 192 (H) 05/27/2022 0648   BUN 22 09/18/2022 1615   CREATININE 0.65 09/18/2022 1615   CALCIUM 8.7 09/18/2022 1615   GFRNONAA >60 05/27/2022 0648   GFRAA 121 04/05/2019 1112    BNP    Component Value Date/Time   BNP 44.6 05/27/2022 0648     Assessment & Plan:   Type 2 diabetes mellitus without complication, without long-term current use of insulin (HCC) - POCT glycosylated hemoglobin (Hb A1C) - Ambulatory referral to Ophthalmology - CBC - Comprehensive metabolic panel   Follow up:  Follow up in 3 months     Ivonne Andrew, NP 09/19/2022

## 2022-09-19 ENCOUNTER — Encounter: Payer: Self-pay | Admitting: Nurse Practitioner

## 2022-09-19 LAB — CBC
Hematocrit: 34.7 % (ref 34.0–46.6)
Hemoglobin: 10.1 g/dL — ABNORMAL LOW (ref 11.1–15.9)
MCH: 19.7 pg — ABNORMAL LOW (ref 26.6–33.0)
MCHC: 29.1 g/dL — ABNORMAL LOW (ref 31.5–35.7)
MCV: 68 fL — ABNORMAL LOW (ref 79–97)
Platelets: 346 10*3/uL (ref 150–450)
RBC: 5.13 x10E6/uL (ref 3.77–5.28)
RDW: 17.4 % — ABNORMAL HIGH (ref 11.7–15.4)
WBC: 8 10*3/uL (ref 3.4–10.8)

## 2022-09-19 LAB — COMPREHENSIVE METABOLIC PANEL
ALT: 18 IU/L (ref 0–32)
AST: 15 IU/L (ref 0–40)
Albumin: 3.7 g/dL — ABNORMAL LOW (ref 3.9–4.9)
Alkaline Phosphatase: 92 IU/L (ref 44–121)
BUN/Creatinine Ratio: 34 — ABNORMAL HIGH (ref 9–23)
BUN: 22 mg/dL (ref 6–24)
Bilirubin Total: <0.2 mg/dL (ref 0.0–1.2)
CO2: 23 mmol/L (ref 20–29)
Calcium: 8.7 mg/dL (ref 8.7–10.2)
Chloride: 98 mmol/L (ref 96–106)
Creatinine, Ser: 0.65 mg/dL (ref 0.57–1.00)
Globulin, Total: 3.3 g/dL (ref 1.5–4.5)
Glucose: 234 mg/dL — ABNORMAL HIGH (ref 70–99)
Potassium: 3.5 mmol/L (ref 3.5–5.2)
Sodium: 138 mmol/L (ref 134–144)
Total Protein: 7 g/dL (ref 6.0–8.5)
eGFR: 113 mL/min/{1.73_m2} (ref >59)

## 2022-09-19 NOTE — Assessment & Plan Note (Signed)
-   POCT glycosylated hemoglobin (Hb A1C) - Ambulatory referral to Ophthalmology - CBC - Comprehensive metabolic panel   Follow up:  Follow up in 3 months

## 2022-09-24 ENCOUNTER — Telehealth: Payer: Self-pay

## 2022-09-24 NOTE — Progress Notes (Signed)
   Care Guide Note  09/24/2022 Name: Nicole Parker MRN: [id number] DOB: December 10, 1980  Referred by: Ivonne Andrew, NP Reason for referral : Care Management (Outreach to schedule with Pharm d )   Nicole Parker is a 42 y.o. year old female who is a primary care patient of Ivonne Andrew, NP. Nicole Parker was referred to the pharmacist for assistance related to DM.    An unsuccessful telephone outreach was attempted today to contact the patient who was referred to the pharmacy team for assistance with medication management. Additional attempts will be made to contact the patient.   Nicole Parker, RMA Care Guide [facility]  [place], [place] [address] Direct Dial: [phone] [email or web address]

## 2022-09-26 NOTE — Progress Notes (Signed)
   Care Guide Note  09/26/2022 Name: Nicole Parker MRN: [id number] DOB: 11/07/1980  Referred by: Ivonne Andrew, NP Reason for referral : Care Management (Outreach to schedule with Pharm d )   Nicole Parker is a 42 y.o. year old female who is a primary care patient of Ivonne Andrew, NP. [name] was referred to the pharmacist for assistance related to DM.    A second unsuccessful telephone outreach was attempted today to contact the patient who was referred to the pharmacy team for assistance with medication management. Additional attempts will be made to contact the patient.  [name], RMA Care Guide [facility]  [place], [place] [address] Direct Dial: [phone] [email or web address]

## 2022-09-30 NOTE — Progress Notes (Signed)
   Care Guide Note  09/30/2022 Name: Nicole Parker MRN: [id number] DOB: 1980-05-27  Referred by: Ivonne Andrew, NP Reason for referral : Care Management (Outreach to schedule with Pharm d )   Nicole Parker is a 42 y.o. year old female who is a primary care patient of Ivonne Andrew, NP. [name] was referred to the pharmacist for assistance related to DM.    A third unsuccessful telephone outreach was attempted today to contact the patient who was referred to the pharmacy team for assistance with medication management. The Population Health team is pleased to engage with this patient at any time in the future upon receipt of referral and should he/she be interested in assistance from the [facility] team.   [name], RMA Care Guide [facility]  [place], [place] [address] Direct Dial: [phone] [email or web address]

## 2022-11-05 ENCOUNTER — Encounter: Payer: Self-pay | Admitting: Cardiovascular Disease

## 2022-11-05 ENCOUNTER — Ambulatory Visit: Payer: Self-pay | Attending: Cardiovascular Disease | Admitting: Cardiovascular Disease

## 2022-11-05 VITALS — BP 160/90 | HR 87 | Ht 62.0 in | Wt 245.6 lb

## 2022-11-05 DIAGNOSIS — I7121 Aneurysm of the ascending aorta, without rupture: Secondary | ICD-10-CM

## 2022-11-05 DIAGNOSIS — I5042 Chronic combined systolic (congestive) and diastolic (congestive) heart failure: Secondary | ICD-10-CM

## 2022-11-05 DIAGNOSIS — I10 Essential (primary) hypertension: Secondary | ICD-10-CM

## 2022-11-05 DIAGNOSIS — E7800 Pure hypercholesterolemia, unspecified: Secondary | ICD-10-CM

## 2022-11-05 DIAGNOSIS — E119 Type 2 diabetes mellitus without complications: Secondary | ICD-10-CM

## 2022-11-05 MED ORDER — LOSARTAN POTASSIUM 50 MG PO TABS: 50.0000 mg | ORAL_TABLET | Freq: Every day | ORAL | 3 refills | Status: DC

## 2022-11-05 NOTE — Progress Notes (Signed)
Cardiology Office Note:  .   Date:  11/05/2022  ID:  Nicole Parker, DOB 08-24-1980, MRN [id number] PCP: Ivonne Andrew, NP  [facility] Providers Cardiologist:  Thurmon Fair, MD    History of Present Illness: .   Nicole Parker is a 42 y.o. 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  She returns in follow-up  Echocardiogram during hospitalization in May-June 2023 showed LVEF 40-45% and grade 2 diastolic dysfunction.  Coronary CT angiography showed no evidence of coronary stenosis of the calcium score was 0.  It was felt that her cardiomyopathy is most likely due to insufficiently treated hypertension.  Renal artery ultrasound showed no evidence of renal artery stenosis.  She has been taking carvedilol, losartan, Farxiga and spironolactone.  We were unable to financially find a way for her to take Entresto.  She has not had any side effects with these medications (specifically denies vaginal yeast infection or urinary tract infections).  Your blood pressure today is a little high and when it was checked on 09/18/2022 was even higher at 180/92.  ROS: The patient specifically denies any chest pain at rest exertion, dyspnea at rest or with exertion, orthopnea, paroxysmal nocturnal dyspnea, syncope, palpitations, focal neurological deficits, intermittent claudication, lower extremity edema, unexplained weight gain, cough, hemoptysis or wheezing.  She is originally from [place] [facility]).  Studies Reviewed: .         Risk Assessment/Calculations:     HYPERTENSION CONTROL Vitals:   11/05/22 1546 11/05/22 1616  BP: (!) 154/82 (!) 160/90    The patient's blood pressure is elevated above target today.  In order to address the patient's elevated BP: A current anti-hypertensive medication was adjusted today.           Physical Exam:   VS:  BP (!) 160/90   Pulse 87   Ht 5\' 2"  (1.575 m)   Wt 245 lb 9.6 oz (111.4 kg)   SpO2 97%   BMI 44.92 kg/m    Wt Readings from Last 3 Encounters:  11/05/22 245 lb 9.6 oz (111.4 kg)  09/18/22 243 lb 6.4 oz (110.4 kg)  06/12/22 235 lb (106.6 kg)    GEN: Well nourished, well developed in no acute distress NECK: No JVD; No carotid bruits CARDIAC: RRR, no murmurs, rubs, gallops RESPIRATORY:  Clear to auscultation without rales, wheezing or rhonchi  ABDOMEN: Soft, non-tender, non-distended EXTREMITIES:  No edema; No deformity   ASSESSMENT AND PLAN: .   CHF: Euvolemic, NYHA functional class I.  She is not taking loop diuretics.  She is on comprehensive guideline directed medical therapy except we are using ARB rather than Entresto.  Increase losartan 50 mg daily.  She will send [place] blood pressure readings in the next couple of weeks and we can decide whether we should increase losartan further to maximum dose of 100 mg daily.  I wonder whether we will find that her LV function has already improved.  Plan to repeat echocardiogram.  This will also allow [place] to reevaluate the ascending aortic diameter. HTN: Most likely reason for her cardiomyopathy.  She does not have renal artery stenosis.  We have not performed investigations for other secondary causes such as primary hyperaldosteronism or pheochromocytoma, but she really does not have features to suggest either 1 of these disorders. DM: Insufficient control.  Most recent hemoglobin A1c was 8.0% (at her age target less  than 7%).  [name] is beneficial for heart failure and diabetes.  She is morbidly obese and will also benefit from GLP-1 agonist, but this may be financially impossible. HLP: On the most recent lipid profile her LDL cholesterol is 104, higher than desirable, but her calcium score was 0.  Ideally will be able to bring down her cholesterol to target less than 100 with lifestyle changes/weight loss. Morbid obesity: She  does not have daytime hypersomnolence or other symptoms of obstructive sleep apnea.  Weight loss will be highly beneficial for multiple metabolic abnormalities. 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  Will reevaluate with an echo this year, consider repeating the CT angiogram next year.       Dispo: Increase losartan 50 mg daily.  Send [place] blood pressure readings in a few weeks.  Repeat echocardiogram.  Signed, Thurmon Fair, MD

## 2022-11-05 NOTE — Patient Instructions (Signed)
Medication Instructions:  INCREASE LOSARTAN TO 50 MG *If you need a refill on your cardiac medications before your next appointment, please call your pharmacy*  Testing/Procedures: Your physician has requested that you have an echocardiogram. Echocardiography is a painless test that uses sound waves to create images of your heart. It provides your doctor with information about the size and shape of your heart and how well your heart's chambers and valves are working. This procedure takes approximately one hour. There are no restrictions for this procedure. Please do NOT wear cologne, perfume, aftershave, or lotions (deodorant is allowed). Please arrive 15 minutes prior to your appointment time.   KEEP A LOG OF YOUR BP-CHECK BP ONCE A DAY FOR 2 WEEKS AND SEND IN- MAIL IN OR DROP OFF  Follow-Up: At [facility], you and your health needs are our priority.  As part of our continuing mission to provide you with exceptional heart care, we have created designated Provider Care Teams.  These Care Teams include your primary Cardiologist (physician) and Advanced Practice Providers (APPs -  Physician Assistants and Nurse Practitioners) who all work together to provide you with the care you need, when you need it.  We recommend signing up for the patient portal called "MyChart".  Sign up information is provided on this After Visit Summary.  MyChart is used to connect with patients for Virtual Visits (Telemedicine).  Patients are able to view lab/test results, encounter notes, upcoming appointments, etc.  Non-urgent messages can be sent to your provider as well.   To learn more about what you can do with MyChart, go to [email or web address].    Your next appointment:   1 year(s)  Provider:   Thurmon Fair, MD

## 2022-11-06 ENCOUNTER — Telehealth (HOSPITAL_BASED_OUTPATIENT_CLINIC_OR_DEPARTMENT_OTHER): Payer: Self-pay | Admitting: Licensed Clinical Social Worker

## 2022-11-06 ENCOUNTER — Ambulatory Visit (HOSPITAL_COMMUNITY)
Admission: RE | Admit: 2022-11-06 | Discharge: 2022-11-06 | Disposition: A | Discharge status: Home / Self Care | Payer: Self-pay | Source: Ambulatory Visit | Attending: Nurse Practitioner | Admitting: Nurse Practitioner

## 2022-11-06 ENCOUNTER — Encounter: Payer: Self-pay | Admitting: Nurse Practitioner

## 2022-11-06 ENCOUNTER — Ambulatory Visit (INDEPENDENT_AMBULATORY_CARE_PROVIDER_SITE_OTHER): Payer: Self-pay | Admitting: Nurse Practitioner

## 2022-11-06 VITALS — BP 126/71 | HR 81 | Temp 97.0°F | Wt 244.0 lb

## 2022-11-06 DIAGNOSIS — M546 Pain in thoracic spine: Secondary | ICD-10-CM | POA: Insufficient documentation

## 2022-11-06 DIAGNOSIS — Z23 Encounter for immunization: Secondary | ICD-10-CM

## 2022-11-06 DIAGNOSIS — Z8709 Personal history of other diseases of the respiratory system: Secondary | ICD-10-CM | POA: Insufficient documentation

## 2022-11-06 MED ORDER — PREDNISONE 20 MG PO TABS
20.0000 mg | ORAL_TABLET | Freq: Every day | ORAL | 0 refills | Status: AC
Start: 2022-11-06 — End: 2022-11-11

## 2022-11-06 MED ORDER — KETOROLAC TROMETHAMINE 30 MG/ML IJ SOLN
30.0000 mg | Freq: Once | INTRAMUSCULAR | Status: AC
Start: 2022-11-06 — End: 2022-11-06
  Administered 2022-11-06: 30 mg via INTRAMUSCULAR

## 2022-11-06 NOTE — Progress Notes (Signed)
Subjective   Patient ID: Nicole Parker, female    DOB: 1980/08/18, 42 y.o.   MRN: [id number]  Chief Complaint  Patient presents with   Hypertension   Diabetes   Medical Management of Chronic Issues    Referring provider: Ivonne Andrew, NP  Nicole Parker is a 42 y.o. female with Past Medical History: No date: Gestational diabetes No date: Hypertension No date: Pregnancy induced hypertension   Back Pain This is a new problem. The current episode started 1 to 4 weeks ago. The problem occurs constantly. The problem has been gradually worsening since onset. The pain is present in the thoracic spine. The quality of the pain is described as stabbing. The pain does not radiate. The pain is at a severity of 6/10. The pain is moderate. The pain is The same all the time. The symptoms are aggravated by position. Risk factors include obesity. She has tried NSAIDs for the symptoms. The treatment provided no relief.   Note: Patient did recently get amoxicillin through urgent care for URI and was diagnosed with bronchitis.  She states that she is having pain to her back at that time and is still having pain to her back currently.    No Known Allergies  Immunization History  Administered Date(s) Administered   Tdap 11/03/2013    Tobacco History: Social History   Tobacco Use  Smoking Status Never  Smokeless Tobacco Never   Counseling given: Not Answered   Outpatient Encounter Medications as of 11/06/2022  Medication Sig   carvedilol (COREG) 6.25 MG tablet Take 1 tablet (6.25 mg total) by mouth 2 (two) times daily with a meal.   dapagliflozin propanediol (FARXIGA) 10 MG TABS tablet Take 1 tablet (10 mg total) by mouth daily before breakfast.   losartan (COZAAR) 50 MG tablet Take 1 tablet (50 mg total) by mouth daily.   metFORMIN (GLUCOPHAGE) 1000 MG tablet Take 1 tablet (1,000 mg total) by mouth 2 (two) times daily with a meal. (Patient taking differently: Take 500 mg  by mouth 2 (two) times daily with a meal. Take 2 tablets twice daily)   predniSONE (DELTASONE) 20 MG tablet Take 1 tablet (20 mg total) by mouth daily with breakfast for 5 days.   spironolactone (ALDACTONE) 25 MG tablet Take 1 tablet (25 mg total) by mouth daily.   Facility-Administered Encounter Medications as of 11/06/2022  Medication   ketorolac (TORADOL) 30 MG/ML injection 30 mg    Review of Systems  Review of Systems  Constitutional: Negative.   HENT: Negative.    Respiratory:  Negative for cough and shortness of breath.   Cardiovascular: Negative.   Gastrointestinal: Negative.   Musculoskeletal:  Positive for back pain.  Allergic/Immunologic: Negative.   Neurological: Negative.   Psychiatric/Behavioral: Negative.       Objective:   BP 126/71   Pulse 81   Temp (!) 97 F (36.1 C)   Wt 244 lb (110.7 kg)   SpO2 96%   BMI 44.63 kg/m   Wt Readings from Last 5 Encounters:  11/06/22 244 lb (110.7 kg)  11/05/22 245 lb 9.6 oz (111.4 kg)  09/18/22 243 lb 6.4 oz (110.4 kg)  06/12/22 235 lb (106.6 kg)  05/27/22 233 lb (105.7 kg)     Physical Exam Vitals and nursing note reviewed.  Constitutional:      General: She is not in acute distress.    Appearance: She is well-developed.  Cardiovascular:     Rate and Rhythm: Normal rate  and regular rhythm.  Pulmonary:     Effort: Pulmonary effort is normal.     Breath sounds: Normal breath sounds.  Neurological:     Mental Status: She is alert and oriented to person, place, and time.       Assessment & Plan:   Acute midline thoracic back pain -     predniSONE; Take 1 tablet (20 mg total) by mouth daily with breakfast for 5 days.  Dispense: 5 tablet; Refill: 0 -     DG Chest 2 View -     DG Thoracic Spine 2 View -     Ketorolac Tromethamine  History of URI (upper respiratory infection) -     DG Chest 2 View -     DG Thoracic Spine 2 View     Return in about 3 months (around 02/06/2023).   Ivonne Andrew,  NP 11/06/2022

## 2022-11-06 NOTE — Patient Instructions (Signed)
1. Acute midline thoracic back pain  - predniSONE (DELTASONE) 20 MG tablet; Take 1 tablet (20 mg total) by mouth daily with breakfast for 5 days.  Dispense: 5 tablet; Refill: 0 - DG Chest 2 View - DG Thoracic Spine 2 View - ketorolac (TORADOL) 30 MG/ML injection 30 mg  2. History of URI (upper respiratory infection)  - DG Chest 2 View - DG Thoracic Spine 2 View  Follow up:  Follow up in 2 months

## 2022-11-06 NOTE — Addendum Note (Signed)
Addended by: Renelda Loma on: 11/06/2022 03:51 PM   Modules accepted: Orders

## 2022-11-07 NOTE — Progress Notes (Signed)
Heart and Vascular Care Navigation  11/07/2022  Nicole Parker 06-07-1980 [id number]  Reason for Referral: uninsured, medication assistance Patient is participating in a Managed Medicaid Plan: No, self pay only  Engaged with patient by telephone for initial visit for Heart and Vascular Care Coordination.                                                                                                   Assessment:          LCSW spoke with pt at [phone] with assistance of Margurite Auerbach, [place] language interpreter [phone]. Introduced self, role, reason for call. Previously had not been able to assist with more than Farxiga PAP since pt did not complete full application/bring in full needed documents. Pt confirmed home address the same, husband is emergency contact. She requests assistance applications be sent to her home address. No additional concerns noted at this time around transportation, housing, food,etc.                              HRT/VAS Care Coordination     Patients Home Cardiology Office [facility]   Outpatient Care Team Social Worker   Social Worker Name: [name], [place], [phone]   Living arrangements for the past 2 months Single Family Home   Lives with: Adult Children; Spouse; Minor Children   Patient Current Insurance Coverage Self-Pay   Patient Has Concern With Paying Medical Bills Yes   Patient Concerns With Medical Bills no insurance   Medical Bill Referrals: [facility] financial assistance;  orange card   Does Patient Have Prescription Coverage? No   Patient Prescription Assistance Programs [place] [facility]; Patient Assistance Programs   Home Assistive Devices/Equipment None   DME Agency NA   [facility] Agency NA       Social History:                                                                             SDOH Screenings   Food Insecurity: No Food Insecurity (11/07/2022)  Housing: Low Risk  (11/07/2022)  Transportation Needs: No Transportation Needs  (11/07/2022)  Utilities: Not At Risk (11/07/2022)  Alcohol Screen: Low Risk  (07/05/2021)  Depression (PHQ2-9): Low Risk  (06/12/2022)  Financial Resource Strain: Medium Risk (11/07/2022)  Tobacco Use: Low Risk  (11/06/2022)  Health Literacy: Adequate Health Literacy (11/07/2022)    SDOH Interventions: Financial Resources:  Financial Strain Interventions: Other (Comment) (mailed [employer], [employer], Farxiga PAP and [employer]) Editor, commissioning for [employer] Program  Food Insecurity:  Food Insecurity Interventions: Intervention Not Indicated  Housing Insecurity:  Housing Interventions: Intervention Not Indicated  Transportation:   Transportation Interventions: Intervention Not Indicated    Other Care Navigation Interventions:     Provided Pharmacy  assistance resources [place] [facility], Patient Assistance Programs   Follow-up plan:   LCSW mailed the following: my card, [employer] application, [employer], [employer] application and [place] Patient Assistance renewal form. I included instructions regarding which applications can be submitted where. Will f/u to ensure paperwork received.

## 2022-11-13 ENCOUNTER — Telehealth: Payer: Self-pay | Admitting: Licensed Clinical Social Worker

## 2022-11-13 NOTE — Telephone Encounter (Signed)
H&V Care Navigation CSW Progress Note  Clinical Social Worker contacted patient by phone to f/u on assistance applications mailed last week. Was able to reach pt at [phone], with assistance of Spanish language interpreter [place], [phone]. She shares she doesn't have a key to the mail so will try and contact someone that does and see if received today. Agreeable to me f/u Friday, 10/11 at 3:30pm to see if received and answer any additional questions.   Patient is participating in a Managed Medicaid Plan:  No, self pay only  SDOH Screenings   Food Insecurity: No Food Insecurity (11/07/2022)  Housing: Low Risk  (11/07/2022)  Transportation Needs: No Transportation Needs (11/07/2022)  Utilities: Not At Risk (11/07/2022)  Alcohol Screen: Low Risk  (07/05/2021)  Depression (PHQ2-9): Low Risk  (06/12/2022)  Financial Resource Strain: Medium Risk (11/07/2022)  Tobacco Use: Low Risk  (11/06/2022)  Health Literacy: Adequate Health Literacy (11/07/2022)   [name], MSW, LCSW Clinical Social Worker II [facility] Heart/Vascular Care Navigation  [phone]- work cell phone (preferred) [phone]- desk phone

## 2022-11-22 ENCOUNTER — Telehealth (HOSPITAL_BASED_OUTPATIENT_CLINIC_OR_DEPARTMENT_OTHER): Payer: Self-pay | Admitting: Licensed Clinical Social Worker

## 2022-11-22 NOTE — Telephone Encounter (Signed)
H&V Care Navigation CSW Progress Note  Clinical Social Worker contacted patient by phone to f/u on assistance applications mailed to her. With assistance of Otilio Miu, Spanish language interpreter [phone] was able to reach her at [phone]. Confirmed she received applications, has needed documents and is working on completing them. She has [name]' contact information to contact him to assist with completion/submission. Will follow, remain available as needed.  Patient is participating in a Managed Medicaid Plan:  No, self pay only  SDOH Screenings   Food Insecurity: No Food Insecurity (11/07/2022)  Housing: Low Risk  (11/07/2022)  Transportation Needs: No Transportation Needs (11/07/2022)  Utilities: Not At Risk (11/07/2022)  Alcohol Screen: Low Risk  (07/05/2021)  Depression (PHQ2-9): Low Risk  (06/12/2022)  Financial Resource Strain: Medium Risk (11/07/2022)  Tobacco Use: Low Risk  (11/06/2022)  Health Literacy: Adequate Health Literacy (11/07/2022)    [name], MSW, LCSW Clinical Social Worker II [facility] Heart/Vascular Care Navigation  [phone]- work cell phone (preferred) [phone]- desk phone

## 2022-11-25 ENCOUNTER — Ambulatory Visit (HOSPITAL_COMMUNITY): Payer: Self-pay

## 2022-11-27 ENCOUNTER — Encounter: Payer: Self-pay | Admitting: Cardiology

## 2022-11-27 ENCOUNTER — Ambulatory Visit (HOSPITAL_COMMUNITY): Payer: Self-pay | Attending: Cardiovascular Disease

## 2022-11-27 DIAGNOSIS — I5042 Chronic combined systolic (congestive) and diastolic (congestive) heart failure: Secondary | ICD-10-CM | POA: Insufficient documentation

## 2022-11-27 LAB — ECHOCARDIOGRAM COMPLETE
Area-P 1/2: 3.61 cm2
S' Lateral: 3.7 cm

## 2022-11-28 ENCOUNTER — Telehealth: Payer: Self-pay | Admitting: Emergency Medicine

## 2022-11-28 DIAGNOSIS — I7121 Aneurysm of the ascending aorta, without rupture: Secondary | ICD-10-CM

## 2022-11-28 NOTE — Telephone Encounter (Signed)
  Thurmon Fair, MD 11/27/2022  4:11 PM EDT Back to Top    Heart pumping function has improved a little bit, although it is still slightly less than normal (normal LVEF> 55%, her EF 45-50%).   How is the blood pressure running now? 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  Will reevaluate that with a CT scan in a year.     Called this patient with the help of Spanish Interpreter [phone]: Gave the information above. She reports that she did keep a log of her BP and she will bring to the office tomorrow=11/29/22. I asked her if they were all under 140/90,she reports that they were. She says she feels fine, she has not been feeling bad at all.   Informed her that a CT will be ordered to reassess the dilation of the ascending aorta and someone will call her to schedule this once it gets closer to time. Location will be at [facility].  She verbalized understanding of all the information, all questions answered and does not have any further questions at this time.

## 2022-11-29 ENCOUNTER — Telehealth: Payer: Self-pay | Admitting: Cardiovascular Disease

## 2022-11-29 ENCOUNTER — Telehealth: Payer: Self-pay | Admitting: Nurse Practitioner

## 2022-11-29 NOTE — Telephone Encounter (Signed)
Paper Work Dropped Off: Patient assistance with medication and patient [facility] bills  Date: 11/29/2022  Location of paper:  Dr [employer]

## 2022-11-29 NOTE — Telephone Encounter (Signed)
Per Rutherford Nail, put in her office instead of Dr [employer]. Rutherford Nail stated she would let Orpah Cobb- RN know patient dropped off paperwork.

## 2022-11-29 NOTE — Telephone Encounter (Signed)
H&V Care Navigation CSW Progress Note  Clinical Social Worker  contacted by patient access team  to let me know pt brought by patient assistance forms. I requested they be left face down on my desk in my office. I will return to NL on Tuesday. I alerted Orpah Cobb, RN, to check and see if [name] app/supporting financial documents there then return to desk. Pt had been instructed to submit with [name], Artist but will discuss with her if all documents ready.  Florentina Addison has made copies and original, per RN, returned to desk. Will review others when I return to office.   Patient is participating in a Managed Medicaid Plan:  no, self pay only  SDOH Screenings   Food Insecurity: No Food Insecurity (11/07/2022)  Housing: Low Risk  (11/07/2022)  Transportation Needs: No Transportation Needs (11/07/2022)  Utilities: Not At Risk (11/07/2022)  Alcohol Screen: Low Risk  (07/05/2021)  Depression (PHQ2-9): Low Risk  (06/12/2022)  Financial Resource Strain: Medium Risk (11/07/2022)  Tobacco Use: Low Risk  (11/06/2022)  Health Literacy: Adequate Health Literacy (11/07/2022)    [name], MSW, LCSW Clinical Social Worker II [facility] Health Heart/Vascular Care Navigation  [phone]- work cell phone (preferred) [phone]- desk phone

## 2022-11-29 NOTE — Telephone Encounter (Signed)
Caller & Relationship to patient:  MRN #  [id number]   Call Back Number:   Date of Last Office Visit: 11/06/2022     Date of Next Office Visit: 02/07/2023    Medication(s) to be Refilled: Metformin, carvedilol  Preferred Pharmacy:   ** Please notify patient to allow 48-72 hours to process** **Let patient know to contact pharmacy at the end of the day to make sure medication is ready. ** **If patient has not been seen in a year or longer, book an appointment **Advise to use MyChart for refill requests OR to contact their pharmacy

## 2022-12-03 ENCOUNTER — Other Ambulatory Visit: Payer: Self-pay

## 2022-12-03 ENCOUNTER — Telehealth: Payer: Self-pay | Admitting: Licensed Clinical Social Worker

## 2022-12-03 MED ORDER — CARVEDILOL 6.25 MG PO TABS: 6.2500 mg | ORAL_TABLET | Freq: Two times a day (BID) | ORAL | 0 refills | Status: DC

## 2022-12-03 MED ORDER — METFORMIN HCL 1000 MG PO TABS: 1000.0000 mg | ORAL_TABLET | Freq: Two times a day (BID) | ORAL | 0 refills | Status: DC

## 2022-12-03 NOTE — Telephone Encounter (Signed)
Done KH 

## 2022-12-03 NOTE — Telephone Encounter (Signed)
H&V Care Navigation CSW Progress Note  Clinical Social Worker  reviewed documents brought  to the office. Included is a Haematologist, [employer] and [employer]. None are completed where indicated by this writer, nothing but demographics completed which were also done by this Clinical research associate. Also received, first two pages of pt 1040 tax return and a few pay stubs for pt, none for spouse. Attempted to reach pt to clarify these again are incorrect. No answer this morning. Left message with pt at [phone] with assistance of Spanish language interpreter Ephriam Knuckles [phone]. Will re-attempt again as able to reach pt.   Patient is participating in a Managed Medicaid Plan:  No, self pay only  SDOH Screenings   Food Insecurity: No Food Insecurity (11/07/2022)  Housing: Low Risk  (11/07/2022)  Transportation Needs: No Transportation Needs (11/07/2022)  Utilities: Not At Risk (11/07/2022)  Alcohol Screen: Low Risk  (07/05/2021)  Depression (PHQ2-9): Low Risk  (06/12/2022)  Financial Resource Strain: Medium Risk (11/07/2022)  Tobacco Use: Low Risk  (11/06/2022)  Health Literacy: Adequate Health Literacy (11/07/2022)    [name], MSW, LCSW Clinical Social Worker II [facility] Health Heart/Vascular Care Navigation  [phone]- work cell phone (preferred) [phone]- desk phone

## 2022-12-04 ENCOUNTER — Telehealth: Payer: Self-pay | Admitting: Emergency Medicine

## 2022-12-04 ENCOUNTER — Telehealth: Payer: Self-pay | Admitting: Licensed Clinical Social Worker

## 2022-12-04 NOTE — Telephone Encounter (Signed)
Pt assistance application faxed for [place]

## 2022-12-05 NOTE — Telephone Encounter (Signed)
H&V Care Navigation CSW Progress Note  Clinical Social Worker contacted patient by phone to f/u on assistance applications. Included is a Haematologist, [employer] and [employer]. None are completed where indicated by this writer, nothing but demographics completed which were also done by this Clinical research associate. Also received, first two pages of pt 1040 tax return and a few pay stubs for pt, none for spouse.   Attempted to reach pt to clarify these again are incorrect, was able to reach her at [phone] with assistance of Spanish language interpreter Myra [phone]. Pt was at work, requested I call her back later.  Re-attempted her again as requested an hour later at the same number above. Was able to reach her again with assistance of Maurine Minister, [place] language interpreter [phone]. I shared what was missing and that I will mail it all back to her to complete and bring to [name], Artist to submit. Will make sure she has received it again, confirmed again she is able to read applications in Spanish and will complete highlighted portions. She continues to have access to her mail box.    Patient is participating in a Managed Medicaid Plan:  No, self pay only  SDOH Screenings   Food Insecurity: No Food Insecurity (11/07/2022)  Housing: Low Risk  (11/07/2022)  Transportation Needs: No Transportation Needs (11/07/2022)  Utilities: Not At Risk (11/07/2022)  Alcohol Screen: Low Risk  (07/05/2021)  Depression (PHQ2-9): Low Risk  (06/12/2022)  Financial Resource Strain: Medium Risk (11/07/2022)  Tobacco Use: Low Risk  (11/06/2022)  Health Literacy: Adequate Health Literacy (11/07/2022)    [name], MSW, LCSW Clinical Social Worker II [facility] Health Heart/Vascular Care Navigation  [phone]- work cell phone (preferred) [phone]- desk phone

## 2022-12-18 ENCOUNTER — Telehealth: Payer: Self-pay | Admitting: Cardiovascular Disease

## 2022-12-18 NOTE — Telephone Encounter (Signed)
Patient dropped off assistance forms/information  In providers box

## 2022-12-18 NOTE — Telephone Encounter (Signed)
H&V Care Navigation CSW Progress Note  Reached out to RN and Patient Access teams to request paperwork all be moved to my desk as it is for our team and not RN/MD team.    Clinical Social Worker contacted patient by phone to f/u on paperwork left. Called and was able to reach her at [phone] with assistance of Spanish language interpreter [place], #[phone].  Let her know I noted it had been dropped off and would review it when back in office tomorrow. Pt is community clinic pt and needed to submit through [name], Artist. She is amenable to me arranging an appointment for her with [name] once I have confirmed all paperwork received. Pt will be contacted tomorrow at 3pm to provide update.  Patient is participating in a Managed Medicaid Plan:  no, self pay only  SDOH Screenings   Food Insecurity: No Food Insecurity (11/07/2022)  Housing: Low Risk  (11/07/2022)  Transportation Needs: No Transportation Needs (11/07/2022)  Utilities: Not At Risk (11/07/2022)  Alcohol Screen: Low Risk  (07/05/2021)  Depression (PHQ2-9): Low Risk  (06/12/2022)  Financial Resource Strain: Medium Risk (11/07/2022)  Tobacco Use: Low Risk  (11/06/2022)  Health Literacy: Adequate Health Literacy (11/07/2022)   [name], MSW, LCSW Clinical Social Worker II [facility] Health Heart/Vascular Care Navigation  [phone]- work cell phone (preferred) [phone]- desk phone

## 2022-12-19 ENCOUNTER — Ambulatory Visit: Payer: Self-pay | Admitting: Nurse Practitioner

## 2022-12-19 NOTE — Telephone Encounter (Addendum)
H&V Care Navigation CSW Progress Note  Clinical Social Worker contacted patient by phone to f/u on paperwork brought to office- I was able to reach her with assistance of Spanish language interpreter Fausto Skillern [phone], shared that it currently is not fully complete. Missing various items highlighted and need proof of address/photo ID. Pt states understanding, will mail back to her- emphasized again not to bring to the cardiology office, that she must bring it to [facility], Artist. Pt states understanding. Will hold on to the [facility] application and attempt to submit with documents brought.   Patient is participating in a Managed Medicaid Plan:  No, self pay only  SDOH Screenings   Food Insecurity: No Food Insecurity (11/07/2022)  Housing: Low Risk  (11/07/2022)  Transportation Needs: No Transportation Needs (11/07/2022)  Utilities: Not At Risk (11/07/2022)  Alcohol Screen: Low Risk  (07/05/2021)  Depression (PHQ2-9): Low Risk  (06/12/2022)  Financial Resource Strain: Medium Risk (11/07/2022)  Tobacco Use: Low Risk  (11/06/2022)  Health Literacy: Adequate Health Literacy (11/07/2022)   [name], MSW, LCSW Clinical Social Worker II [facility] Health Heart/Vascular Care Navigation  [phone]- work cell phone (preferred) [phone]- desk phone

## 2023-01-07 ENCOUNTER — Telehealth: Payer: Self-pay | Admitting: Licensed Clinical Social Worker

## 2023-01-07 NOTE — Telephone Encounter (Signed)
H&V Care Navigation CSW Progress Note  Clinical Social Worker contacted patient by phone to f/u on [employer] and [employer] which had been returned via mail to pt and she had been directed what was missing and how to re-apply with [name], Artist. No answer this afternoon to confirm she understands. Left voicemail requesting return call with assistance of Spanish language interpreter [place]. I will reattempt to reach her one more time.   Note [place] also [place] with [name] 12/06/2022-12/06/2023.   Patient is participating in a Managed Medicaid Plan:  No, self pay only  SDOH Screenings   Food Insecurity: No Food Insecurity (11/07/2022)  Housing: Low Risk  (11/07/2022)  Transportation Needs: No Transportation Needs (11/07/2022)  Utilities: Not At Risk (11/07/2022)  Alcohol Screen: Low Risk  (07/05/2021)  Depression (PHQ2-9): Low Risk  (06/12/2022)  Financial Resource Strain: Medium Risk (11/07/2022)  Tobacco Use: Low Risk  (11/06/2022)  Health Literacy: Adequate Health Literacy (11/07/2022)    [name], MSW, LCSW Clinical Social Worker II [facility] Health Heart/Vascular Care Navigation  [phone]- work cell phone (preferred) [phone]- desk phone

## 2023-01-07 NOTE — Telephone Encounter (Signed)
H&V Care Navigation CSW Progress Note  Clinical Social Worker  noted approval on pt media tab  for [place].  Will be mailed medication by [place] 12/06/2022-12/06/2023  Patient is participating in a Managed Medicaid Plan:  No, self pay only  SDOH Screenings   Food Insecurity: No Food Insecurity (11/07/2022)  Housing: Low Risk  (11/07/2022)  Transportation Needs: No Transportation Needs (11/07/2022)  Utilities: Not At Risk (11/07/2022)  Alcohol Screen: Low Risk  (07/05/2021)  Depression (PHQ2-9): Low Risk  (06/12/2022)  Financial Resource Strain: Medium Risk (11/07/2022)  Tobacco Use: Low Risk  (11/06/2022)  Health Literacy: Adequate Health Literacy (11/07/2022)    [name], MSW, LCSW Clinical Social Worker II [facility] Health Heart/Vascular Care Navigation  [phone]- work cell phone (preferred) [phone]- desk phone

## 2023-01-10 ENCOUNTER — Telehealth (HOSPITAL_BASED_OUTPATIENT_CLINIC_OR_DEPARTMENT_OTHER): Payer: Self-pay | Admitting: Licensed Clinical Social Worker

## 2023-01-10 NOTE — Telephone Encounter (Signed)
H&V Care Navigation CSW Progress Note  Clinical Social Worker contacted patient by phone to f/u on [employer] and [employer] which had been returned via mail to pt and she had been directed what was missing and how to re-apply with [name], Artist. No answer this afternoon again to confirm she understands. Left additional voicemail requesting return call with assistance of Spanish language interpreter [place] [phone]   Note pt also [place] with [name] 12/06/2022-12/06/2023.    Patient is participating in a Managed Medicaid Plan:  No, self pay only   SDOH Screenings   Food Insecurity: No Food Insecurity (11/07/2022)  Housing: Low Risk  (11/07/2022)  Transportation Needs: No Transportation Needs (11/07/2022)  Utilities: Not At Risk (11/07/2022)  Alcohol Screen: Low Risk  (07/05/2021)  Depression (PHQ2-9): Low Risk  (06/12/2022)  Financial Resource Strain: Medium Risk (11/07/2022)  Tobacco Use: Low Risk  (11/06/2022)  Health Literacy: Adequate Health Literacy (11/07/2022)    [name], MSW, LCSW Clinical Social Worker II [facility] Health Heart/Vascular Care Navigation  [phone]- work cell phone (preferred) [phone]- desk phone

## 2023-01-13 ENCOUNTER — Telehealth: Payer: Self-pay | Admitting: Licensed Clinical Social Worker

## 2023-01-13 NOTE — Telephone Encounter (Signed)
H&V Care Navigation CSW Progress Note  Clinical Social Worker  received message from [employer], patient access,  to assist pt who walked in to [place] clinic today with paperwork. She states that the number provided didn't reach [place]. LCSW sent information over to Amber to provide to pt and called the number myself to confirm it reached financial counselor.   Duanne Guess at [phone]. Let Amber know to let pt know the number is for [place]. Remain available as needed but pt needs to bring all documentation to [facility] during office hours to submit.    Patient is participating in a Managed Medicaid Plan:  No, self pay only  SDOH Screenings   Food Insecurity: No Food Insecurity (11/07/2022)  Housing: Low Risk  (11/07/2022)  Transportation Needs: No Transportation Needs (11/07/2022)  Utilities: Not At Risk (11/07/2022)  Alcohol Screen: Low Risk  (07/05/2021)  Depression (PHQ2-9): Low Risk  (06/12/2022)  Financial Resource Strain: Medium Risk (11/07/2022)  Tobacco Use: Low Risk  (11/06/2022)  Health Literacy: Adequate Health Literacy (11/07/2022)   [name], MSW, LCSW Clinical Social Worker II [facility] Health Heart/Vascular Care Navigation  [phone]- work cell phone (preferred) [phone]- desk phone

## 2023-02-07 ENCOUNTER — Encounter: Payer: Self-pay | Admitting: Nurse Practitioner

## 2023-02-07 ENCOUNTER — Ambulatory Visit (INDEPENDENT_AMBULATORY_CARE_PROVIDER_SITE_OTHER): Payer: Self-pay | Admitting: Nurse Practitioner

## 2023-02-07 VITALS — BP 152/92 | HR 88 | Temp 97.2°F | Wt 249.6 lb

## 2023-02-07 DIAGNOSIS — E119 Type 2 diabetes mellitus without complications: Secondary | ICD-10-CM

## 2023-02-07 LAB — POCT GLYCOSYLATED HEMOGLOBIN (HGB A1C): Hemoglobin A1C: 9.3 % — AB (ref 4.0–5.6)

## 2023-02-07 MED ORDER — METFORMIN HCL 1000 MG PO TABS: 1000.0000 mg | ORAL_TABLET | Freq: Two times a day (BID) | ORAL | 0 refills | Status: DC

## 2023-02-07 MED ORDER — CARVEDILOL 6.25 MG PO TABS: 6.2500 mg | ORAL_TABLET | Freq: Two times a day (BID) | ORAL | 0 refills | Status: DC

## 2023-02-07 NOTE — Progress Notes (Signed)
 Subjective   Patient ID: Nicole Parker, female    DOB: Mar 05, 1980, 43 y.o.   MRN: [id number]  Chief Complaint  Patient presents with   Follow-up    Referring provider: Oley Bascom RAMAN, NP  Nicole Parker is a 43 y.o. female with Past Medical History: No date: Gestational diabetes No date: Hypertension No date: Pregnancy induced hypertension   HPI  Patient presents today for diabetes and hypertension follow-up.  Patient is noncompliant with medications. A1C in office today is 9.3.  Patient's blood pressure was noted to be elevated in office today.  We discussed the importance of taking her blood pressure medicines as directed.  She states that she did forget to take the medications today.  She will take them when she gets home.  Patient does have a home blood pressure cuff and will monitor blood pressure over the next few days. We will place a referral to pharmacy for diabetic medication management.  Denies f/c/s, n/v/d, hemoptysis, PND, leg swelling Denies chest pain or edema   No Known Allergies  Immunization History  Administered Date(s) Administered   Influenza, Seasonal, Injecte, Preservative Fre 11/06/2022   Tdap 11/03/2013    Tobacco History: Social History   Tobacco Use  Smoking Status Never  Smokeless Tobacco Never   Counseling given: Not Answered   Outpatient Encounter Medications as of 02/07/2023  Medication Sig   dapagliflozin  propanediol (FARXIGA ) 10 MG TABS tablet Take 1 tablet (10 mg total) by mouth daily before breakfast.   losartan  (COZAAR ) 50 MG tablet Take 1 tablet (50 mg total) by mouth daily.   [DISCONTINUED] carvedilol  (COREG ) 6.25 MG tablet Take 1 tablet (6.25 mg total) by mouth 2 (two) times daily with a meal.   [DISCONTINUED] metFORMIN  (GLUCOPHAGE ) 1000 MG tablet Take 1 tablet (1,000 mg total) by mouth 2 (two) times daily with a meal.   carvedilol  (COREG ) 6.25 MG tablet Take 1 tablet (6.25 mg total) by mouth 2 (two) times daily  with a meal.   metFORMIN  (GLUCOPHAGE ) 1000 MG tablet Take 1 tablet (1,000 mg total) by mouth 2 (two) times daily with a meal.   spironolactone  (ALDACTONE ) 25 MG tablet Take 1 tablet (25 mg total) by mouth daily. (Patient not taking: Reported on 02/07/2023)   No facility-administered encounter medications on file as of 02/07/2023.    Review of Systems  Review of Systems  Constitutional: Negative.   HENT: Negative.    Cardiovascular: Negative.   Gastrointestinal: Negative.   Allergic/Immunologic: Negative.   Neurological: Negative.   Psychiatric/Behavioral: Negative.       Objective:   BP (!) 152/92   Pulse 88   Temp (!) 97.2 F (36.2 C)   Wt 249 lb 9.6 oz (113.2 kg)   SpO2 95%   BMI 45.65 kg/m   Wt Readings from Last 5 Encounters:  02/07/23 249 lb 9.6 oz (113.2 kg)  11/06/22 244 lb (110.7 kg)  11/05/22 245 lb 9.6 oz (111.4 kg)  09/18/22 243 lb 6.4 oz (110.4 kg)  06/12/22 235 lb (106.6 kg)     Physical Exam Vitals and nursing note reviewed.  Constitutional:      General: She is not in acute distress.    Appearance: She is well-developed.  Cardiovascular:     Rate and Rhythm: Normal rate and regular rhythm.  Pulmonary:     Effort: Pulmonary effort is normal.     Breath sounds: Normal breath sounds.  Neurological:     Mental Status: She is alert and  oriented to person, place, and time.       Assessment & Plan:   Type 2 diabetes mellitus without complication, without long-term current use of insulin  (HCC) -     POCT glycosylated hemoglobin (Hb A1C) -     AMB Referral VBCI Care Management  Other orders -     Carvedilol ; Take 1 tablet (6.25 mg total) by mouth 2 (two) times daily with a meal.  Dispense: 60 tablet; Refill: 0 -     metFORMIN  HCl; Take 1 tablet (1,000 mg total) by mouth 2 (two) times daily with a meal.  Dispense: 60 tablet; Refill: 0     Return in about 3 months (around 05/08/2023).   Bascom GORMAN Borer, NP 02/07/2023

## 2023-02-07 NOTE — Patient Instructions (Signed)
 1. Type 2 diabetes mellitus without complication, without long-term current use of insulin (HCC) (Primary)  - POCT glycosylated hemoglobin (Hb A1C) - AMB Referral VBCI Care Management

## 2023-02-14 ENCOUNTER — Telehealth: Payer: Self-pay

## 2023-02-14 NOTE — Progress Notes (Signed)
 Care Guide Pharmacy Note  02/14/2023 Name: Nicole Parker MRN: [id number] DOB: April 18, 1980  Referred By: Oley Bascom RAMAN, NP Reason for referral: Care Coordination (Outreach to schedule with Pharm d)   Nicole Parker is a 43 y.o. year old female who is a primary care patient of Oley Bascom RAMAN, NP.  Nicole Parker was referred to the pharmacist for assistance related to: DMII  An unsuccessful telephone outreach was attempted today to contact the patient who was referred to the pharmacy team for assistance with medication management. Additional attempts will be made to contact the patient.  Jeoffrey Buffalo , RMA     [facility] Health  [facility] - Day One Residence Guide  Direct Dial: [phone]  Website: [email or web address]

## 2023-03-06 ENCOUNTER — Telehealth: Payer: Self-pay

## 2023-03-06 NOTE — Progress Notes (Signed)
   03/06/2023  Patient ID: [name], female   DOB: 10/25/1980, 43 y.o.   MRN: [id number]  Attempted to contact patient for referral for medication management. Will attempt to outreach patient again in 3-5 business days.   Harlon Flor, PharmD Clinical Pharmacist  [phone]

## 2023-03-13 NOTE — Progress Notes (Signed)
   03/13/2023  Patient ID: Nicole Parker, female   DOB: 04-22-1980, 43 y.o.   MRN: [id number]  Attempted to contact patient for referral for medication management. Will attempt to outreach patient again in 7-10 business days.   Heather Factor, PharmD Clinical Pharmacist  [phone]

## 2023-03-20 NOTE — Progress Notes (Signed)
   03/20/2023  Patient ID: [name], female   DOB: 07/31/1980, 43 y.o.   MRN: [id number]  A third unsuccesful telephone outreach was attempted today to contact the patient who was referred to the pharmacy for assistance with medication management. The Population Health team is pleased to engage with this patient at any time in the future upon receipt of referral and should he/she be interested in assistance from the [facility] team.   Harlon Flor, PharmD Clinical Pharmacist  [phone]

## 2023-04-18 ENCOUNTER — Ambulatory Visit: Payer: Self-pay | Admitting: Nurse Practitioner

## 2023-04-24 ENCOUNTER — Ambulatory Visit: Payer: Self-pay | Admitting: Nurse Practitioner

## 2023-05-09 ENCOUNTER — Encounter: Payer: Self-pay | Admitting: Nurse Practitioner

## 2023-05-09 ENCOUNTER — Ambulatory Visit (INDEPENDENT_AMBULATORY_CARE_PROVIDER_SITE_OTHER): Payer: Self-pay | Admitting: Nurse Practitioner

## 2023-05-09 VITALS — BP 164/94 | HR 72 | Temp 98.7°F | Wt 241.0 lb

## 2023-05-09 DIAGNOSIS — N920 Excessive and frequent menstruation with regular cycle: Secondary | ICD-10-CM

## 2023-05-09 DIAGNOSIS — I10 Essential (primary) hypertension: Secondary | ICD-10-CM

## 2023-05-09 DIAGNOSIS — E119 Type 2 diabetes mellitus without complications: Secondary | ICD-10-CM

## 2023-05-09 LAB — POCT GLYCOSYLATED HEMOGLOBIN (HGB A1C): Hemoglobin A1C: 9.5 % — AB (ref 4.0–5.6)

## 2023-05-09 MED ORDER — DAPAGLIFLOZIN PROPANEDIOL 10 MG PO TABS: 10.0000 mg | ORAL_TABLET | Freq: Every day | ORAL | Status: DC

## 2023-05-09 MED ORDER — LOSARTAN POTASSIUM 50 MG PO TABS: 50.0000 mg | ORAL_TABLET | Freq: Every day | ORAL | 3 refills | Status: DC

## 2023-05-09 MED ORDER — METFORMIN HCL 1000 MG PO TABS: 1000.0000 mg | ORAL_TABLET | Freq: Two times a day (BID) | ORAL | 0 refills | Status: DC

## 2023-05-09 MED ORDER — CARVEDILOL 6.25 MG PO TABS: 6.2500 mg | ORAL_TABLET | Freq: Two times a day (BID) | ORAL | 0 refills | Status: DC

## 2023-05-09 MED ORDER — SPIRONOLACTONE 25 MG PO TABS: 25.0000 mg | ORAL_TABLET | Freq: Every day | ORAL | 6 refills | Status: DC

## 2023-05-09 MED ORDER — CLONIDINE HCL 0.1 MG PO TABS
0.1000 mg | ORAL_TABLET | Freq: Once | ORAL | Status: DC
Start: 2023-05-09 — End: 2023-10-01

## 2023-05-09 NOTE — Patient Instructions (Signed)
 1. Type 2 diabetes mellitus without complication, without long-term current use of insulin (HCC) (Primary)  - POCT glycosylated hemoglobin (Hb A1C) - AMB Referral VBCI Care Management - CBC - Comprehensive metabolic panel with GFR  2. Primary hypertension  - cloNIDine (CATAPRES) tablet 0.1 mg - CBC - Comprehensive metabolic panel with GFR  3. Menorrhagia with regular cycle  - Ambulatory referral to Obstetrics / Gynecology - CBC - Comprehensive metabolic panel with GFR

## 2023-05-09 NOTE — Progress Notes (Signed)
 Subjective   Patient ID: Nicole Parker, female    DOB: February 10, 1980, 43 y.o.   MRN: [id number]  Chief Complaint  Patient presents with   Medical Management of Chronic Issues    Referring provider: Ivonne Andrew, NP  Nicole Parker is a 43 y.o. female with Past Medical History: No date: Gestational diabetes No date: Hypertension No date: Pregnancy induced hypertension  HPI  Patient presents today for diabetes and hypertension follow-up.  Patient is noncompliant with medications. A1C in office today is 9.5.  Patient's blood pressure was noted to be elevated in office today.  We discussed the importance of taking her blood pressure medicines as directed.  She states that she is out of spironolactone.  She will take them when she gets home.  Patient does have a home blood pressure cuff and will monitor blood pressure over the next few days. We will place a referral to pharmacy for diabetic medication management.  Denies f/c/s, n/v/d, hemoptysis, PND, leg swelling Denies chest pain or edema  Note: patient is having heavy periods -she would like referral to OB/GYN.   No Known Allergies  Immunization History  Administered Date(s) Administered   Influenza, Seasonal, Injecte, Preservative Fre 11/06/2022   Tdap 11/03/2013    Tobacco History: Social History   Tobacco Use  Smoking Status Never  Smokeless Tobacco Never   Counseling given: Not Answered   Outpatient Encounter Medications as of 05/09/2023  Medication Sig   [DISCONTINUED] carvedilol (COREG) 6.25 MG tablet Take 1 tablet (6.25 mg total) by mouth 2 (two) times daily with a meal.   [DISCONTINUED] dapagliflozin propanediol (FARXIGA) 10 MG TABS tablet Take 1 tablet (10 mg total) by mouth daily before breakfast.   [DISCONTINUED] losartan (COZAAR) 50 MG tablet Take 1 tablet (50 mg total) by mouth daily.   [DISCONTINUED] metFORMIN (GLUCOPHAGE) 1000 MG tablet Take 1 tablet (1,000 mg total) by mouth 2 (two) times  daily with a meal.   carvedilol (COREG) 6.25 MG tablet Take 1 tablet (6.25 mg total) by mouth 2 (two) times daily with a meal.   dapagliflozin propanediol (FARXIGA) 10 MG TABS tablet Take 1 tablet (10 mg total) by mouth daily before breakfast.   losartan (COZAAR) 50 MG tablet Take 1 tablet (50 mg total) by mouth daily.   metFORMIN (GLUCOPHAGE) 1000 MG tablet Take 1 tablet (1,000 mg total) by mouth 2 (two) times daily with a meal.   spironolactone (ALDACTONE) 25 MG tablet Take 1 tablet (25 mg total) by mouth daily.   [DISCONTINUED] spironolactone (ALDACTONE) 25 MG tablet Take 1 tablet (25 mg total) by mouth daily. (Patient not taking: Reported on 05/09/2023)   [DISCONTINUED] spironolactone (ALDACTONE) 25 MG tablet Take 1 tablet (25 mg total) by mouth daily.   Facility-Administered Encounter Medications as of 05/09/2023  Medication   cloNIDine (CATAPRES) tablet 0.1 mg    Review of Systems  Review of Systems  Constitutional: Negative.   HENT: Negative.    Cardiovascular: Negative.   Gastrointestinal: Negative.   Allergic/Immunologic: Negative.   Neurological: Negative.   Psychiatric/Behavioral: Negative.       Objective:   BP (!) 164/94   Pulse 72   Temp 98.7 F (37.1 C) (Oral)   Wt 241 lb (109.3 kg)   SpO2 98%   BMI 44.08 kg/m   Wt Readings from Last 5 Encounters:  05/09/23 241 lb (109.3 kg)  02/07/23 249 lb 9.6 oz (113.2 kg)  11/06/22 244 lb (110.7 kg)  11/05/22 245 lb 9.6  oz (111.4 kg)  09/18/22 243 lb 6.4 oz (110.4 kg)     Physical Exam Vitals and nursing note reviewed.  Constitutional:      General: She is not in acute distress.    Appearance: She is well-developed.  Cardiovascular:     Rate and Rhythm: Normal rate and regular rhythm.  Pulmonary:     Effort: Pulmonary effort is normal.     Breath sounds: Normal breath sounds.  Neurological:     Mental Status: She is alert and oriented to person, place, and time.       Assessment & Plan:   Type 2 diabetes  mellitus without complication, without long-term current use of insulin (HCC) -     POCT glycosylated hemoglobin (Hb A1C) -     AMB Referral VBCI Care Management -     CBC -     Comprehensive metabolic panel with GFR  Primary hypertension -     cloNIDine HCl -     CBC -     Comprehensive metabolic panel with GFR  Menorrhagia with regular cycle -     Ambulatory referral to Obstetrics / Gynecology -     CBC -     Comprehensive metabolic panel with GFR  Other orders -     Carvedilol; Take 1 tablet (6.25 mg total) by mouth 2 (two) times daily with a meal.  Dispense: 60 tablet; Refill: 0 -     Dapagliflozin Propanediol; Take 1 tablet (10 mg total) by mouth daily before breakfast. -     Losartan Potassium; Take 1 tablet (50 mg total) by mouth daily.  Dispense: 90 tablet; Refill: 3 -     metFORMIN HCl; Take 1 tablet (1,000 mg total) by mouth 2 (two) times daily with a meal.  Dispense: 60 tablet; Refill: 0 -     Spironolactone; Take 1 tablet (25 mg total) by mouth daily.  Dispense: 30 tablet; Refill: 6     Return in about 3 months (around 08/08/2023).   Ivonne Andrew, NP 05/09/2023

## 2023-05-10 LAB — COMPREHENSIVE METABOLIC PANEL WITH GFR
ALT: 29 IU/L (ref 0–32)
AST: 35 IU/L (ref 0–40)
Albumin: 3.8 g/dL — ABNORMAL LOW (ref 3.9–4.9)
Alkaline Phosphatase: 103 IU/L (ref 44–121)
BUN/Creatinine Ratio: 19 (ref 9–23)
BUN: 17 mg/dL (ref 6–24)
Bilirubin Total: <0.2 mg/dL (ref 0.0–1.2)
CO2: 20 mmol/L (ref 20–29)
Calcium: 9.3 mg/dL (ref 8.7–10.2)
Chloride: 101 mmol/L (ref 96–106)
Creatinine, Ser: 0.89 mg/dL (ref 0.57–1.00)
Globulin, Total: 3 g/dL (ref 1.5–4.5)
Glucose: 215 mg/dL — ABNORMAL HIGH (ref 70–99)
Potassium: 4.1 mmol/L (ref 3.5–5.2)
Sodium: 138 mmol/L (ref 134–144)
Total Protein: 6.8 g/dL (ref 6.0–8.5)
eGFR: 83 mL/min/{1.73_m2} (ref >59)

## 2023-05-10 LAB — CBC
Hematocrit: 33.6 % — ABNORMAL LOW (ref 34.0–46.6)
Hemoglobin: 9.6 g/dL — ABNORMAL LOW (ref 11.1–15.9)
MCH: 19.1 pg — ABNORMAL LOW (ref 26.6–33.0)
MCHC: 28.6 g/dL — ABNORMAL LOW (ref 31.5–35.7)
MCV: 67 fL — ABNORMAL LOW (ref 79–97)
Platelets: 326 10*3/uL (ref 150–450)
RBC: 5.03 x10E6/uL (ref 3.77–5.28)
RDW: 17.5 % — ABNORMAL HIGH (ref 11.7–15.4)
WBC: 5.7 10*3/uL (ref 3.4–10.8)

## 2023-05-16 ENCOUNTER — Telehealth: Payer: Self-pay | Admitting: *Deleted

## 2023-05-16 NOTE — Progress Notes (Signed)
 Care Guide Pharmacy Note  05/16/2023 Name: Nicole Parker MRN: [id number] DOB: Oct 21, 1980  Referred By: Nicole Andrew, NP Reason for referral: No chief complaint on file.   Nicole Parker is a 43 y.o. year old female who is a primary care patient of Nicole Andrew, NP.  Nicole Parker was referred to the pharmacist for assistance related to: DMII  An unsuccessful telephone outreach was attempted today to contact the patient who was referred to the pharmacy team for assistance with medication management. Additional attempts will be made to contact the patient.  Nicole Parker  [facility]  [facility], [facility] Guide  Direct Dial: [phone]  Fax [phone]

## 2023-05-19 NOTE — Progress Notes (Unsigned)
 Complex Care Management Note Care Guide Note  05/19/2023 Name: Nicole Parker MRN: [id number] DOB: 1980/02/20   Complex Care Management Outreach Attempts: A second unsuccessful outreach was attempted today to offer the patient with information about available complex care management services.  Follow Up Plan:  Additional outreach attempts will be made to offer the patient complex care management information and services.   Encounter Outcome:  No Answer  Barnie Bora  [facility]  [facility], [facility] Guide  Direct Dial: [phone]  Fax [phone]

## 2023-05-21 NOTE — Progress Notes (Signed)
 Care Guide Pharmacy Note  05/21/2023 Name: Nicole Parker MRN: [id number] DOB: Feb 02, 1981  Referred By: Jerrlyn Morel, NP Reason for referral: Complex Care Management (Initial outreach to schedule referral with Pharmacist Vallarie Gauze )   Nicole Parker is a 43 y.o. year old female who is a primary care patient of Jerrlyn Morel, NP.  Sheri Dillon was referred to the pharmacist for assistance related to: DMII  A third unsuccessful telephone outreach was attempted today to contact the patient who was referred to the pharmacy team for assistance with medication management. The Population Health team is pleased to engage with this patient at any time in the future upon receipt of referral and should he/she be interested in assistance from the Population Health team.  Barnie Bora  [facility]  [facility], [facility] Guide  Direct Dial: [phone]  Fax [phone]

## 2023-06-04 ENCOUNTER — Telehealth: Payer: Self-pay | Admitting: Pharmacy Technician

## 2023-06-04 NOTE — Telephone Encounter (Signed)
 PAP: Patient assistance application for Farxiga  has been approved by PAP Companies: [employer] from 06/04/23 to 02/04/24. Medication should be delivered to PAP Delivery: Home. For further shipping updates, please contact [employer] ([employer]) at [phone]. Patient ID is: [id number]

## 2023-06-11 ENCOUNTER — Telehealth: Payer: Self-pay | Admitting: Pharmacy Technician

## 2023-06-11 MED ORDER — DAPAGLIFLOZIN PROPANEDIOL 10 MG PO TABS: 10.0000 mg | ORAL_TABLET | Freq: Every day | ORAL | 1 refills | Status: DC

## 2023-06-11 NOTE — Telephone Encounter (Signed)
 Pt's medication was sent to pt's pharmacy as requested. Confirmation received.

## 2023-06-11 NOTE — Telephone Encounter (Signed)
 Please list the pharmacy that medication need to go to.

## 2023-06-11 NOTE — Telephone Encounter (Signed)
 Received refill request for this patient. I scanned a blank refill form in media under "blank refill form for Farxiga " if someone can send in a refill to [employer] please and thank you!

## 2023-07-31 ENCOUNTER — Telehealth: Payer: Self-pay | Admitting: Pharmacy Technician

## 2023-08-11 ENCOUNTER — Ambulatory Visit: Payer: Self-pay | Admitting: Nurse Practitioner

## 2023-09-08 ENCOUNTER — Ambulatory Visit (INDEPENDENT_AMBULATORY_CARE_PROVIDER_SITE_OTHER): Payer: Self-pay | Admitting: Nurse Practitioner

## 2023-09-08 ENCOUNTER — Encounter: Payer: Self-pay | Admitting: Nurse Practitioner

## 2023-09-08 DIAGNOSIS — E119 Type 2 diabetes mellitus without complications: Secondary | ICD-10-CM

## 2023-09-08 LAB — POCT GLYCOSYLATED HEMOGLOBIN (HGB A1C): Hemoglobin A1C: 10.3 % — AB (ref 4.0–5.6)

## 2023-09-08 MED ORDER — OMEPRAZOLE 20 MG PO CPDR: 20.0000 mg | DELAYED_RELEASE_CAPSULE | Freq: Every day | ORAL | 3 refills | Status: AC

## 2023-09-08 NOTE — Progress Notes (Signed)
 Subjective   Patient ID: Nicole Parker, female    DOB: 01-12-1981, 43 y.o.   MRN: [id number]  Chief Complaint  Patient presents with   Follow-up    Referring provider: Oley Bascom RAMAN, NP  Nicole Parker is a 43 y.o. female with Past Medical History: No date: Gestational diabetes No date: Hypertension No date: Pregnancy induced hypertension  HPI  Patient presents today for diabetes and hypertension follow-up.  Patient is noncompliant with medications. A1C in office today is 10.3.   We will place a referral to pharmacy for diabetic medication management. Will place referral to endocrinology. Denies f/c/s, n/v/d, hemoptysis, PND, leg swelling. Denies chest pain or edema.    No Known Allergies  Immunization History  Administered Date(s) Administered   Influenza, Seasonal, Injecte, Preservative Fre 11/06/2022   Tdap 11/03/2013    Tobacco History: Social History   Tobacco Use  Smoking Status Never  Smokeless Tobacco Never   Counseling given: Not Answered   Outpatient Encounter Medications as of 09/08/2023  Medication Sig   carvedilol  (COREG ) 6.25 MG tablet Take 1 tablet (6.25 mg total) by mouth 2 (two) times daily with a meal.   dapagliflozin  propanediol (FARXIGA ) 10 MG TABS tablet Take 1 tablet (10 mg total) by mouth daily before breakfast.   metFORMIN  (GLUCOPHAGE ) 1000 MG tablet Take 1 tablet (1,000 mg total) by mouth 2 (two) times daily with a meal.   omeprazole  (PRILOSEC) 20 MG capsule Take 1 capsule (20 mg total) by mouth daily.   spironolactone  (ALDACTONE ) 25 MG tablet Take 1 tablet (25 mg total) by mouth daily.   losartan  (COZAAR ) 50 MG tablet Take 1 tablet (50 mg total) by mouth daily.   Facility-Administered Encounter Medications as of 09/08/2023  Medication   cloNIDine  (CATAPRES ) tablet 0.1 mg    Review of Systems  Review of Systems  Constitutional: Negative.   HENT: Negative.    Cardiovascular: Negative.   Gastrointestinal: Negative.    Allergic/Immunologic: Negative.   Neurological: Negative.   Psychiatric/Behavioral: Negative.       Objective:   BP (!) 152/81   Pulse 88   Wt 238 lb 6.4 oz (108.1 kg)   SpO2 98%   BMI 43.60 kg/m   Wt Readings from Last 5 Encounters:  09/08/23 238 lb 6.4 oz (108.1 kg)  05/09/23 241 lb (109.3 kg)  02/07/23 249 lb 9.6 oz (113.2 kg)  11/06/22 244 lb (110.7 kg)  11/05/22 245 lb 9.6 oz (111.4 kg)     Physical Exam Vitals and nursing note reviewed.  Constitutional:      General: She is not in acute distress.    Appearance: She is well-developed.  Cardiovascular:     Rate and Rhythm: Normal rate and regular rhythm.  Pulmonary:     Effort: Pulmonary effort is normal.     Breath sounds: Normal breath sounds.  Neurological:     Mental Status: She is alert and oriented to person, place, and time.       Assessment & Plan:   Type 2 diabetes mellitus without complication, without long-term current use of insulin  (HCC) -     POCT glycosylated hemoglobin (Hb A1C) -     Microalbumin / creatinine urine ratio -     Ambulatory referral to Endocrinology -     AMB Referral VBCI Care Management  Other orders -     Omeprazole ; Take 1 capsule (20 mg total) by mouth daily.  Dispense: 30 capsule; Refill: 3  Return in about 3 months (around 12/09/2023).   Bascom GORMAN Borer, NP 09/08/2023

## 2023-09-09 LAB — MICROALBUMIN / CREATININE URINE RATIO
Creatinine, Urine: 55.8 mg/dL
Microalb/Creat Ratio: 102 mg/g{creat} — ABNORMAL HIGH (ref 0–29)
Microalbumin, Urine: 56.8 ug/mL

## 2023-09-10 ENCOUNTER — Telehealth: Payer: Self-pay

## 2023-09-10 ENCOUNTER — Ambulatory Visit: Payer: Self-pay | Admitting: Nurse Practitioner

## 2023-09-10 NOTE — Progress Notes (Signed)
 Care Guide Pharmacy Note  09/10/2023 Name: Nicole Parker MRN: [id number] DOB: October 25, 1980  Referred By: Oley Bascom RAMAN, NP Reason for referral: Complex Care Management (Outreach to schedule with Pharm d )   Nicole Parker is a 43 y.o. year old female who is a primary care patient of Oley Bascom RAMAN, NP.  Nicole Parker was referred to the pharmacist for assistance related to: DMII  Successful contact was made with the patient to discuss pharmacy services including being ready for the pharmacist to call at least 5 minutes before the scheduled appointment time and to have medication bottles and any blood pressure readings ready for review. The patient agreed to meet with the pharmacist via telephone visit on (date/time).10/01/2023  Jeoffrey Buffalo , RMA     [place]  [facility], [facility] Guide  Direct Dial: [phone]  Website: [email or web address]

## 2023-09-26 ENCOUNTER — Telehealth: Payer: Self-pay | Admitting: Pharmacy Technician

## 2023-09-26 MED ORDER — DAPAGLIFLOZIN PROPANEDIOL 10 MG PO TABS: 10.0000 mg | ORAL_TABLET | Freq: Every day | ORAL | 3 refills | Status: AC

## 2023-09-26 NOTE — Telephone Encounter (Signed)
 Received refill notification from [employer] for Farxiga :

## 2023-09-26 NOTE — Addendum Note (Signed)
 Addended by: Shandreka Dante L on: 09/26/2023 06:14 PM   Modules accepted: Orders

## 2023-09-26 NOTE — Telephone Encounter (Signed)
 Refill for Farxiga sent to [facility]

## 2023-09-29 ENCOUNTER — Telehealth: Payer: Self-pay | Admitting: Pharmacy Technician

## 2023-10-01 ENCOUNTER — Ambulatory Visit (INDEPENDENT_AMBULATORY_CARE_PROVIDER_SITE_OTHER): Payer: Self-pay

## 2023-10-01 ENCOUNTER — Other Ambulatory Visit: Payer: Self-pay

## 2023-10-01 VITALS — BP 156/84 | HR 88

## 2023-10-01 DIAGNOSIS — E119 Type 2 diabetes mellitus without complications: Secondary | ICD-10-CM

## 2023-10-01 DIAGNOSIS — I5042 Chronic combined systolic (congestive) and diastolic (congestive) heart failure: Secondary | ICD-10-CM

## 2023-10-01 MED ORDER — SPIRONOLACTONE 25 MG PO TABS
25.0000 mg | ORAL_TABLET | Freq: Every day | ORAL | 6 refills | Status: AC
  Filled 2023-10-01: qty 30, 30d supply, fill #0
  Filled 2023-11-25: qty 30, 30d supply, fill #1
  Filled 2024-01-20: qty 30, 30d supply, fill #2
  Filled 2024-03-02: qty 30, 30d supply, fill #3

## 2023-10-01 MED ORDER — VALSARTAN 80 MG PO TABS
80.0000 mg | ORAL_TABLET | Freq: Every day | ORAL | 3 refills | Status: AC
  Filled 2023-10-01: qty 30, 30d supply, fill #0
  Filled 2023-11-25: qty 30, 30d supply, fill #1
  Filled 2024-01-20: qty 30, 30d supply, fill #2

## 2023-10-01 MED ORDER — CARVEDILOL 6.25 MG PO TABS
6.2500 mg | ORAL_TABLET | Freq: Two times a day (BID) | ORAL | 3 refills | Status: AC
  Filled 2023-10-01: qty 60, 30d supply, fill #0
  Filled 2023-11-25: qty 60, 30d supply, fill #1
  Filled 2024-01-20: qty 60, 30d supply, fill #2

## 2023-10-01 MED ORDER — METFORMIN HCL 1000 MG PO TABS
1000.0000 mg | ORAL_TABLET | Freq: Two times a day (BID) | ORAL | 3 refills | Status: AC
  Filled 2023-10-01: qty 60, 30d supply, fill #0
  Filled 2023-11-25: qty 60, 30d supply, fill #1
  Filled 2024-01-20: qty 60, 30d supply, fill #2
  Filled 2024-03-02: qty 60, 30d supply, fill #3

## 2023-10-01 MED ORDER — TRULICITY 0.75 MG/0.5ML ~~LOC~~ SOAJ
0.7500 mg | SUBCUTANEOUS | 3 refills | Status: AC
  Filled 2023-10-01: qty 2, 28d supply, fill #0
  Filled 2023-11-25: qty 2, 28d supply, fill #1
  Filled 2024-01-20: qty 2, 28d supply, fill #2
  Filled 2024-03-02: qty 2, 28d supply, fill #3

## 2023-10-01 NOTE — Progress Notes (Signed)
 10/01/2023 Name: Nicole Parker MRN: [id number] DOB: November 19, 1980  Chief Complaint  Patient presents with   Diabetes   Hypertension    Nicole Parker is a 43 y.o. year old female who was referred for medication management by their primary care provider, Nicole Nicole RAMAN, NP. They presented for a face to face visit today.   They were referred to the pharmacist by their PCP for assistance in managing diabetes. 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    Subjective: Patient was last seen by PCP, Nicole Oley, NP, on 09/08/23. At last visit, BP was elevated to 152/81 mmHg, HR 88 bpm. A1C increased from 9.5% to 10.3%. She was referred to pharmacy for diabetes management. Patient has also historically followed with cardiology for HF management.  Today, patient presents in  good spirits and presents without  any assistance. Patient brought her medication bottles with her today for review.   Care Team: Primary Care Provider: Oley Nicole RAMAN, NP ; Next Scheduled Visit: 12/10/23 Cardiologist: Dr. Francyne; Next Scheduled Visit: needs to be scheduled ~ Oct 2025  Medication Access/Adherence  Current Pharmacy:  [facility] [address], [place] - [address] [place] [address] Phone: [phone] Fax: [phone]  [facility] - [place], [place] - [address] [place] [address] Phone: [phone] Fax: [phone]  [facility] - [facility] [address] [place] [address] Phone: [phone] Fax: [phone]   Patient reports affordability concerns with their medications: No  - reports they are affordable at [facility]. Patient reports access/transportation concerns to their pharmacy: No  - reports she could go to [facility] pharmacy to pick up medications if the prices would be reduced.   Patient reports adherence concerns with their medications:  Yes    Fill history - carvedilol  last filled Apr 2025 for 30ds - Farxiga  - current supply via [employer] - losartan  - filled 08/04/23 for 90ds but she brought bottle with pills from April 2025 - metformin  - filled April 2025 for 30ds - spironolactone  - filled May 2025 for 30ds  Reports that she usually takes her medications at night, but today she took them this morning. Forgets to take medications 2-3 times per week.  Reports that she gets numbness in her hand when she takes losartan , so that is one reason she does not take this medication regularly.  Reports that she only takes carvedilol  and metformin  once daily, if she takes them.    Diabetes:  Current medications: metformin  IR 1000 mg BID, Farxiga  10 mg daily Medications tried in the past: N/A  Current glucose readings:  Using glucometer; testing one time per week - recalls reading of 175 mg/dL in the afternoon.   Patient denies hypoglycemic s/sx including dizziness, shakiness, sweating. Patient denies hyperglycemic symptoms including polyuria, polydipsia, polyphagia, nocturia, blurred vision. Endorses neuropathy (needle poking in fingers and arms).   Current meal patterns: Eating two meals per day - Breakfast: Eats omelet around 10:30 - Supper: In the evening has larger meal rice, stew. - Snacks: no snacks - Drinks: Reports that she drinks > 8 bottles water per day. Has occasional Dr. Nunzio (estimates ~1 every 4 days).   Current physical activity: none currently  Current medication access support: No insurance  Heart Failure (EF 45-50% in Oct 2024):  Current medications:  ACEi/ARB/ARNI: losartan   50 mg daily (not taking consistently) SGLT2i: Farxiga  10 mg daily Beta blocker: carvedilol  6.25 mg BID (not taking consistently and/or once daily) Mineralocorticoid Receptor Antagonist: spironolactone  25 mg daily (not taking consistently Diuretic regimen: none  Current  home blood pressure readings: Reports SBP is always in the 160-170s  Patient reports volume overload signs or symptoms including shortness of breath overnight when she is laying down. Reports that she sleeps on 3 pillows at night. No LE on exam today.   Patient reports that she is having HA at home when she does not sleep well or is frustrated. Denies chest pain .  Current medication access support:  - Patient approved for [employer] Farxiga  through 02/04/24 - Reports that she would be willing and able to pick up medications from [facility] via [facility]   Objective:  BP Readings from Last 3 Encounters:  10/01/23 (!) 156/84  09/08/23 (!) 152/81  05/09/23 (!) 164/94    Lab Results  Component Value Date   HGBA1C 10.3 (A) 09/08/2023   HGBA1C 9.5 (A) 05/09/2023   HGBA1C 9.3 (A) 02/07/2023       Latest Ref Rng & Units 05/09/2023    3:55 PM 09/18/2022    4:15 PM 06/12/2022    8:59 AM  BMP  Glucose 70 - 99 mg/dL 784  765  867   BUN 6 - 24 mg/dL 17  22  17    Creatinine 0.57 - 1.00 mg/dL 9.10  9.34  9.34   BUN/Creat Ratio 9 - 23 19  34  26   Sodium 134 - 144 mmol/L 138  138  137   Potassium 3.5 - 5.2 mmol/L 4.1  3.5  4.2   Chloride 96 - 106 mmol/L 101  98  101   CO2 20 - 29 mmol/L 20  23  22    Calcium  8.7 - 10.2 mg/dL 9.3  8.7  9.3     Lab Results  Component Value Date   CHOL 173 06/12/2022   HDL 44 06/12/2022   LDLCALC 104 (H) 06/12/2022   TRIG 143 06/12/2022   CHOLHDL 3.9 06/12/2022    Medications Reviewed Today     Reviewed by Nicole Parker, RPH (Pharmacist) on 10/01/23 at 1728  Med List Status: <None>   Medication Order Taking? Sig Documenting Provider Last Dose Status Informant  carvedilol  (COREG ) 6.25 MG tablet [id number] Yes Take 1 tablet (6.25 mg total) by mouth 2 (two) times daily with a meal.  Patient taking differently: Take 1 tablet (6.25 mg total) by mouth 2 (two) times daily with a meal.   Nicole Nicole RAMAN, NP  Active      Discontinued 10/01/23 1436 (Completed Course)    dapagliflozin  propanediol (FARXIGA ) 10 MG TABS tablet [id number] Yes Take 1 tablet (10 mg total) by mouth daily before breakfast. Croitoru, Mihai, MD  Active   Dulaglutide  (TRULICITY ) 0.75 MG/0.5ML SOAJ [id number] Yes Inject 0.75 mg into the skin once a week. Nicole Nicole RAMAN, NP  Active    Discontinued 10/01/23 1433 (Side effect (s))            Med Note>> Nicole Parker, [facility]   10/01/2023  2:33 PM patient reports hand numbness when she takes this medication    metFORMIN  (GLUCOPHAGE ) 1000 MG tablet [id number] Yes Take 1 tablet (1,000 mg total) by mouth 2 (two) times daily with a meal.  Patient taking differently: Take 1 tablet (1,000 mg total) by mouth 2 (two) times daily with a meal.   Nicole Nicole RAMAN,  NP  Active   omeprazole  (PRILOSEC) 20 MG capsule [id number] Yes Take 1 capsule (20 mg total) by mouth daily. Nicole Nicole RAMAN, NP  Active   spironolactone  (ALDACTONE ) 25 MG tablet [id number] Yes Take 1 tablet (25 mg total) by mouth daily. Nicole Nicole RAMAN, NP  Active   valsartan  (DIOVAN ) 80 MG tablet [id number] Yes Take 1 tablet (80 mg total) by mouth daily. Nicole Nicole RAMAN, NP  Active               Assessment/Plan:   Diabetes: - Currently uncontrolled with most recent A1C of 10.3% above goal <7% and worsened from 9.5%. Medication adherence appears poor. Patient is a good candidate for treatment with GLP-1RA, like Trulicity , which is available via [facility] supply. She is agreeable to starting this medication and resuming metformin  as prescribed.  - Last UACR 09/08/23: 102 mg/g (previous 40 mg/g) - Patient denies personal or family history of multiple endocrine neoplasia type 2, medullary thyroid cancer; personal history of pancreatitis or gallbladder disease. - Reviewed long term cardiovascular and renal outcomes of uncontrolled blood sugar - Reviewed goal A1c, goal fasting, and goal 2 hour post prandial glucose - Reviewed dietary modifications including  utilizing the healthy plate method, limiting  portion size of carbohydrate foods, increasing intake of protein and non-starchy vegetables. Counseled patient to stay hydrated with water throughout the day. - Reviewed lifestyle modifications including: aiming for 150 minutes of moderate intensity exercise every week.  - Recommend to START Trulicity  0.75 mg weekly. Patient was extensively educated on storage, administration, and AE.   - Recommend to continue metformin  IR 1000 mg BID - Recommend to check glucose twice daily: fasting and 2-hr PPG . Counseled patient to bring glucometer or BG log to every appointment. - Next A1C due November 2025      Heart Failure: - Currently appropriately managed, however patient is not taking her medications as prescribed. Appears euvolemic on exam today. NYHA Class II-III symptoms given reported ShOB overnight. Will switch her from losartan  to valsartan  as she describes a side effect of hand numbness which is preventing her from taking losartan  as prescribed. She does not give a clear reason for nonadherence to her other medications. She states they have been affordable, but will transfer her to [facility] pharmacy to reduce cost of self-pay medications further. Emphasized importance of adherence to medications given dx of HF and currently reported symptoms of ShOB overnight.  - Reviewed appropriate blood pressure monitoring technique and reviewed goal blood pressure - Reviewed to weigh daily and when to contact cardiology with weight gain - Recommend to make the following changes:  GDMT: ACEi/ARB/ARNI: STOP losartan , START valsartan  80 mg daily. Will repeat BMP in ~4 weeks. SGLT2i: continue Farxiga  10 mg daily Beta blocker: continue carvedilol  6.25 mg BID ( Mineralocorticoid Receptor Antagonist: continue spironolactone  25 mg daily Diuretic regimen: none    Written patient instructions provided. Patient verbalized understanding of treatment plan.   Follow Up Plan:  Pharmacist in person 11/11/23 PCP clinic  visit 12/10/23   Lorain Baseman, PharmD [facility] [phone]

## 2023-10-01 NOTE — Patient Instructions (Addendum)
  Qu gusto verte hoy!  Tu nivel ideal de azcar en sangre es de 80-130 antes de comer y menos de 180 despus de comer. Por favor, contrlalo una vez al da por la maana y trae tu medidor a TODAS tus citas.  Cambios de medicacin:  COMIENZA con Trulicity  0.75 mg una vez a la semana. Recibirs una caja con 4 plumas que te durarn un mes. Una vez que se acabe, solicita un resurtido en la farmacia.  Este medicamento puede hacer que te sientas lleno ms rpido, as que deja de comer cuando te sientas lleno y planea comer porciones ms pequeas con ms frecuencia a lo largo Tax inspector. Intenta consumir protenas con cada comida o refrigerio.  SUPENDE el losartn y [facility] con valsartn 80 mg una vez al da para el corazn y la presin arterial.  Licensed conveyancer con todos los dems medicamentos de la [place].  Controla tu azcar en sangre en casa y lleva un registro (glucmetro o papel) para llevarlo a tu prxima cita.  Sigue con la dieta y el ejercicio. Procura una dieta rica en verduras, frutas y carnes magras (pollo, pavo, pescado). Intenta limitar el consumo de sal comiendo verduras frescas o congeladas (en lugar de enlatadas), enjuaga las verduras enlatadas antes de cocinarlas y no aadas sal a las comidas.  _________________________________________________________________________  It was nice to see you today!  Your goal blood sugar is 80-130 before eating and less than 180 after eating. Please check once daily in the morning and bring your meter to ALL your appointments.  Medication Changes:  START Trulicity  0.75 mg once weekly. You will get a box with 4 pens that will last you one month. Once this runs out, ask the pharmacy for a refill  This medicine can make you feel full more quickly so stop eating when you feel full and plan to eat smaller portions of food more frequently throughout the day. Try to eat protein with each meal or snack.  STOP losartan  and START valsartan  80 mg once  daily for your heart and blood pressure.   Continue all other medication the same.   Monitor blood sugars at home and keep a log (glucometer or piece of paper) to bring with you to your next visit.  Keep up the good work with diet and exercise. Aim for a diet full of vegetables, fruit and lean meats (chicken, [place], fish). Try to limit salt intake by eating fresh or frozen vegetables (instead of canned), rinse canned vegetables prior to cooking and do not add any additional salt to meals.

## 2023-10-10 ENCOUNTER — Other Ambulatory Visit: Payer: Self-pay

## 2023-10-15 ENCOUNTER — Encounter: Payer: Self-pay | Admitting: Cardiovascular Disease

## 2023-11-06 ENCOUNTER — Encounter: Payer: Self-pay | Admitting: Radiology

## 2023-11-06 ENCOUNTER — Other Ambulatory Visit: Payer: Self-pay

## 2023-11-11 ENCOUNTER — Ambulatory Visit: Payer: Self-pay

## 2023-11-11 NOTE — Progress Notes (Deleted)
 11/11/2023 Name: Nicole Parker MRN: [id number] DOB: 24-Oct-1980  No chief complaint on file.   Nicole Parker is a 43 y.o. year old female who was referred for medication management by their primary care provider, Oley Bascom RAMAN, NP. They presented for a face to face visit today.   They were referred to the pharmacist by their PCP for assistance in managing diabetes. 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    Subjective: Patient was last seen by PCP, Bascom Oley, NP, on 09/08/23. At last visit, BP was elevated to 152/81 mmHg, HR 88 bpm. A1C increased from 9.5% to 10.3%. She was referred to pharmacy for diabetes management. Patient has also historically followed with cardiology for HF management. She was seen for an in-person pharmacy visit on 10/01/23. She was agreeable to initiating Trulicity . We also switched losartan  to valsartan  due to patient reported side effect of hand numbness. Overall, patient reported poor medication adherence, but was willing to restart daily adherence.  Today, patient presents in  good spirits and presents without  any assistance. Patient brought her medication bottles with her today for review.   Farxiga  re-enrollment? BMP today LIBERATE?  Care Team: Primary Care Provider: Oley Bascom RAMAN, NP ; Next Scheduled Visit: 12/10/23 Cardiologist: Dr. Francyne; Next Scheduled Visit: needs to be scheduled ~ Oct 2025  Medication Access/Adherence  Current Pharmacy:  [facility] [address], [place] - [address] [place] [address] Phone: [phone] Fax: [phone]  [facility] - [place], [place] - [address] [place] [address] Phone: [phone] Fax: [phone]  [facility] - [facility] [address] [place] [address] Phone: [phone] Fax:  [phone]   Patient reports affordability concerns with their medications: No  - reports they are affordable at [facility]. Patient reports access/transportation concerns to their pharmacy: No  - reports she could go to [facility] pharmacy to pick up medications if the prices would be reduced.  Patient reports adherence concerns with their medications:  Yes    Fill history - carvedilol  last filled Apr 2025 for 30ds - Farxiga  - current supply via [employer] - losartan  - filled 08/04/23 for 90ds but she brought bottle with pills from April 2025 - metformin  - filled April 2025 for 30ds - spironolactone  - filled May 2025 for 30ds  Reports that she usually takes her medications at night, but today she took them this morning. Forgets to take medications 2-3 times per week.  Reports that she gets numbness in her hand when she takes losartan , so that is one reason she does not take this medication regularly.  Reports that she only takes carvedilol  and metformin  once daily, if she takes them.    Diabetes:  Current medications: metformin  IR 1000 mg BID, Farxiga  10 mg daily, Trulicity  0.75 mg weekly Medications tried in the past: N/A  Current glucose readings:  Using glucometer; testing one time per week - recalls reading of 175 mg/dL in the afternoon.   Patient denies hypoglycemic s/sx including dizziness, shakiness, sweating. Patient denies hyperglycemic symptoms including polyuria, polydipsia, polyphagia, nocturia, blurred vision. Endorses neuropathy (needle poking in fingers and arms).   Current meal patterns: Eating two meals per day - Breakfast: Eats omelet around 10:30 - Supper: In the evening has larger meal rice, stew. - Snacks: no snacks - Drinks: Reports  that she drinks > 8 bottles water per day. Has occasional Dr. Nunzio (estimates ~1 every 4 days).   Current physical activity: none currently  Current medication access support: No insurance  Heart Failure (EF 45-50% in Oct 2024):  Current  medications:  ACEi/ARB/ARNI: losartan  50 mg daily (not taking consistently) SGLT2i: Farxiga  10 mg daily Beta blocker: carvedilol  6.25 mg BID (not taking consistently and/or once daily) Mineralocorticoid Receptor Antagonist: spironolactone  25 mg daily (not taking consistently Diuretic regimen: none  Current home blood pressure readings: Reports SBP is always in the 160-170s  Patient reports volume overload signs or symptoms including shortness of breath overnight when she is laying down. Reports that she sleeps on 3 pillows at night. No LE on exam today.   Patient reports that she is having HA at home when she does not sleep well or is frustrated. Denies chest pain .  Current medication access support:  - Patient approved for AZMe Farxiga  through 02/04/24 - Reports that she would be willing and able to pick up medications from [facility] via [facility]   Objective:  BP Readings from Last 3 Encounters:  10/01/23 (!) 156/84  09/08/23 (!) 152/81  05/09/23 (!) 164/94    Lab Results  Component Value Date   HGBA1C 10.3 (A) 09/08/2023   HGBA1C 9.5 (A) 05/09/2023   HGBA1C 9.3 (A) 02/07/2023       Latest Ref Rng & Units 05/09/2023    3:55 PM 09/18/2022    4:15 PM 06/12/2022    8:59 AM  BMP  Glucose 70 - 99 mg/dL 784  765  867   BUN 6 - 24 mg/dL 17  22  17    Creatinine 0.57 - 1.00 mg/dL 9.10  9.34  9.34   BUN/Creat Ratio 9 - 23 19  34  26   Sodium 134 - 144 mmol/L 138  138  137   Potassium 3.5 - 5.2 mmol/L 4.1  3.5  4.2   Chloride 96 - 106 mmol/L 101  98  101   CO2 20 - 29 mmol/L 20  23  22    Calcium  8.7 - 10.2 mg/dL 9.3  8.7  9.3     Lab Results  Component Value Date   CHOL 173 06/12/2022   HDL 44 06/12/2022   LDLCALC 104 (H) 06/12/2022   TRIG 143 06/12/2022   CHOLHDL 3.9 06/12/2022    Medications Reviewed Today   Medications were not reviewed in this encounter       Assessment/Plan:   Diabetes: - Currently uncontrolled with most recent A1C of 10.3% above goal <7% and worsened  from 9.5%. Medication adherence appears poor. Patient is a good candidate for treatment with GLP-1RA, like Trulicity , which is available via [facility] supply. She is agreeable to starting this medication and resuming metformin  as prescribed.  - Last UACR 09/08/23: 102 mg/g (previous 40 mg/g) - Patient denies personal or family history of multiple endocrine neoplasia type 2, medullary thyroid cancer; personal history of pancreatitis or gallbladder disease. - Reviewed long term cardiovascular and renal outcomes of uncontrolled blood sugar - Reviewed goal A1c, goal fasting, and goal 2 hour post prandial glucose - Reviewed dietary modifications including  utilizing the healthy plate method, limiting portion size of carbohydrate foods, increasing intake of protein and non-starchy vegetables. Counseled patient to stay hydrated with water throughout the day. - Reviewed lifestyle modifications including: aiming for 150 minutes of moderate intensity exercise every week.  - Recommend to START Trulicity  0.75 mg weekly. Patient was extensively  educated on storage, administration, and AE.   - Recommend to continue metformin  IR 1000 mg BID - Recommend to check glucose twice daily: fasting and 2-hr PPG . Counseled patient to bring glucometer or BG log to every appointment. - Next A1C due November 2025      Heart Failure: - Currently appropriately managed, however patient is not taking her medications as prescribed. Appears euvolemic on exam today. NYHA Class II-III symptoms given reported ShOB overnight. Will switch her from losartan  to valsartan  as she describes a side effect of hand numbness which is preventing her from taking losartan  as prescribed. She does not give a clear reason for nonadherence to her other medications. She states they have been affordable, but will transfer her to [facility] pharmacy to reduce cost of self-pay medications further. Emphasized importance of adherence to medications given dx of HF and  currently reported symptoms of ShOB overnight.  - Reviewed appropriate blood pressure monitoring technique and reviewed goal blood pressure - Reviewed to weigh daily and when to contact cardiology with weight gain - Recommend to make the following changes:  GDMT: ACEi/ARB/ARNI: STOP losartan , START valsartan  80 mg daily. Will repeat BMP in ~4 weeks. SGLT2i: continue Farxiga  10 mg daily Beta blocker: continue carvedilol  6.25 mg BID ( Mineralocorticoid Receptor Antagonist: continue spironolactone  25 mg daily Diuretic regimen: none    Written patient instructions provided. Patient verbalized understanding of treatment plan.   Follow Up Plan:  Pharmacist in person 11/11/23 PCP clinic visit 12/10/23   Lorain Baseman, PharmD [facility] [phone]

## 2023-11-25 ENCOUNTER — Other Ambulatory Visit: Payer: Self-pay

## 2023-12-10 ENCOUNTER — Other Ambulatory Visit (HOSPITAL_COMMUNITY): Payer: Self-pay

## 2023-12-10 ENCOUNTER — Telehealth: Payer: Self-pay | Admitting: Emergency Medicine

## 2023-12-10 ENCOUNTER — Ambulatory Visit: Payer: Self-pay | Admitting: Nurse Practitioner

## 2023-12-10 NOTE — Telephone Encounter (Signed)
 Faxed updated med list, allergies, and health conditions as requested

## 2023-12-10 NOTE — Progress Notes (Deleted)
 12/10/2023 Name: Nicole Parker MRN: [id number] DOB: 07-Feb-1980  No chief complaint on file.   Nicole Parker is a 43 y.o. year old female who was referred for medication management by their primary care provider, Oley Bascom RAMAN, NP. They presented for a face to face visit today.   They were referred to the pharmacist by their PCP for assistance in managing diabetes. 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    Subjective: Patient was last seen by PCP, Bascom Oley, NP, on 09/08/23. At last visit, BP was elevated to 152/81 mmHg, HR 88 bpm. A1C increased from 9.5% to 10.3%. She was referred to pharmacy for diabetes management. Patient has also historically followed with cardiology for HF management. She was seen for an in-person pharmacy visit on 10/01/23. She was agreeable to initiating Trulicity . We also switched losartan  to valsartan  due to patient reported side effect of hand numbness. Overall, patient reported poor medication adherence, but was willing to restart daily adherence.  Today, patient presents in  good spirits and presents without  any assistance. Patient brought her medication bottles with her today for review.   Farxiga  re-enrollment? BMP today LIBERATE?  Care Team: Primary Care Provider: Oley Bascom RAMAN, NP ; Next Scheduled Visit: 12/10/23 Cardiologist: Dr. Francyne; Next Scheduled Visit: needs to be scheduled ~ Oct 2025  Medication Access/Adherence  Current Pharmacy:  [facility] [address] [place] - [address]  [place] [address] Phone: [phone] Fax: [phone]  [facility] - [place], [place] - [address] [place] [address] Phone: [phone] Fax: [phone]  [facility] - [facility] [address] [place] [address] Phone: [phone] Fax:  [phone]   Patient reports affordability concerns with their medications: No  - reports they are affordable at [facility]. Patient reports access/transportation concerns to their pharmacy: No  - reports she could go to [facility] pharmacy to pick up medications if the prices would be reduced.  Patient reports adherence concerns with their medications:  Yes    Fill history - carvedilol  last filled Apr 2025 for 30ds - Farxiga  - current supply via [facility] - losartan  - filled 08/04/23 for 90ds but she brought bottle with pills from April 2025 - metformin  - filled April 2025 for 30ds - spironolactone  - filled May 2025 for 30ds  Reports that she usually takes her medications at night, but today she took them this morning. Forgets to take medications 2-3 times per week.  Reports that she gets numbness in her hand when she takes losartan , so that is one reason she does not take this medication regularly.  Reports that she only takes carvedilol  and metformin  once daily, if she takes them.    Diabetes:  Current medications: metformin  IR 1000 mg BID, Farxiga  10 mg daily, Trulicity  0.75 mg weekly Medications tried in the past: N/A  Current glucose readings:  Using glucometer; testing one time per week - recalls reading of 175 mg/dL in the afternoon.   Patient denies hypoglycemic s/sx including dizziness, shakiness, sweating. Patient denies hyperglycemic symptoms including polyuria, polydipsia, polyphagia, nocturia, blurred vision. Endorses neuropathy (needle poking in fingers and arms).   Current meal patterns: Eating two meals per day - Breakfast: Eats omelet around 10:30 - Supper: In the evening has larger meal rice, stew. - Snacks: no snacks - Drinks: Reports  that she drinks > 8 bottles water per day. Has occasional Dr. Nunzio (estimates ~1 every 4 days).   Current physical activity: none currently  Current medication access support: No insurance  Heart Failure (EF 45-50% in Oct 2024):  Current  medications:  ACEi/ARB/ARNI: losartan  50 mg daily (not taking consistently) SGLT2i: Farxiga  10 mg daily Beta blocker: carvedilol  6.25 mg BID (not taking consistently and/or once daily) Mineralocorticoid Receptor Antagonist: spironolactone  25 mg daily (not taking consistently Diuretic regimen: none  Current home blood pressure readings: Reports SBP is always in the 160-170s  Patient reports volume overload signs or symptoms including shortness of breath overnight when she is laying down. Reports that she sleeps on 3 pillows at night. No LE on exam today.   Patient reports that she is having HA at home when she does not sleep well or is frustrated. Denies chest pain .  Current medication access support:  - Patient approved for AZMe Farxiga  through 02/04/24 - Reports that she would be willing and able to pick up medications from [facility] via [facility]   Objective:  BP Readings from Last 3 Encounters:  10/01/23 (!) 156/84  09/08/23 (!) 152/81  05/09/23 (!) 164/94    Lab Results  Component Value Date   HGBA1C 10.3 (A) 09/08/2023   HGBA1C 9.5 (A) 05/09/2023   HGBA1C 9.3 (A) 02/07/2023       Latest Ref Rng & Units 05/09/2023    3:55 PM 09/18/2022    4:15 PM 06/12/2022    8:59 AM  BMP  Glucose 70 - 99 mg/dL 784  765  867   BUN 6 - 24 mg/dL 17  22  17    Creatinine 0.57 - 1.00 mg/dL 9.10  9.34  9.34   BUN/Creat Ratio 9 - 23 19  34  26   Sodium 134 - 144 mmol/L 138  138  137   Potassium 3.5 - 5.2 mmol/L 4.1  3.5  4.2   Chloride 96 - 106 mmol/L 101  98  101   CO2 20 - 29 mmol/L 20  23  22    Calcium  8.7 - 10.2 mg/dL 9.3  8.7  9.3     Lab Results  Component Value Date   CHOL 173 06/12/2022   HDL 44 06/12/2022   LDLCALC 104 (H) 06/12/2022   TRIG 143 06/12/2022   CHOLHDL 3.9 06/12/2022    Medications Reviewed Today   Medications were not reviewed in this encounter       Assessment/Plan:   Diabetes: - Currently uncontrolled with most recent A1C of 10.3% above goal <7% and worsened  from 9.5%. Medication adherence appears poor. Patient is a good candidate for treatment with GLP-1RA, like Trulicity , which is available via [facility] supply. She is agreeable to starting this medication and resuming metformin  as prescribed.  - Last UACR 09/08/23: 102 mg/g (previous 40 mg/g) - Patient denies personal or family history of multiple endocrine neoplasia type 2, medullary thyroid cancer; personal history of pancreatitis or gallbladder disease. - Reviewed long term cardiovascular and renal outcomes of uncontrolled blood sugar - Reviewed goal A1c, goal fasting, and goal 2 hour post prandial glucose - Reviewed dietary modifications including  utilizing the healthy plate method, limiting portion size of carbohydrate foods, increasing intake of protein and non-starchy vegetables. Counseled patient to stay hydrated with water throughout the day. - Reviewed lifestyle modifications including: aiming for 150 minutes of moderate intensity exercise every week.  - Recommend to START Trulicity  0.75 mg weekly. Patient was extensively  educated on storage, administration, and AE.   - Recommend to continue metformin  IR 1000 mg BID - Recommend to check glucose twice daily: fasting and 2-hr PPG . Counseled patient to bring glucometer or BG log to every appointment. - Next A1C due November 2025      Heart Failure: - Currently appropriately managed, however patient is not taking her medications as prescribed. Appears euvolemic on exam today. NYHA Class II-III symptoms given reported ShOB overnight. Will switch her from losartan  to valsartan  as she describes a side effect of hand numbness which is preventing her from taking losartan  as prescribed. She does not give a clear reason for nonadherence to her other medications. She states they have been affordable, but will transfer her to [facility] pharmacy to reduce cost of self-pay medications further. Emphasized importance of adherence to medications given dx of HF and  currently reported symptoms of ShOB overnight.  - Reviewed appropriate blood pressure monitoring technique and reviewed goal blood pressure - Reviewed to weigh daily and when to contact cardiology with weight gain - Recommend to make the following changes:  GDMT: ACEi/ARB/ARNI: STOP losartan , START valsartan  80 mg daily. Will repeat BMP in ~4 weeks. SGLT2i: continue Farxiga  10 mg daily Beta blocker: continue carvedilol  6.25 mg BID ( Mineralocorticoid Receptor Antagonist: continue spironolactone  25 mg daily Diuretic regimen: none    Written patient instructions provided. Patient verbalized understanding of treatment plan.   Follow Up Plan:  Pharmacist in person 11/11/23 PCP clinic visit 12/10/23   Lorain Baseman, PharmD [facility] [phone]

## 2023-12-11 ENCOUNTER — Telehealth: Payer: Self-pay | Admitting: Nurse Practitioner

## 2023-12-11 NOTE — Telephone Encounter (Signed)
 Copied from CRM [id number]. Topic: Appointments - Scheduling Inquiry for Clinic >> Dec 10, 2023  2:14 PM Avram MATSU wrote: Reason for CRM: patient needs her appt reschedule today with the pharmacist, please advise [phone]

## 2024-01-09 ENCOUNTER — Telehealth: Payer: Self-pay

## 2024-01-09 ENCOUNTER — Other Ambulatory Visit: Payer: Self-pay

## 2024-01-09 NOTE — Progress Notes (Signed)
 Attempted to contact patient for scheduled appointment for medication management. Left HIPAA compliant message for patient to return my call at their convenience.   Lorain Baseman, PharmD [facility] [phone]

## 2024-01-09 NOTE — Progress Notes (Unsigned)
 01/09/2024 Name: Nicole Parker MRN: [id number] DOB: 1980/12/13  No chief complaint on file.   Nicole Parker is a 43 y.o. year old female who was referred for medication management by their primary care provider, Oley Bascom RAMAN, NP. They presented for a face to face visit today.   They were referred to the pharmacist by their PCP for assistance in managing diabetes. 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    Subjective: Patient was last seen by PCP, Bascom Oley, NP, on 09/08/23. At last visit, BP was elevated to 152/81 mmHg, HR 88 bpm. A1C increased from 9.5% to 10.3%. She was referred to pharmacy for diabetes management. Patient has also historically followed with cardiology for HF management. She was seen for an in-person pharmacy visit on 10/01/23. She was agreeable to initiating Trulicity . We also switched losartan  to valsartan  due to patient reported side effect of hand numbness. Overall, patient reported poor medication adherence, but was willing to restart daily adherence.  Today, patient presents in  good spirits and presents without  any assistance. Patient brought her medication bottles with her today for review.   Farxiga  re-enrollment? BMP today LIBERATE?  Care Team: Primary Care Provider: Oley Bascom RAMAN, NP ; Next Scheduled Visit: 12/10/23 Cardiologist: Dr. Francyne; Next Scheduled Visit: needs to be scheduled ~ Oct 2025  Medication Access/Adherence  Current Pharmacy:  [facility] [address] [place] - [address] [place] [address] Phone: [phone] Fax: [phone]  [facility] - [place], [place] - [address] [place] [address] Phone: [phone] Fax: [phone]  [facility] - [facility] [address] [place] [address] Phone: [phone] Fax:  [phone]   Patient reports affordability concerns with their medications: No  - reports they are affordable at [facility]. Patient reports access/transportation concerns to their pharmacy: No  - reports she could go to [facility] pharmacy to pick up medications if the prices would be reduced.  Patient reports adherence concerns with their medications:  Yes    Fill history - carvedilol  last filled Apr 2025 for 30ds - Farxiga  - current supply via [facility] - losartan  - filled 08/04/23 for 90ds but she brought bottle with pills from April 2025 - metformin  - filled April 2025 for 30ds - spironolactone  - filled May 2025 for 30ds  Reports that she usually takes her medications at night, but today she took them this morning. Forgets to take medications 2-3 times per week.  Reports that she gets numbness in her hand when she takes losartan , so that is one reason she does not take this medication regularly.  Reports that she only takes carvedilol  and metformin  once daily, if she takes them.    Diabetes:  Current medications: metformin  IR 1000 mg BID, Farxiga  10 mg daily, Trulicity  0.75 mg weekly Medications tried in the past: N/A  Current glucose readings:  Using glucometer; testing one time per week - recalls reading of 175 mg/dL in the afternoon.   Patient denies hypoglycemic s/sx including dizziness, shakiness, sweating. Patient denies hyperglycemic symptoms including polyuria, polydipsia, polyphagia, nocturia, blurred vision. Endorses neuropathy (needle poking in fingers and arms).   Current meal patterns: Eating two meals per day - Breakfast: Eats omelet around 10:30 - Supper: In the evening has larger meal rice, stew. - Snacks: no snacks - Drinks: Reports  that she drinks > 8 bottles water per day. Has occasional Dr. Nunzio (estimates ~1 every 4 days).   Current physical activity: none currently  Current medication access support: No insurance  Heart Failure (EF 45-50% in Oct 2024):  Current  medications:  ACEi/ARB/ARNI: losartan  50 mg daily (not taking consistently) SGLT2i: Farxiga  10 mg daily Beta blocker: carvedilol  6.25 mg BID (not taking consistently and/or once daily) Mineralocorticoid Receptor Antagonist: spironolactone  25 mg daily (not taking consistently Diuretic regimen: none  Current home blood pressure readings: Reports SBP is always in the 160-170s  Patient reports volume overload signs or symptoms including shortness of breath overnight when she is laying down. Reports that she sleeps on 3 pillows at night. No LE on exam today.   Patient reports that she is having HA at home when she does not sleep well or is frustrated. Denies chest pain .  Current medication access support:  - Patient approved for [facility] Farxiga  through 02/04/24 - Reports that she would be willing and able to pick up medications from [facility] via [facility]   Objective:  BP Readings from Last 3 Encounters:  10/01/23 (!) 156/84  09/08/23 (!) 152/81  05/09/23 (!) 164/94    Lab Results  Component Value Date   HGBA1C 10.3 (A) 09/08/2023   HGBA1C 9.5 (A) 05/09/2023   HGBA1C 9.3 (A) 02/07/2023       Latest Ref Rng & Units 05/09/2023    3:55 PM 09/18/2022    4:15 PM 06/12/2022    8:59 AM  BMP  Glucose 70 - 99 mg/dL 784  765  867   BUN 6 - 24 mg/dL 17  22  17    Creatinine 0.57 - 1.00 mg/dL 9.10  9.34  9.34   BUN/Creat Ratio 9 - 23 19  34  26   Sodium 134 - 144 mmol/L 138  138  137   Potassium 3.5 - 5.2 mmol/L 4.1  3.5  4.2   Chloride 96 - 106 mmol/L 101  98  101   CO2 20 - 29 mmol/L 20  23  22    Calcium  8.7 - 10.2 mg/dL 9.3  8.7  9.3     Lab Results  Component Value Date   CHOL 173 06/12/2022   HDL 44 06/12/2022   LDLCALC 104 (H) 06/12/2022   TRIG 143 06/12/2022   CHOLHDL 3.9 06/12/2022    Medications Reviewed Today   Medications were not reviewed in this encounter       Assessment/Plan:   Diabetes: - Currently uncontrolled with most recent A1C of 10.3% above goal <7% and worsened  from 9.5%. Medication adherence appears poor. Patient is a good candidate for treatment with GLP-1RA, like Trulicity , which is available via [facility] supply. She is agreeable to starting this medication and resuming metformin  as prescribed.  - Last UACR 09/08/23: 102 mg/g (previous 40 mg/g) - Patient denies personal or family history of multiple endocrine neoplasia type 2, medullary thyroid cancer; personal history of pancreatitis or gallbladder disease. - Reviewed long term cardiovascular and renal outcomes of uncontrolled blood sugar - Reviewed goal A1c, goal fasting, and goal 2 hour post prandial glucose - Reviewed dietary modifications including  utilizing the healthy plate method, limiting portion size of carbohydrate foods, increasing intake of protein and non-starchy vegetables. Counseled patient to stay hydrated with water throughout the day. - Reviewed lifestyle modifications including: aiming for 150 minutes of moderate intensity exercise every week.  - Recommend to START Trulicity  0.75 mg weekly. Patient was extensively  educated on storage, administration, and AE.   - Recommend to continue metformin  IR 1000 mg BID - Recommend to check glucose twice daily: fasting and 2-hr PPG . Counseled patient to bring glucometer or BG log to every appointment. - Next A1C due November 2025      Heart Failure: - Currently appropriately managed, however patient is not taking her medications as prescribed. Appears euvolemic on exam today. NYHA Class II-III symptoms given reported ShOB overnight. Will switch her from losartan  to valsartan  as she describes a side effect of hand numbness which is preventing her from taking losartan  as prescribed. She does not give a clear reason for nonadherence to her other medications. She states they have been affordable, but will transfer her to [facility] pharmacy to reduce cost of self-pay medications further. Emphasized importance of adherence to medications given dx of HF and  currently reported symptoms of ShOB overnight.  - Reviewed appropriate blood pressure monitoring technique and reviewed goal blood pressure - Reviewed to weigh daily and when to contact cardiology with weight gain - Recommend to make the following changes:  GDMT: ACEi/ARB/ARNI: STOP losartan , START valsartan  80 mg daily. Will repeat BMP in ~4 weeks. SGLT2i: continue Farxiga  10 mg daily Beta blocker: continue carvedilol  6.25 mg BID ( Mineralocorticoid Receptor Antagonist: continue spironolactone  25 mg daily Diuretic regimen: none    Written patient instructions provided. Patient verbalized understanding of treatment plan.   Follow Up Plan:  Pharmacist in person 11/11/23 PCP clinic visit 12/10/23   Lorain Baseman, PharmD [facility] [phone]

## 2024-01-14 ENCOUNTER — Ambulatory Visit: Payer: Self-pay | Admitting: Nurse Practitioner

## 2024-01-16 ENCOUNTER — Telehealth: Payer: Self-pay | Admitting: Pharmacy Technician

## 2024-01-16 NOTE — Progress Notes (Signed)
° °  01/16/2024  Patient ID: Nicole Parker, female   DOB: 11/21/1980, 43 y.o.   MRN: [id number]  Patient engaged with clinical pharmacist for management of diabetes on 10/01/2023. Outreach by [employer] technician was requested.   Outreached patient to discuss diabetes medication management. Left voicemail for patient to return my call at their convenience with the assistance of [employer] whose id is [phone].  Giuliano Preece, CPhT [facility] Office: [phone] Email: [email or web address]

## 2024-01-20 ENCOUNTER — Other Ambulatory Visit: Payer: Self-pay

## 2024-01-20 ENCOUNTER — Other Ambulatory Visit (HOSPITAL_COMMUNITY): Payer: Self-pay

## 2024-01-20 ENCOUNTER — Telehealth: Payer: Self-pay | Admitting: Pharmacy Technician

## 2024-01-20 NOTE — Progress Notes (Signed)
 01/20/2024 Name: Nicole Parker MRN: [id number] DOB: 04-16-1980  Patient is appearing for a follow-up visit with the population health pharmacy technician. Last engaged with the clinical pharmacist to discuss diabetes and heart failure on 10/01/23. Contacted patient today to discuss diabetes, medication adherence, and medication access.   Plan from last clinical pharmacist appointment:  Diabetes: - Currently uncontrolled with most recent A1C of 10.3% above goal <7% and worsened from 9.5%. Medication adherence appears poor. Patient is a good candidate for treatment with GLP-1RA, like Trulicity , which is available via [facility] supply. She is agreeable to starting this medication and resuming metformin  as prescribed.  - Last UACR 09/08/23: 102 mg/g (previous 40 mg/g) - Patient denies personal or family history of multiple endocrine neoplasia type 2, medullary thyroid cancer; personal history of pancreatitis or gallbladder disease. - Reviewed long term cardiovascular and renal outcomes of uncontrolled blood sugar - Reviewed goal A1c, goal fasting, and goal 2 hour post prandial glucose - Reviewed dietary modifications including  utilizing the healthy plate method, limiting portion size of carbohydrate foods, increasing intake of protein and non-starchy vegetables. Counseled patient to stay hydrated with water throughout the day. - Reviewed lifestyle modifications including: aiming for 150 minutes of moderate intensity exercise every week.  - Recommend to START Trulicity  0.75 mg weekly. Patient was extensively educated on storage, administration, and AE.   - Recommend to continue metformin  IR 1000 mg BID - Recommend to check glucose twice daily: fasting and 2-hr PPG . Counseled patient to bring glucometer or BG log to every appointment. - Next A1C due November 2025   Heart Failure: - Currently appropriately managed, however patient is not taking her medications as prescribed. Appears euvolemic on exam  today. NYHA Class II-III symptoms given reported ShOB overnight. Will switch her from losartan  to valsartan  as she describes a side effect of hand numbness which is preventing her from taking losartan  as prescribed. She does not give a clear reason for nonadherence to her other medications. She states they have been affordable, but will transfer her to [facility] pharmacy to reduce cost of self-pay medications further. Emphasized importance of adherence to medications given dx of HF and currently reported symptoms of ShOB overnight.  - Reviewed appropriate blood pressure monitoring technique and reviewed goal blood pressure - Reviewed to weigh daily and when to contact cardiology with weight gain - Recommend to make the following changes:  GDMT: ACEi/ARB/ARNI: STOP losartan , START valsartan  80 mg daily. Will repeat BMP in ~4 weeks. SGLT2i: continue Farxiga  10 mg daily Beta blocker: continue carvedilol  6.25 mg BID ( Mineralocorticoid Receptor Antagonist: continue spironolactone  25 mg daily Diuretic regimen: none  Written patient instructions provided. Patient verbalized understanding of treatment plan.  Follow Up Plan:  Pharmacist in person 11/11/23 PCP clinic visit 12/10/23(copy/paste from last note)   Medication Adherence Barriers Identified:  Patient made recommended medication changes per plan: Yes Patient informs she has the following medications on hand to take with the exception of Trulicity . She is out of that medication. She informs she takes Metformin  1000mg  twice a day, Carvedilol  6.25mg  twice a day, Valsartan  80mg  daily, Spironolactone  25mg  daily, Farxiga  10mg  daily which she gets thru patient assistance and uses Trulicity  once a week. Access issues with any new medication or testing device: Yes Patient informs she has been out of Trulicity   since around Dec 7. She informs she has not felt well enough to go pick up the medication but is feeling somewhat better and can try and go pick  it up.  Per Dr Annemarie, Carvedilol , Trulicity , Metformin , Spironolactone  and Valsartan  were all filled for 30 day supply on 11/25/23.  Patient is checking blood sugars as prescribed: No Patient is not check blood sugars. Patient does have a meter at home and informs it does work properly. Encourage patient to check blood sugar as this would help providers treat her condition better. She verbalized understanding Patient informs she has NOT been filling well. She informs she has had a cough for a week and a half now. She informs having pain/pressure on left chest near lung area and would like to be seen. Reached out to embedded PharmD and was Advised to have patient to call into the office at the conclusion of this call to inquire about an appointment. Advised that there is a spanish option when she calls in to assist. Patient informs she will call the office.   Medication Adherence Barriers Addressed/Actions Taken:  Reviewed medication changes per plan from last clinical pharmacist note Medication Access for Trulicity  Will discuss medication access concerns with pharmacist Contacted pharmacy regarding refills Spoke to pharmacy staff at [facility] who informs patient should be out of all the aforementioned medications as they were all filled last on 10/21 for a 30 day supply. They will refill these medications and patient will receive a text when she can pick them up. Educated patient to contact pharmacy regarding refills.  Reviewed instructions for monitoring blood sugars at home and reminded patient to keep a written log to review with pharmacist Reminded patient of date/time of upcoming clinical pharmacist follow up and any upcoming PCP/specialists visits. Patient denies transportation barriers to the appointment. Yes  Next clinical pharmacist appointment is scheduled for: TBD, CPhT will outreach 1 week post PCP appointment to schedule patient with PharmD.  Arrianna Catala, CPhT [facility]  [facility] Office: [phone] Email: [email or web address]

## 2024-01-21 ENCOUNTER — Other Ambulatory Visit: Payer: Self-pay

## 2024-02-12 ENCOUNTER — Ambulatory Visit: Payer: Self-pay | Admitting: Nurse Practitioner

## 2024-02-16 ENCOUNTER — Encounter: Payer: Self-pay | Admitting: Nurse Practitioner

## 2024-02-16 ENCOUNTER — Telehealth: Payer: Self-pay | Admitting: Pharmacy Technician

## 2024-02-16 NOTE — Progress Notes (Signed)
" ° °  02/16/2024  Patient ID: Nicole Parker, female   DOB: 10-05-1980, 44 y.o.   MRN: [id number]  Patient engaged with clinical pharmacist for management of diabetes on 10/01/2023. Outreach by [employer] technician was requested.   Outreached patient to discuss diabetes and remind patient of appointment with pharmacist. Left voicemail for patient to return my call at their convenience.    Qamar Aughenbaugh, CPhT  [facility] Office: [phone] Email: [email or web address]  "

## 2024-02-25 ENCOUNTER — Telehealth: Payer: Self-pay | Admitting: Pharmacy Technician

## 2024-02-25 NOTE — Progress Notes (Signed)
" ° °  02/25/2024  Patient ID: Nicole Parker, female   DOB: 02/23/1980, 44 y.o.   MRN: [id number]  Patient engaged with clinical pharmacist for management of diabetes on 10/01/2023. Outreach by [employer] technician was requested.   Outreached patient to discuss diabetes medication management. Left voicemail for patient to return my call at their convenience with assistance of [facility] Eric whose id is [phone].  Tavius Turgeon, CPhT [facility] Office: [phone] Email: [email or web address]    "

## 2024-03-02 ENCOUNTER — Telehealth: Payer: Self-pay | Admitting: Pharmacy Technician

## 2024-03-02 ENCOUNTER — Other Ambulatory Visit: Payer: Self-pay

## 2024-03-02 NOTE — Progress Notes (Signed)
 "  03/02/2024 Name: Nicole Parker MRN: [id number] DOB: Jan 25, 1981  Patient is appearing for a follow-up visit with the population health pharmacy technician. Last engaged with the clinical pharmacist to discuss diabetes on 10/01/2023. Contacted patient today to discuss diabetes, medication adherence, and medication access with assistance of [name] whose id is [phone].  Plan from last clinical pharmacist appointment:  Diabetes: - Currently uncontrolled with most recent A1C of 10.3% above goal <7% and worsened from 9.5%. Medication adherence appears poor. Patient is a good candidate for treatment with GLP-1RA, like Trulicity , which is available via [facility] supply. She is agreeable to starting this medication and resuming metformin  as prescribed.  - Last UACR 09/08/23: 102 mg/g (previous 40 mg/g) - Patient denies personal or family history of multiple endocrine neoplasia type 2, medullary thyroid cancer; personal history of pancreatitis or gallbladder disease. - Reviewed long term cardiovascular and renal outcomes of uncontrolled blood sugar - Reviewed goal A1c, goal fasting, and goal 2 hour post prandial glucose - Reviewed dietary modifications including  utilizing the healthy plate method, limiting portion size of carbohydrate foods, increasing intake of protein and non-starchy vegetables. Counseled patient to stay hydrated with water throughout the day. - Reviewed lifestyle modifications including: aiming for 150 minutes of moderate intensity exercise every week.  - Recommend to START Trulicity  0.75 mg weekly. Patient was extensively educated on storage, administration, and AE.   - Recommend to continue metformin  IR 1000 mg BID - Recommend to check glucose twice daily: fasting and 2-hr PPG . Counseled patient to bring glucometer or BG log to every appointment. - Next A1C due November 2025   Heart Failure: - Currently appropriately managed, however patient is not taking her  medications as prescribed. Appears euvolemic on exam today. NYHA Class II-III symptoms given reported ShOB overnight. Will switch her from losartan  to valsartan  as she describes a side effect of hand numbness which is preventing her from taking losartan  as prescribed. She does not give a clear reason for nonadherence to her other medications. She states they have been affordable, but will transfer her to [facility] pharmacy to reduce cost of self-pay medications further. Emphasized importance of adherence to medications given dx of HF and currently reported symptoms of ShOB overnight.  - Reviewed appropriate blood pressure monitoring technique and reviewed goal blood pressure - Reviewed to weigh daily and when to contact cardiology with weight gain - Recommend to make the following changes:  GDMT: ACEi/ARB/ARNI: STOP losartan , START valsartan  80 mg daily. Will repeat BMP in ~4 weeks. SGLT2i: continue Farxiga  10 mg daily Beta blocker: continue carvedilol  6.25 mg BID ( Mineralocorticoid Receptor Antagonist: continue spironolactone  25 mg daily Diuretic regimen: none      Written patient instructions provided. Patient verbalized understanding of treatment plan.    Follow Up Plan:  Pharmacist in person 11/11/23 PCP clinic visit 12/10/23(copy/paste from last note)   Medication Adherence Barriers Identified:  Patient made recommended medication changes per plan: No Patient informs she takes Carvedilol  twice a day and she has the medication on hand to take. She informs she takes Valsartan  daily and she has the medication on hand to take. She informs she takes Spironolactone  daily but she does NOT have any more to take and needs a refill. She informs she only takes Metformin  once a day (ordered twice a day) because I do not feel like I need to take it twice a day because I also take an injection. She informs she needs a refill of Metformin .  She informs she takes Trulicity  weekly but she missed last weeks dose  because she did not have any to use. She informs she also takes Farxiga  daily and get that thru the mail and she has some on hand to take. She informs she no longer takes Omeprazole . Access issues with any new medication or testing device: Yes Patient informs she needs refills on Spironolactone , Metformin  and Trulicity . Will check with PharmD about calling pharmacy to refill these as patient has an appointment with PharmD tomorrow. Patient informs she has Valsartan  and Carvedilol  but it appears they were last filled on 01/21/24 for 30 days so technically patient should be out of these 2 medications as well.  Patient is checking blood sugars as prescribed: Yes Patient informs she checks her blood sugar but her log was at home and she was at work. She does remember that yesterday around 10:40am after she ate her blood sugar was 130.  Patient is checking blood pressure readings as prescribed: Yes Patient informs she checks her blood pressure but her log was at home and she was at work. She informs she remembers readings of 150, 140 but could not recall bottom number as she did not realize she needed to be aware of that number as well.   Medication Adherence Barriers Addressed/Actions Taken:  Reviewed medication changes per plan from last clinical pharmacist note Medication Access for Metformin , Trulicity  and Spironolactone  Will discuss medication access concerns with pharmacist Contacted pharmacy regarding refills Spoke to [facility] at the pharmacy She informs patient will have to complete [facility] paperwork when she picks up the medication. [facility] was able to fill Trulicity , Metformin  and Spironolactone  at no charge to the patient. Reviewed instructions for monitoring blood sugars and blood pressures at home and reminded patient to keep a written log to review with pharmacist Reminded patient of date/time of upcoming clinical pharmacist follow up and any upcoming PCP/specialists visits. Patient denies  transportation barriers to the appointment. Yes  Next clinical pharmacist appointment is scheduled for: 03/03/2024   Rebecca Motta, CPhT [facility] Office: [phone] Email: [email or web address]  "

## 2024-03-03 ENCOUNTER — Ambulatory Visit: Payer: Self-pay

## 2024-03-04 ENCOUNTER — Encounter: Payer: Self-pay | Admitting: Nurse Practitioner
